# Patient Record
Sex: Female | Born: 1935 | Race: Black or African American | Hispanic: No | Marital: Married | Smoking: Former smoker
Health system: Southern US, Community
[De-identification: ages and names within clinical notes are randomized; demographics above are authoritative.]

## PROBLEM LIST (undated history)

## (undated) DIAGNOSIS — I10 Essential (primary) hypertension: Secondary | ICD-10-CM

## (undated) DIAGNOSIS — I509 Heart failure, unspecified: Secondary | ICD-10-CM

## (undated) DIAGNOSIS — E039 Hypothyroidism, unspecified: Secondary | ICD-10-CM

## (undated) DIAGNOSIS — I2510 Atherosclerotic heart disease of native coronary artery without angina pectoris: Secondary | ICD-10-CM

## (undated) HISTORY — PX: OTHER SURGICAL HISTORY: SHX169

## (undated) HISTORY — PX: CARDIAC CATHETERIZATION: SHX172

## (undated) HISTORY — PX: COLON SURGERY: SHX602

---

## 2007-11-09 HISTORY — PX: CORONARY ARTERY BYPASS GRAFT: SHX141

## 2014-01-17 DIAGNOSIS — J309 Allergic rhinitis, unspecified: Secondary | ICD-10-CM | POA: Insufficient documentation

## 2014-01-17 DIAGNOSIS — J45909 Unspecified asthma, uncomplicated: Secondary | ICD-10-CM | POA: Insufficient documentation

## 2014-02-11 DIAGNOSIS — Z951 Presence of aortocoronary bypass graft: Secondary | ICD-10-CM | POA: Insufficient documentation

## 2015-10-10 DIAGNOSIS — M545 Low back pain, unspecified: Secondary | ICD-10-CM | POA: Insufficient documentation

## 2016-03-25 ENCOUNTER — Other Ambulatory Visit: Payer: Self-pay | Admitting: Family Medicine

## 2016-03-25 DIAGNOSIS — E2839 Other primary ovarian failure: Secondary | ICD-10-CM

## 2016-04-14 ENCOUNTER — Ambulatory Visit
Admission: RE | Admit: 2016-04-14 | Discharge: 2016-04-14 | Disposition: A | Discharge status: Home / Self Care | Payer: Medicare Other | Source: Ambulatory Visit | Attending: Family Medicine | Admitting: Family Medicine

## 2016-04-14 DIAGNOSIS — M8580 Other specified disorders of bone density and structure, unspecified site: Secondary | ICD-10-CM | POA: Insufficient documentation

## 2016-04-14 DIAGNOSIS — E2839 Other primary ovarian failure: Secondary | ICD-10-CM

## 2016-05-28 ENCOUNTER — Ambulatory Visit: Payer: Medicare Other | Attending: Family Medicine | Admitting: Physical Therapy

## 2016-05-28 ENCOUNTER — Encounter: Payer: Self-pay | Admitting: Physical Therapy

## 2016-05-28 DIAGNOSIS — M5441 Lumbago with sciatica, right side: Secondary | ICD-10-CM | POA: Diagnosis not present

## 2016-05-28 DIAGNOSIS — R262 Difficulty in walking, not elsewhere classified: Secondary | ICD-10-CM

## 2016-05-28 NOTE — Therapy (Signed)
[facility] [address] [place], [place], [address] Phone: [phone]   Fax:  [phone]  Physical Therapy Evaluation  Patient Details  Name: Katie MorinMary Agnes Foot MRN: [id number] Date of Birth: [birth year] Referring Provider: Lanelle BalK. Briscoe  Encounter Date: 05/28/2016      PT End of Session - 05/28/16 0911    Visit Number 1   Date for PT Re-Evaluation 07/29/16   PT Start Time 0845   PT Stop Time 0938   PT Time Calculation (min) 53 min   Activity Tolerance Patient tolerated treatment well   Behavior During Therapy [facility] for tasks assessed/performed      History reviewed. No pertinent past medical history.  History reviewed. No pertinent past surgical history.  There were no vitals filed for this visit.       Subjective Assessment - 05/28/16 0848    Subjective Patient reports low back pain for about a year.  She reports a "slipped disc".  She reports her husband was having health issues and she was lifting him out of the car and hurt her back.     Limitations Lifting;Standing;Walking   How long can you stand comfortably? 5 minutes   How long can you walk comfortably? 1 minute, reports had to rest to walk 100 feet   Diagnostic tests x-rays   Patient Stated Goals have less pain   Currently in Pain? Yes   Pain Score 4    Pain Location Back   Pain Orientation Right;Lower   Pain Descriptors / Indicators Aching;Spasm;Sore   Pain Type Chronic pain   Pain Onset More than a month ago   Pain Frequency Constant   Aggravating Factors  standing, walking, doing ADL's pain will go to an 8/10   Pain Relieving Factors pain meds and heat, at best pain a 3/10   Effect of Pain on Daily Activities limits walking            [facility] PT Assessment - 05/28/16 0001    Assessment   Medical Diagnosis low back pain, difficulty walking   Referring Provider K. Briscoe   Onset Date/Surgical Date 04/28/16   Prior Therapy no   Precautions    Precautions None   Balance Screen   Has the patient fallen in the past 6 months No   Has the patient had a decrease in activity level because of a fear of falling?  No   Is the patient reluctant to leave their home because of a fear of falling?  No   Home Environment   Additional Comments does her own housework   Prior Function   Level of Independence Independent   Vocation Retired   Leisure no exercise   Posture/Postural Control   Posture Comments forward flexed trunk, right lateral shift which is the side of pain   ROM / Strength   AROM / PROM / Strength AROM;Strength   AROM   Overall AROM Comments lumbar ROM decreased 50% flexion and side bending, decreased 100% for extension, all motions increase pain   Strength   Overall Strength Comments 4-/5 with some pain   Flexibility   Soft Tissue Assessment /Muscle Length --  some HS and piriformis tightness   Palpation   Palpation comment she has significant spasms in the lumbar area, she is tender here and into the buttock and right SI, some tender in the right lateral thigh   Ambulation/Gait   Gait Comments walks without device, tends to use walls  to hold onto, forward flexed and right trunk lean, max walking distance is about 60 feet before she has to rest                   [facility] Adult PT Treatment/Exercise - 2016/06/02 0001    Modalities   Modalities Electrical Stimulation;Moist Heat   Moist Heat Therapy   Number Minutes Moist Heat 15 Minutes   Moist Heat Location Lumbar Spine   Electrical Stimulation   Electrical Stimulation Location lumbar spine   Electrical Stimulation Action IFC   Electrical Stimulation Parameters supine   Electrical Stimulation Goals Pain                PT Education - Jun 02, 2016 0910    Education provided Yes   Education Details Wms flexion exercises    Person(s) Educated Patient   Methods Explanation;Demonstration;Handout   Comprehension Verbalized understanding          PT  Short Term Goals - June 02, 2016 0913    PT SHORT TERM GOAL #1   Title independent with initial HEP   Time 2   Period Weeks   Status New           PT Long Term Goals - 02-Jun-2016 0913    PT LONG TERM GOAL #1   Title understand proper posture and body mechanics   Time 8   Period Weeks   Status New   PT LONG TERM GOAL #2   Title decrease pain 50%   Time 8   Period Weeks   Status New   PT LONG TERM GOAL #3   Title increase lumbar ROM 25%   Time 8   Period Weeks   Status New   PT LONG TERM GOAL #4   Title walk 200 feet without rest   Time 8   Period Weeks   Status New               Plan - 2016-06-02 0911    Clinical Impression Statement Patient has had low back pain for about a year after helping husband get out of the car.  She reports a "slipped disc".  She has significant mm spasms in the lumbar area and into the buttocks.  Reports difficulty walking > 60 feet.  Has forward flexed trunk and right shift   Rehab Potential Good   PT Frequency 2x / week   PT Duration 8 weeks   PT Treatment/Interventions ADLs/Self Care Home Management;Cryotherapy;Electrical Stimulation;Moist Heat;Therapeutic exercise;Therapeutic activities;Gait training;Ultrasound;Traction;Neuromuscular re-education;Patient/family education;Manual techniques   PT Next Visit Plan slowly add exercises and gait   Consulted and Agree with Plan of Care Patient      Patient will benefit from skilled therapeutic intervention in order to improve the following deficits and impairments:  Abnormal gait, Decreased activity tolerance, Decreased mobility, Decreased range of motion, Difficulty walking, Decreased strength, Impaired flexibility, Increased muscle spasms, Pain, Improper body mechanics, Postural dysfunction  Visit Diagnosis: Right-sided low back pain with right-sided sciatica - Plan: PT plan of care cert/re-cert  Difficulty in walking, not elsewhere classified - Plan: PT plan of care cert/re-cert       G-Codes - 06-02-16 0917    Functional Assessment Tool Used foto 75% limitation   Functional Limitation Other PT primary   Other PT Primary Current Status (Z6109) At least 60 percent but less than 80 percent impaired, limited or restricted   Other PT Primary Goal Status (U0454) At least 40 percent but less than 60 percent impaired, limited or  restricted       Problem List There are no active problems to display for this patient.   Jearld Lesch., PT 05/28/2016, 9:41 AM  [facility] [address] [place], [place], [address] Phone: [phone]   Fax:  [phone]  Name: Cristy Colmenares MRN: [id number] Date of Birth: 1933/03/16

## 2016-05-31 ENCOUNTER — Ambulatory Visit: Payer: Medicare Other | Admitting: Physical Therapy

## 2016-05-31 ENCOUNTER — Encounter: Payer: Self-pay | Admitting: Physical Therapy

## 2016-05-31 DIAGNOSIS — M5441 Lumbago with sciatica, right side: Secondary | ICD-10-CM | POA: Diagnosis not present

## 2016-05-31 DIAGNOSIS — R262 Difficulty in walking, not elsewhere classified: Secondary | ICD-10-CM

## 2016-05-31 NOTE — Therapy (Signed)
[facility] [place] [address] [place], [place], [address] Phone: [phone]   Fax:  [phone]  Physical Therapy Treatment  Patient Details  Name: Katie [name] MRN: [id number] Date of Birth: Jun 07, 1933 Referring Provider: Lanelle Bal  Encounter Date: 05/31/2016      PT End of Session - 05/31/16 0847    Visit Number 2   Date for PT Re-Evaluation 07/29/16   PT Start Time 0800   PT Stop Time 0900   PT Time Calculation (min) 60 min   Activity Tolerance Patient tolerated treatment well   Behavior During Therapy [facility] for tasks assessed/performed      History reviewed. No pertinent past medical history.  History reviewed. No pertinent surgical history.  There were no vitals filed for this visit.      Subjective Assessment - 05/31/16 0803    Subjective "Im having a little trouble with the last exercise" "Im learning how to move myself without hurting my back"   Currently in Pain? Yes   Pain Score 8    Pain Location Hip   Pain Orientation Right                         [facility] Adult PT Treatment/Exercise - 05/31/16 0001      Lumbar Exercises: Stretches   Passive Hamstring Stretch 4 reps;10 seconds   Double Knee to Chest Stretch 3 reps;10 seconds   Lower Trunk Rotation 2 reps   Piriformis Stretch 3 reps;10 seconds     Lumbar Exercises: Aerobic   Stationary Bike NuStep L2 x6 min      Lumbar Exercises: Supine   Ab Set 10 reps;2 seconds   Clam 10 reps   Clam Limitations red Tband    Bridge 5 reps;2 seconds   Straight Leg Raise 10 reps;1 second   Other Supine Lumbar Exercises Hooklying with ball squeeze x10     Modalities   Modalities Electrical Stimulation;Moist Heat     Moist Heat Therapy   Number Minutes Moist Heat 15 Minutes   Moist Heat Location Lumbar Spine     Electrical Stimulation   Electrical Stimulation Location lumbar spine   Electrical Stimulation Action IFC   Electrical  Stimulation Parameters supine   Electrical Stimulation Goals Pain                  PT Short Term Goals - 05/28/16 0913      PT SHORT TERM GOAL #1   Title independent with initial HEP   Time 2   Period Weeks   Status New           PT Long Term Goals - 05/28/16 0913      PT LONG TERM GOAL #1   Title understand proper posture and body mechanics   Time 8   Period Weeks   Status New     PT LONG TERM GOAL #2   Title decrease pain 50%   Time 8   Period Weeks   Status New     PT LONG TERM GOAL #3   Title increase lumbar ROM 25%   Time 8   Period Weeks   Status New     PT LONG TERM GOAL #4   Title walk 200 feet without rest   Time 8   Period Weeks   Status New               Plan - 05/31/16  1610    Clinical Impression Statement Pt tolerated today's interventions fair. Pt requires 4 rest breaks when on the nustep for 6 minutes. Pt with tight HS and piriformis on both sides. Very little ROM achieved with supine bridges due to weakness and pain. PT does not reports pain but facial expressions expressed otherwise during some of the supine interventions. Increase time needed to complete all of today's exercises.    Rehab Potential Good   PT Frequency 2x / week   PT Duration 8 weeks   PT Treatment/Interventions ADLs/Self Care Home Management;Cryotherapy;Electrical Stimulation;Moist Heat;Therapeutic exercise;Therapeutic activities;Gait training;Ultrasound;Traction;Neuromuscular re-education;Patient/family education;Manual techniques   PT Next Visit Plan slowly add exercises and gait      Patient will benefit from skilled therapeutic intervention in order to improve the following deficits and impairments:  Abnormal gait, Decreased activity tolerance, Decreased mobility, Decreased range of motion, Difficulty walking, Decreased strength, Impaired flexibility, Increased muscle spasms, Pain, Improper body mechanics, Postural dysfunction  Visit  Diagnosis: Right-sided low back pain with right-sided sciatica  Difficulty in walking, not elsewhere classified     Problem List There are no active problems to display for this patient.   Grayce Sessions 05/31/2016, 8:52 AM  [facility] [place] [address] [place], [place], [address] Phone: [phone]   Fax:  [phone]  Name: Katie [name] MRN: [id number] Date of Birth: 08-31-1933

## 2016-06-03 ENCOUNTER — Ambulatory Visit: Payer: Medicare Other | Admitting: Physical Therapy

## 2016-06-03 DIAGNOSIS — M5441 Lumbago with sciatica, right side: Secondary | ICD-10-CM

## 2016-06-03 DIAGNOSIS — R262 Difficulty in walking, not elsewhere classified: Secondary | ICD-10-CM

## 2016-06-03 NOTE — Therapy (Signed)
[place], [place], [address] Phone: [phone]   Fax:  [phone]  Physical Therapy Treatment  Patient Details  Name: Katie [name] MRN: [id number] Date of Birth: 21-Sep-1933 Referring Provider: Henrene Hawking  Encounter Date: 06/03/2016      PT End of Session - 06/03/16 0934    Visit Number 3   Date for PT Re-Evaluation 07/29/16   PT Start Time 0845   PT Stop Time 0947   PT Time Calculation (min) 62 min   Activity Tolerance Patient tolerated treatment well   Behavior During Therapy [facility] for tasks assessed/performed      No past medical history on file.  No past surgical history on file.  There were no vitals filed for this visit.      Subjective Assessment - 06/03/16 0845    Subjective Feeling fine but the usual pain is apparent. Reports that after last visit she felt as though it was a good work out.    Limitations Lifting;Standing;Walking   How long can you stand comfortably? 5 minutes   How long can you walk comfortably? 1 minute, reports had to rest to walk 100 feet   Diagnostic tests x-rays   Patient Stated Goals have less pain   Pain Score 7    Pain Location Back   Pain Orientation Lower                         [facility] Adult PT Treatment/Exercise - 06/03/16 0001      Lumbar Exercises: Stretches   Passive Hamstring Stretch 3 reps;10 seconds   Double Knee to Chest Stretch 3 reps;10 seconds   Lower Trunk Rotation 4 reps   Piriformis Stretch 3 reps;10 seconds     Lumbar Exercises: Aerobic   Stationary Bike NuStep L2 x6 min      Lumbar Exercises: Supine   Ab Set 10 reps;2 seconds   Bridge 5 reps;2 seconds   Straight Leg Raise 10 reps;1 second     Knee/Hip Exercises: Seated   Ball Squeeze 2x10   Abduction/Adduction  Strengthening;Both;2 sets;10 reps     Shoulder Exercises: Seated   Retraction Strengthening;Both;Theraband;10 reps   Theraband Level  (Shoulder Retraction) Level 1 (Yellow)   Horizontal ABduction Strengthening;10 reps     Modalities   Modalities Electrical Stimulation     Moist Heat Therapy   Number Minutes Moist Heat 15 Minutes   Moist Heat Location Lumbar Spine     Electrical Stimulation   Electrical Stimulation Location lumbar spine   Electrical Stimulation Action IFC   Electrical Stimulation Parameters supine   Electrical Stimulation Goals Pain                  PT Short Term Goals - 06/03/16 0942      PT SHORT TERM GOAL #1   Title independent with initial HEP   Time 2   Period Weeks   Status Partially Met           PT Long Term Goals - 05/28/16 0913      PT LONG TERM GOAL #1   Title understand proper posture and body mechanics   Time 8   Period Weeks   Status New     PT LONG TERM GOAL #2   Title decrease pain 50%   Time 8   Period Weeks   Status New     PT LONG TERM GOAL #  3   Title increase lumbar ROM 25%   Time 8   Period Weeks   Status New     PT LONG TERM GOAL #4   Title walk 200 feet without rest   Time 8   Period Weeks   Status New               Plan - 06/03/16 9969    Clinical Impression Statement Pt only required one rest break on Nustep today during 6 minute warm up. Pt HS and Piriformis very tight bilaterally. ROM during supine bridges was still very limited due to pain and weakness. Pt tolerated added tband exercises to foster improved posture.    Rehab Potential Good   PT Frequency 2x / week   PT Duration 8 weeks   PT Treatment/Interventions ADLs/Self Care Home Management;Cryotherapy;Electrical Stimulation;Moist Heat;Therapeutic exercise;Therapeutic activities;Gait training;Ultrasound;Traction;Neuromuscular re-education;Patient/family education;Manual techniques   PT Next Visit Plan Continue to work on increasing ROM for bridges in supine and increasing flexibility. Implement treadmill next visit to foster improving gait and endurance.        Patient will benefit from skilled therapeutic intervention in order to improve the following deficits and impairments:  Abnormal gait, Decreased activity tolerance, Decreased mobility, Decreased range of motion, Difficulty walking, Decreased strength, Impaired flexibility, Increased muscle spasms, Pain, Improper body mechanics, Postural dysfunction  Visit Diagnosis: Right-sided low back pain with right-sided sciatica  Difficulty in walking, not elsewhere classified     Problem List There are no active problems to display for this patient.   Dallie Piles, SPTA 06/03/2016, 9:44 AM  [place], [place], [address] Phone: [phone]   Fax:  [phone]  Name: Katie [name] MRN: [id number] Date of Birth: June 10, 1933

## 2016-06-10 ENCOUNTER — Ambulatory Visit: Payer: Medicare Other | Attending: Family Medicine | Admitting: Physical Therapy

## 2016-06-10 ENCOUNTER — Encounter: Payer: Self-pay | Admitting: Physical Therapy

## 2016-06-10 DIAGNOSIS — M5441 Lumbago with sciatica, right side: Secondary | ICD-10-CM | POA: Diagnosis present

## 2016-06-10 DIAGNOSIS — R262 Difficulty in walking, not elsewhere classified: Secondary | ICD-10-CM | POA: Insufficient documentation

## 2016-06-10 NOTE — Therapy (Signed)
[place], [place], [address] Phone: [phone]   Fax:  [phone]  Physical Therapy Treatment  Patient Details  Name: Katie [name] MRN: [id number] Date of Birth: December 31, 1932 Referring Provider: Henrene Hawking  Encounter Date: 06/10/2016      PT End of Session - 06/10/16 0955    Visit Number 4   Date for PT Re-Evaluation 07/29/16   PT Start Time 0932   PT Stop Time 1009   PT Time Calculation (min) 37 min   Activity Tolerance Patient limited by pain   Behavior During Therapy [facility] for tasks assessed/performed      History reviewed. No pertinent past medical history.  History reviewed. No pertinent surgical history.  There were no vitals filed for this visit.      Subjective Assessment - 06/10/16 0933    Subjective "I was so excited that I could do exercise" Pt reports that she washed the kitchen and both bathrooms. Pt also reported that she vacuumed the dinning room floor this weekend. Pt reports that she may have over did it because she hasn't been able to do anything for the last three days. Pt stated that she had an x-ray taken that was negative,   Pain Score 8    Pain Location Back   Pain Orientation Right                         [facility] Adult PT Treatment/Exercise - 06/10/16 0001      Modalities   Modalities Electrical Stimulation     Moist Heat Therapy   Number Minutes Moist Heat 15 Minutes   Moist Heat Location Lumbar Spine     Electrical Stimulation   Electrical Stimulation Location lumbar spine   Electrical Stimulation Action IFC   Electrical Stimulation Parameters supine   Electrical Stimulation Goals Pain                  PT Short Term Goals - 06/03/16 0942      PT SHORT TERM GOAL #1   Title independent with initial HEP   Time 2   Period Weeks   Status Partially Met           PT Long Term Goals - 05/28/16 0913      PT LONG TERM  GOAL #1   Title understand proper posture and body mechanics   Time 8   Period Weeks   Status New     PT LONG TERM GOAL #2   Title decrease pain 50%   Time 8   Period Weeks   Status New     PT LONG TERM GOAL #3   Title increase lumbar ROM 25%   Time 8   Period Weeks   Status New     PT LONG TERM GOAL #4   Title walk 200 feet without rest   Time 8   Period Weeks   Status New               Plan - 06/10/16 0941    Clinical Impression Statement Pt reports that her pain causes her BP to rise, Pre treatment BP 172/90 after waiting ~ 5 minutes BP raised to  183/96. No interventions performed this date, only pain management. Pt reports that the MD did write her a prescription for a can but she has not gotten it yet. Pt reports no balance issues but states she  is limited when she goes out. Informed pt that we could start practicing with a cane in PT.   Rehab Potential Good   PT Frequency 2x / week   PT Duration 8 weeks   PT Treatment/Interventions ADLs/Self Care Home Management;Cryotherapy;Electrical Stimulation;Moist Heat;Therapeutic exercise;Therapeutic activities;Gait training;Ultrasound;Traction;Neuromuscular re-education;Patient/family education;Manual techniques   PT Next Visit Plan assess tx, Continue to work on increasing ROM for bridges in supine and increasing flexibility. Implement treadmill next visit to foster improving gait and endurance.       Patient will benefit from skilled therapeutic intervention in order to improve the following deficits and impairments:  Abnormal gait, Decreased activity tolerance, Decreased mobility, Decreased range of motion, Difficulty walking, Decreased strength, Impaired flexibility, Increased muscle spasms, Pain, Improper body mechanics, Postural dysfunction  Visit Diagnosis: Right-sided low back pain with right-sided sciatica  Difficulty in walking, not elsewhere classified     Problem List There are no active problems to  display for this patient.   Scot Jun, PTA  06/10/2016, 9:59 AM  [place], [place], [address] Phone: [phone]   Fax:  [phone]  Name: Deitra Craine MRN: [id number] Date of Birth: 12-28-1932

## 2016-06-17 ENCOUNTER — Ambulatory Visit: Payer: Medicare Other | Admitting: Physical Therapy

## 2016-06-17 DIAGNOSIS — M5441 Lumbago with sciatica, right side: Secondary | ICD-10-CM

## 2016-06-17 DIAGNOSIS — R262 Difficulty in walking, not elsewhere classified: Secondary | ICD-10-CM

## 2016-06-17 NOTE — Therapy (Signed)
[place], [place], [address] Phone: [phone]   Fax:  [phone]  Physical Therapy Treatment  Patient Details  Name: Katie [name] MRN: [id number] Date of Birth: 06-29-1933 Referring Provider: Henrene Hawking  Encounter Date: 06/17/2016      PT End of Session - 06/17/16 1011    Visit Number 5   Date for PT Re-Evaluation 07/29/16   PT Start Time 0931   PT Stop Time 1014   PT Time Calculation (min) 43 min   Activity Tolerance Patient tolerated treatment well   Behavior During Therapy [facility] for tasks assessed/performed      No past medical history on file.  No past surgical history on file.  There were no vitals filed for this visit.      Subjective Assessment - 06/17/16 0934    Subjective "Im ok, I haven't done anything but cook. The back is ok its the thigh"   Currently in Pain? Yes   Pain Score 4    Pain Location Leg   Pain Orientation Right            [facility] PT Assessment - 06/17/16 0001      AROM   Overall AROM Comments lumbar ROM decreased 75% for extension, all other motions [facility]     Strength   Overall Strength Comments 4/5 Hips and HS, 5/5 knee extensors                     [facility] Adult PT Treatment/Exercise - 06/17/16 0001      Ambulation/Gait   Gait Comments Walked with pt outside > 200 feet without rest. Before returning pt required one seated rest break c/o fatigue and pain on lateral side of RLE     Lumbar Exercises: Aerobic   Stationary Bike NuStep L3 x6 min      Knee/Hip Exercises: Stretches   Passive Hamstring Stretch Both;3 reps;10 seconds   Piriformis Stretch Both;3 reps;10 seconds     Knee/Hip Exercises: Standing   Hip Abduction Both;1 set;5 reps   Hip Extension Left;1 set;5 reps     Knee/Hip Exercises: Seated   Ball Squeeze 2x10   Abduction/Adduction  Strengthening;Both;2 sets;10 reps   Abd/Adduction Limitations blue Tband                    PT Short Term Goals - 06/03/16 1950      PT SHORT TERM GOAL #1   Title independent with initial HEP   Time 2   Period Weeks   Status Partially Met           PT Long Term Goals - 06/17/16 1014      PT LONG TERM GOAL #1   Title understand proper posture and body mechanics   Status On-going     PT LONG TERM GOAL #2   Title decrease pain 50%   Status Achieved     PT LONG TERM GOAL #3   Title increase lumbar ROM 25%   Status Partially Met     PT LONG TERM GOAL #4   Title walk 200 feet without rest   Status Achieved               Plan - 06/17/16 1011    Clinical Impression Statement Pt has progressed and met 2 of her LTGs, Pt displayed the ability to walk greater than 200 meters without rest. Pt has also increased her lumbar  ROM. Reports some lateral R leg pain after walking that subsided after rest. Reports increase pain with standing hip abductions more so when LLE was active. Pt reports that her LBP has gone.    PT Frequency 2x / week   PT Duration 8 weeks   PT Treatment/Interventions ADLs/Self Care Home Management;Cryotherapy;Electrical Stimulation;Moist Heat;Therapeutic exercise;Therapeutic activities;Gait training;Ultrasound;Traction;Neuromuscular re-education;Patient/family education;Manual techniques   PT Next Visit Plan assess tx, Continue to work on increasing ROM for bridges in supine and increasing flexibility. Implement treadmill next visit to foster improving gait and endurance.       Patient will benefit from skilled therapeutic intervention in order to improve the following deficits and impairments:  Abnormal gait, Decreased activity tolerance, Decreased mobility, Decreased range of motion, Difficulty walking, Decreased strength, Impaired flexibility, Increased muscle spasms, Pain, Improper body mechanics, Postural dysfunction  Visit Diagnosis: Right-sided low back pain with right-sided sciatica  Difficulty in walking, not  elsewhere classified     Problem List There are no active problems to display for this patient.   Scot Jun, PTA  06/17/2016, 10:15 AM  [place], [place], [address] Phone: [phone]   Fax:  [phone]  Name: Katie [name] MRN: [id number] Date of Birth: 06-Mar-1933

## 2016-06-21 ENCOUNTER — Ambulatory Visit: Payer: Medicare Other | Admitting: Physical Therapy

## 2016-06-21 DIAGNOSIS — M5441 Lumbago with sciatica, right side: Secondary | ICD-10-CM | POA: Diagnosis not present

## 2016-06-21 DIAGNOSIS — R262 Difficulty in walking, not elsewhere classified: Secondary | ICD-10-CM

## 2016-06-21 NOTE — Therapy (Signed)
[facility] [address] [facility] [address] [address] [place], [place], [address] Phone: [phone]   Fax:  [phone]  Physical Therapy Treatment  Patient Details  Name: Katie [name] MRN: [id number] Date of Birth: 1933-01-25 Referring Provider: Lanelle Bal  Encounter Date: 06/21/2016      PT End of Session - 06/21/16 0938    Visit Number 6   Date for PT Re-Evaluation 07/29/16   PT Start Time 0845   PT Stop Time 0952   PT Time Calculation (min) 67 min   Activity Tolerance Patient tolerated treatment well   Behavior During Therapy [facility] for tasks assessed/performed      No past medical history on file.  No past surgical history on file.  There were no vitals filed for this visit.      Subjective Assessment - 06/21/16 0848    Subjective Doing okay, pain still there but tolerable in thigh.   Limitations Lifting;Standing;Walking   How long can you stand comfortably? 5 minutes   Currently in Pain? Yes   Pain Score 5    Pain Location Leg   Pain Orientation Right;Upper                         [facility] Adult PT Treatment/Exercise - 06/21/16 0001      Lumbar Exercises: Aerobic   Stationary Bike Nustep L4 x18min   Tread Mill No incline, speed 1.2 x11min, emphasis on good posture, not gripping hard to hand rails     Lumbar Exercises: Supine   Bridge 15 reps   Straight Leg Raise 10 reps   Straight Leg Raises Limitations From Green pball   Other Supine Lumbar Exercises Trunk rotations x5, HS curls with feet on green pball     Knee/Hip Exercises: Standing   Hip Flexion Stengthening;10 reps;Knee straight   Hip Flexion Limitations Red tband   Hip ADduction Both;10 reps   Hip ADduction Limitations Red tband   Hip Abduction Both;10 reps;Knee straight   Abduction Limitations Red tband   Hip Extension Both;10 reps;Knee straight   Extension Limitations Red tband   Wall Squat 10 reps   Wall Squat Limitations With red pball  behind back   Other Standing Knee Exercises OHP with sit to stand with yellow weighted ball x10     Knee/Hip Exercises: Seated   Ball Squeeze 2x15     Modalities   Modalities Electrical Stimulation     Moist Heat Therapy   Number Minutes Moist Heat 15 Minutes   Moist Heat Location Lumbar Spine     Electrical Stimulation   Electrical Stimulation Location Lumbar Spine   Electrical Stimulation Action IFC   Electrical Stimulation Parameters Supine   Electrical Stimulation Goals Pain                  PT Short Term Goals - 06/03/16 0278      PT SHORT TERM GOAL #1   Title independent with initial HEP   Time 2   Period Weeks   Status Partially Met           PT Long Term Goals - 06/17/16 1014      PT LONG TERM GOAL #1   Title understand proper posture and body mechanics   Status On-going     PT LONG TERM GOAL #2   Title decrease pain 50%   Status Achieved     PT LONG TERM GOAL #3   Title  increase lumbar ROM 25%   Status Partially Met     PT LONG TERM GOAL #4   Title walk 200 feet without rest   Status Achieved               Plan - 06/21/16 0939    Clinical Impression Statement Pt had slight increase in motion with supine bridges today but required cueing for proper form. Pt reported that R posterior thigh started having spasms towards end of reps during standing 4-way hip.    Rehab Potential Good   PT Frequency 2x / week   PT Duration 8 weeks   PT Treatment/Interventions ADLs/Self Care Home Management;Cryotherapy;Electrical Stimulation;Moist Heat;Therapeutic exercise;Therapeutic activities;Gait training;Ultrasound;Traction;Neuromuscular re-education;Patient/family education;Manual techniques   PT Next Visit Plan Continue to work on ROM for bridges in supine, strengthen glutes and work on improving endurance and walking speed.       Patient will benefit from skilled therapeutic intervention in order to improve the following deficits and  impairments:  Abnormal gait, Decreased activity tolerance, Decreased mobility, Decreased range of motion, Difficulty walking, Decreased strength, Impaired flexibility, Increased muscle spasms, Pain, Improper body mechanics, Postural dysfunction  Visit Diagnosis: Right-sided low back pain with right-sided sciatica  Difficulty in walking, not elsewhere classified     Problem List There are no active problems to display for this patient.   Dallie Piles, SPTA 06/21/2016, 9:51 AM  [place] [address] [place], [place], [address] Phone: [phone]   Fax:  [phone]  Name: Katie [name] MRN: [id number] Date of Birth: 1932/12/03

## 2016-06-28 ENCOUNTER — Encounter: Payer: Self-pay | Admitting: Physical Therapy

## 2016-06-28 ENCOUNTER — Ambulatory Visit: Payer: Medicare Other | Admitting: Physical Therapy

## 2016-06-28 DIAGNOSIS — R262 Difficulty in walking, not elsewhere classified: Secondary | ICD-10-CM

## 2016-06-28 DIAGNOSIS — M5441 Lumbago with sciatica, right side: Secondary | ICD-10-CM | POA: Diagnosis not present

## 2016-06-28 NOTE — Therapy (Signed)
[place], [place], [address] Phone: [phone]   Fax:  [phone]  Physical Therapy Treatment  Patient Details  Name: Katie [name] MRN: [id number] Date of Birth: 05-Nov-1933 Referring Provider: Henrene Hawking  Encounter Date: 06/28/2016      PT End of Session - 06/28/16 0924    Visit Number 7   Date for PT Re-Evaluation 07/29/16   PT Start Time 0845   PT Stop Time 0926   PT Time Calculation (min) 41 min   Activity Tolerance Patient tolerated treatment well   Behavior During Therapy [facility] for tasks assessed/performed      History reviewed. No pertinent past medical history.  History reviewed. No pertinent surgical history.  There were no vitals filed for this visit.      Subjective Assessment - 06/28/16 0848    Subjective "No pain just a little stiff, the only day I didn't exercise was yesterday"   Currently in Pain? No/denies  "No pain just stiff"                         [facility] Adult PT Treatment/Exercise - 06/28/16 0001      Lumbar Exercises: Aerobic   Stationary Bike Nustep L2 x 55mn     Lumbar Exercises: Seated   Long Arc Quad on Chair 2 sets;10 reps   LAQ on Chair Weights (lbs) 3   Sit to Stand 10 reps  x2, no ue support      Knee/Hip Exercises: Standing   Other Standing Knee Exercises Marching 2x10      Knee/Hip Exercises: Seated   Ball Squeeze 2x15   Hamstring Curl 2 sets;10 reps   Hamstring Limitations red Tband    Abduction/Adduction  Strengthening;Both;2 sets;10 reps   Abd/Adduction Limitations blue Tband                   PT Short Term Goals - 06/03/16 03343     PT SHORT TERM GOAL #1   Title independent with initial HEP   Time 2   Period Weeks   Status Partially Met           PT Long Term Goals - 06/28/16 0856      PT LONG TERM GOAL #1   Title understand proper posture and body mechanics   Status Achieved                Plan - 06/28/16 0925    Clinical Impression Statement Pt reports that she has been having less muscle spasms and the circulation in her feet has improved. She reports no pain only stiffness, low back stiffness more prominent with standing march. Pt with a decrease activity tolerance requiring frequent rest breaks.    Rehab Potential Good   PT Frequency 2x / week   PT Duration 8 weeks   PT Treatment/Interventions ADLs/Self Care Home Management;Cryotherapy;Electrical Stimulation;Moist Heat;Therapeutic exercise;Therapeutic activities;Gait training;Ultrasound;Traction;Neuromuscular re-education;Patient/family education;Manual techniques   PT Next Visit Plan Continue to work on ROM for bridges in supine, strengthen glutes and work on improving endurance and walking speed.       Patient will benefit from skilled therapeutic intervention in order to improve the following deficits and impairments:  Abnormal gait, Decreased activity tolerance, Decreased mobility, Decreased range of motion, Difficulty walking, Decreased strength, Impaired flexibility, Increased muscle spasms, Pain, Improper body mechanics, Postural dysfunction  Visit Diagnosis: Right-sided low back pain with right-sided sciatica  Difficulty in walking, not elsewhere classified     Problem List There are no active problems to display for this patient.   Scot Jun, PTA  06/28/2016, 9:27 AM  [place], [place], [address] Phone: [phone]   Fax:  [phone]  Name: Katie [name] MRN: [id number] Date of Birth: 24-Jan-1933

## 2016-07-05 ENCOUNTER — Encounter: Payer: Self-pay | Admitting: Physical Therapy

## 2016-07-05 ENCOUNTER — Ambulatory Visit: Payer: Medicare Other | Admitting: Physical Therapy

## 2016-07-05 DIAGNOSIS — M5441 Lumbago with sciatica, right side: Secondary | ICD-10-CM | POA: Diagnosis not present

## 2016-07-05 DIAGNOSIS — R262 Difficulty in walking, not elsewhere classified: Secondary | ICD-10-CM

## 2016-07-05 NOTE — Therapy (Signed)
[place], [place], [address] Phone: [phone]   Fax:  [phone]  Physical Therapy Treatment  Patient Details  Name: Katie [name] MRN: [id number] Date of Birth: Jul 14, 1933 Referring Provider: Henrene Hawking  Encounter Date: 07/05/2016      PT End of Session - 07/05/16 0921    Visit Number 8   Date for PT Re-Evaluation 07/29/16   PT Start Time 0841   PT Stop Time 0923   PT Time Calculation (min) 42 min   Activity Tolerance Patient tolerated treatment well   Behavior During Therapy [facility] for tasks assessed/performed      History reviewed. No pertinent past medical history.  History reviewed. No pertinent surgical history.  There were no vitals filed for this visit.      Subjective Assessment - 07/05/16 0843    Subjective "Im feeling pretty good"   Currently in Pain? No/denies   Pain Score 0-No pain            [facility] PT Assessment - 07/05/16 0001      AROM   Overall AROM Comments lumbar ROM decreased 50% for extension, all other motions [facility]                     [facility] Adult PT Treatment/Exercise - 07/05/16 0001      Lumbar Exercises: Aerobic   Stationary Bike Nustep L2 x 75mn     Lumbar Exercises: Seated   Long Arc Quad on Chair 2 sets;10 reps   LAQ on Chair Weights (lbs) 3   Sit to Stand 10 reps  x2 holding yellow ball      Knee/Hip Exercises: Standing   Other Standing Knee Exercises Marching 2x10   3lb     Knee/Hip Exercises: Seated   Ball Squeeze 2x15   Hamstring Curl 2 sets;10 reps   Hamstring Limitations red Tband    Abduction/Adduction  Strengthening;Both;2 sets;10 reps   Abd/Adduction Limitations blue Tband                   PT Short Term Goals - 06/03/16 03244     PT SHORT TERM GOAL #1   Title independent with initial HEP   Time 2   Period Weeks   Status Partially Met           PT Long Term Goals - 07/05/16 00102     PT  LONG TERM GOAL #1   Title understand proper posture and body mechanics   Status Achieved     PT LONG TERM GOAL #2   Title decrease pain 50%   Status Achieved     PT LONG TERM GOAL #3   Title increase lumbar ROM 25%   Status Partially Met     PT LONG TERM GOAL #4   Title walk 200 feet without rest   Status Achieved               Plan - 07/05/16 0922    Clinical Impression Statement Pr has progressed and met most of her LTG's, but remains limited with Lumbar extension. Tolerated all exercises well, does reports a pulling sensation with seated hip abd/add   Rehab Potential Good   PT Frequency 2x / week   PT Duration 8 weeks   PT Treatment/Interventions ADLs/Self Care Home Management;Cryotherapy;Electrical Stimulation;Moist Heat;Therapeutic exercise;Therapeutic activities;Gait training;Ultrasound;Traction;Neuromuscular re-education;Patient/family education;Manual techniques   PT Next Visit Plan spoke with lead PT. Will  place pt on 2 week hold. Pt will call if she has a decline in function to schedule more appointments. Will D/C if no call within two weeks.      Patient will benefit from skilled therapeutic intervention in order to improve the following deficits and impairments:  Abnormal gait, Decreased activity tolerance, Decreased mobility, Decreased range of motion, Difficulty walking, Decreased strength, Impaired flexibility, Increased muscle spasms, Pain, Improper body mechanics, Postural dysfunction  Visit Diagnosis: Right-sided low back pain with right-sided sciatica  Difficulty in walking, not elsewhere classified     Problem List There are no active problems to display for this patient.   Scot Jun, PTA  07/05/2016, 9:25 AM  [place], [place], [address] Phone: [phone]   Fax:  [phone]  Name: Katie [name] MRN: [id number] Date of Birth: 02/20/1933

## 2016-07-26 ENCOUNTER — Telehealth: Payer: Self-pay | Admitting: Physical Therapy

## 2017-06-06 DIAGNOSIS — R252 Cramp and spasm: Secondary | ICD-10-CM | POA: Insufficient documentation

## 2017-12-18 ENCOUNTER — Inpatient Hospital Stay (HOSPITAL_COMMUNITY)
Admission: EM | Admit: 2017-12-18 | Discharge: 2017-12-29 | DRG: 391 | Disposition: A | Discharge status: Home / Self Care | Payer: Medicare HMO | Attending: Family Medicine | Admitting: Family Medicine

## 2017-12-18 ENCOUNTER — Encounter (HOSPITAL_COMMUNITY): Payer: Self-pay

## 2017-12-18 ENCOUNTER — Emergency Department (HOSPITAL_COMMUNITY): Payer: Medicare HMO

## 2017-12-18 ENCOUNTER — Other Ambulatory Visit: Payer: Self-pay

## 2017-12-18 DIAGNOSIS — N179 Acute kidney failure, unspecified: Secondary | ICD-10-CM | POA: Diagnosis not present

## 2017-12-18 DIAGNOSIS — I21A1 Myocardial infarction type 2: Secondary | ICD-10-CM | POA: Diagnosis not present

## 2017-12-18 DIAGNOSIS — Z87891 Personal history of nicotine dependence: Secondary | ICD-10-CM

## 2017-12-18 DIAGNOSIS — Z0189 Encounter for other specified special examinations: Secondary | ICD-10-CM

## 2017-12-18 DIAGNOSIS — R1084 Generalized abdominal pain: Secondary | ICD-10-CM

## 2017-12-18 DIAGNOSIS — N390 Urinary tract infection, site not specified: Secondary | ICD-10-CM | POA: Diagnosis present

## 2017-12-18 DIAGNOSIS — A084 Viral intestinal infection, unspecified: Principal | ICD-10-CM | POA: Diagnosis present

## 2017-12-18 DIAGNOSIS — E039 Hypothyroidism, unspecified: Secondary | ICD-10-CM | POA: Diagnosis present

## 2017-12-18 DIAGNOSIS — Z9049 Acquired absence of other specified parts of digestive tract: Secondary | ICD-10-CM

## 2017-12-18 DIAGNOSIS — E785 Hyperlipidemia, unspecified: Secondary | ICD-10-CM | POA: Diagnosis present

## 2017-12-18 DIAGNOSIS — I739 Peripheral vascular disease, unspecified: Secondary | ICD-10-CM | POA: Diagnosis present

## 2017-12-18 DIAGNOSIS — Z91013 Allergy to seafood: Secondary | ICD-10-CM

## 2017-12-18 DIAGNOSIS — R10817 Generalized abdominal tenderness: Secondary | ICD-10-CM

## 2017-12-18 DIAGNOSIS — E860 Dehydration: Secondary | ICD-10-CM | POA: Diagnosis present

## 2017-12-18 DIAGNOSIS — R112 Nausea with vomiting, unspecified: Secondary | ICD-10-CM

## 2017-12-18 DIAGNOSIS — I252 Old myocardial infarction: Secondary | ICD-10-CM

## 2017-12-18 DIAGNOSIS — I10 Essential (primary) hypertension: Secondary | ICD-10-CM | POA: Diagnosis not present

## 2017-12-18 DIAGNOSIS — I130 Hypertensive heart and chronic kidney disease with heart failure and stage 1 through stage 4 chronic kidney disease, or unspecified chronic kidney disease: Secondary | ICD-10-CM | POA: Diagnosis present

## 2017-12-18 DIAGNOSIS — I2510 Atherosclerotic heart disease of native coronary artery without angina pectoris: Secondary | ICD-10-CM | POA: Diagnosis present

## 2017-12-18 DIAGNOSIS — Z86010 Personal history of colonic polyps: Secondary | ICD-10-CM

## 2017-12-18 DIAGNOSIS — Z7982 Long term (current) use of aspirin: Secondary | ICD-10-CM

## 2017-12-18 DIAGNOSIS — K56609 Unspecified intestinal obstruction, unspecified as to partial versus complete obstruction: Secondary | ICD-10-CM

## 2017-12-18 DIAGNOSIS — N182 Chronic kidney disease, stage 2 (mild): Secondary | ICD-10-CM | POA: Diagnosis present

## 2017-12-18 DIAGNOSIS — Z7689 Persons encountering health services in other specified circumstances: Secondary | ICD-10-CM

## 2017-12-18 DIAGNOSIS — R079 Chest pain, unspecified: Secondary | ICD-10-CM

## 2017-12-18 DIAGNOSIS — R824 Acetonuria: Secondary | ICD-10-CM | POA: Diagnosis present

## 2017-12-18 DIAGNOSIS — I700 Atherosclerosis of aorta: Secondary | ICD-10-CM | POA: Diagnosis not present

## 2017-12-18 DIAGNOSIS — J449 Chronic obstructive pulmonary disease, unspecified: Secondary | ICD-10-CM | POA: Diagnosis present

## 2017-12-18 DIAGNOSIS — Z951 Presence of aortocoronary bypass graft: Secondary | ICD-10-CM

## 2017-12-18 DIAGNOSIS — Z79890 Hormone replacement therapy (postmenopausal): Secondary | ICD-10-CM

## 2017-12-18 DIAGNOSIS — R634 Abnormal weight loss: Secondary | ICD-10-CM | POA: Diagnosis present

## 2017-12-18 DIAGNOSIS — I5032 Chronic diastolic (congestive) heart failure: Secondary | ICD-10-CM | POA: Diagnosis present

## 2017-12-18 DIAGNOSIS — R52 Pain, unspecified: Secondary | ICD-10-CM

## 2017-12-18 DIAGNOSIS — Z91048 Other nonmedicinal substance allergy status: Secondary | ICD-10-CM

## 2017-12-18 DIAGNOSIS — R197 Diarrhea, unspecified: Secondary | ICD-10-CM

## 2017-12-18 HISTORY — DX: Hypothyroidism, unspecified: E03.9

## 2017-12-18 HISTORY — DX: Heart failure, unspecified: I50.9

## 2017-12-18 HISTORY — DX: Essential (primary) hypertension: I10

## 2017-12-18 HISTORY — DX: Atherosclerotic heart disease of native coronary artery without angina pectoris: I25.10

## 2017-12-18 LAB — URINALYSIS, ROUTINE W REFLEX MICROSCOPIC
Bilirubin Urine: NEGATIVE
GLUCOSE, UA: NEGATIVE mg/dL
Hgb urine dipstick: NEGATIVE
Ketones, ur: 5 mg/dL — AB
NITRITE: NEGATIVE
PH: 5 (ref 5.0–8.0)
PROTEIN: NEGATIVE mg/dL
SPECIFIC GRAVITY, URINE: 1.023 (ref 1.005–1.030)

## 2017-12-18 LAB — COMPREHENSIVE METABOLIC PANEL
ALBUMIN: 4.5 g/dL (ref 3.5–5.0)
ALK PHOS: 53 U/L (ref 38–126)
ALT: 20 U/L (ref 14–54)
ANION GAP: 11 (ref 5–15)
AST: 30 U/L (ref 15–41)
BILIRUBIN TOTAL: 0.5 mg/dL (ref 0.3–1.2)
BUN: 31 mg/dL — AB (ref 6–20)
CALCIUM: 10.6 mg/dL — AB (ref 8.9–10.3)
CO2: 25 mmol/L (ref 22–32)
Chloride: 99 mmol/L — ABNORMAL LOW (ref 101–111)
Creatinine, Ser: 2.1 mg/dL — ABNORMAL HIGH (ref 0.44–1.00)
GFR calc Af Amer: 24 mL/min — ABNORMAL LOW (ref >60)
GFR, EST NON AFRICAN AMERICAN: 21 mL/min — AB (ref >60)
GLUCOSE: 144 mg/dL — AB (ref 65–99)
Potassium: 4.2 mmol/L (ref 3.5–5.1)
Sodium: 135 mmol/L (ref 135–145)
TOTAL PROTEIN: 8.6 g/dL — AB (ref 6.5–8.1)

## 2017-12-18 LAB — CBC
HCT: 43.5 % (ref 36.0–46.0)
Hemoglobin: 15.1 g/dL — ABNORMAL HIGH (ref 12.0–15.0)
MCH: 31.1 pg (ref 26.0–34.0)
MCHC: 34.7 g/dL (ref 30.0–36.0)
MCV: 89.5 fL (ref 78.0–100.0)
Platelets: 271 10*3/uL (ref 150–400)
RBC: 4.86 MIL/uL (ref 3.87–5.11)
RDW: 13.2 % (ref 11.5–15.5)
WBC: 14 10*3/uL — AB (ref 4.0–10.5)

## 2017-12-18 LAB — GLUCOSE, CAPILLARY: Glucose-Capillary: 99 mg/dL (ref 65–99)

## 2017-12-18 LAB — LIPASE, BLOOD: Lipase: 47 U/L (ref 11–51)

## 2017-12-18 MED ORDER — PRAVASTATIN SODIUM 20 MG PO TABS
40.0000 mg | ORAL_TABLET | Freq: Every day | ORAL | Status: DC
  Administered 2017-12-18 – 2017-12-19 (×2): 40 mg via ORAL
  Filled 2017-12-18 (×3): qty 2

## 2017-12-18 MED ORDER — ONDANSETRON HCL 4 MG/2ML IJ SOLN
4.0000 mg | Freq: Once | INTRAMUSCULAR | Status: AC
  Administered 2017-12-18: 4 mg via INTRAVENOUS
  Filled 2017-12-18: qty 2

## 2017-12-18 MED ORDER — LEVOTHYROXINE SODIUM 50 MCG PO TABS
50.0000 ug | ORAL_TABLET | Freq: Every day | ORAL | Status: DC
  Administered 2017-12-18 – 2017-12-22 (×5): 50 ug via ORAL
  Filled 2017-12-18 (×5): qty 1

## 2017-12-18 MED ORDER — DILTIAZEM HCL ER COATED BEADS 240 MG PO CP24
240.0000 mg | ORAL_CAPSULE | Freq: Every day | ORAL | Status: DC
  Administered 2017-12-18 – 2017-12-19 (×2): 240 mg via ORAL
  Filled 2017-12-18 (×3): qty 1

## 2017-12-18 MED ORDER — ASPIRIN 81 MG PO CHEW
81.0000 mg | CHEWABLE_TABLET | Freq: Every day | ORAL | Status: DC
  Administered 2017-12-18 – 2017-12-22 (×4): 81 mg via ORAL
  Filled 2017-12-18 (×5): qty 1

## 2017-12-18 MED ORDER — ONDANSETRON HCL 4 MG/2ML IJ SOLN
INTRAMUSCULAR | Status: AC
  Administered 2017-12-18: 08:00:00 4 mg via INTRAVENOUS
  Filled 2017-12-18: qty 2

## 2017-12-18 MED ORDER — IOPAMIDOL (ISOVUE-300) INJECTION 61%
15.0000 mL | Freq: Once | INTRAVENOUS | Status: AC | PRN
  Administered 2017-12-18: 15 mL via ORAL

## 2017-12-18 MED ORDER — SODIUM CHLORIDE 0.9 % IV BOLUS (SEPSIS)
1000.0000 mL | Freq: Once | INTRAVENOUS | Status: AC
  Administered 2017-12-18: 1000 mL via INTRAVENOUS

## 2017-12-18 MED ORDER — FENTANYL CITRATE (PF) 100 MCG/2ML IJ SOLN
50.0000 ug | Freq: Once | INTRAMUSCULAR | Status: AC
  Administered 2017-12-18: 50 ug via INTRAVENOUS
  Filled 2017-12-18: qty 2

## 2017-12-18 MED ORDER — PROMETHAZINE HCL 25 MG/ML IJ SOLN
12.5000 mg | Freq: Three times a day (TID) | INTRAMUSCULAR | Status: DC | PRN
  Administered 2017-12-18 – 2017-12-23 (×3): 12.5 mg via INTRAVENOUS
  Filled 2017-12-18 (×3): qty 1

## 2017-12-18 MED ORDER — HEPARIN SODIUM (PORCINE) 5000 UNIT/ML IJ SOLN
5000.0000 [IU] | Freq: Three times a day (TID) | INTRAMUSCULAR | Status: DC
  Administered 2017-12-18 – 2017-12-21 (×11): 5000 [IU] via SUBCUTANEOUS
  Filled 2017-12-18 (×12): qty 1

## 2017-12-18 MED ORDER — MONTELUKAST SODIUM 5 MG PO CHEW
5.0000 mg | CHEWABLE_TABLET | Freq: Every day | ORAL | Status: DC
  Administered 2017-12-18 – 2017-12-19 (×2): 5 mg via ORAL
  Filled 2017-12-18 (×2): qty 1

## 2017-12-18 MED ORDER — ONDANSETRON HCL 4 MG/2ML IJ SOLN
4.0000 mg | Freq: Four times a day (QID) | INTRAMUSCULAR | Status: DC | PRN
  Administered 2017-12-18 – 2017-12-25 (×8): 4 mg via INTRAVENOUS
  Filled 2017-12-18 (×8): qty 2

## 2017-12-18 MED ORDER — SODIUM CHLORIDE 0.9 % IV SOLN
INTRAVENOUS | Status: DC
  Administered 2017-12-18 – 2017-12-20 (×2): via INTRAVENOUS
  Administered 2017-12-20: 1000 mL via INTRAVENOUS
  Administered 2017-12-21 (×2): via INTRAVENOUS
  Administered 2017-12-21: 1000 mL via INTRAVENOUS
  Administered 2017-12-22 – 2017-12-23 (×3): via INTRAVENOUS
  Administered 2017-12-24: 1000 mL via INTRAVENOUS
  Administered 2017-12-26 – 2017-12-29 (×3): via INTRAVENOUS

## 2017-12-18 MED ORDER — FENTANYL CITRATE (PF) 100 MCG/2ML IJ SOLN
12.5000 ug | INTRAMUSCULAR | Status: DC | PRN
  Administered 2017-12-19 (×2): 12.5 ug via INTRAVENOUS
  Administered 2017-12-20: 01:00:00 25 ug via INTRAVENOUS
  Administered 2017-12-20: 11:00:00 12.5 ug via INTRAVENOUS
  Administered 2017-12-20 (×2): 25 ug via INTRAVENOUS
  Administered 2017-12-20 – 2017-12-22 (×3): 12.5 ug via INTRAVENOUS
  Administered 2017-12-22 – 2017-12-23 (×3): 25 ug via INTRAVENOUS
  Administered 2017-12-25 (×2): 12.5 ug via INTRAVENOUS
  Filled 2017-12-18 (×14): qty 2

## 2017-12-18 MED ORDER — IOPAMIDOL (ISOVUE-300) INJECTION 61%
INTRAVENOUS | Status: AC
  Filled 2017-12-18: qty 30

## 2017-12-18 NOTE — ED Provider Notes (Signed)
Medical screening examination/treatment/procedure(s) were conducted as a shared visit with non-physician practitioner(s) and myself.  I personally evaluated the patient during the encounter.   EKG Interpretation None      Patient is an 84 year old female with history of hyperlipidemia, hypothyroidism, hypertension, COPD who presents emergency department with abdominal pain, nausea, vomiting and diarrhea, leg cramps.  Diffusely tender throughout the abdomen.  Patient appears dry on exam.  Labs show AKI with creatinine of 2.  CT scan shows signs of dysmotility.  No bowel obstruction, colitis, diverticulitis.  Suspect viral gastroenteritis but will admit for IV hydration.   Ward, Layla MawKristen N, DO 12/18/17 [phone]

## 2017-12-18 NOTE — ED Triage Notes (Addendum)
Pt reports that over the last few months she has had multiple episodes where leg cramps wake her up in the middle of the night. These cramps cause her to have SOB and sometimes diarrhea and abdominal pain during the episode. She reports that she had an episode this evening. She also endorses multiple episodes of emesis and generalized belly pain tonight. A&Ox4, Ambulatory.

## 2017-12-18 NOTE — ED Notes (Signed)
ED TO INPATIENT HANDOFF REPORT  Name/Age/Gender Katie [name] 84 y.o. female  Code Status Code Status History    This patient does not have a recorded code status. Please follow your organizational policy for patients in this situation.      Home/SNF/Other Home  Chief Complaint Abdominal pain  Level of Care/Admitting Diagnosis ED Disposition    ED Disposition Condition Comment   Admit  Hospital Area: [facility] [[id number]]  Level of Care: Med-Surg [16]  Diagnosis: UTI (urinary tract infection) [390300]  Admitting Physician: Etta Quill [phone]  Attending Physician: Etta Quill [[id number]  PT Class (Do Not Modify): Observation [104]  PT Acc Code (Do Not Modify): Observation [10022]       Medical History History reviewed. No pertinent past medical history.  Allergies Allergies  Allergen Reactions  . Other Other (See Comments)    Red Dye "Bright Color Medicines" Constipation swelling    IV Location/Drains/Wounds Patient Lines/Drains/Airways Status   Active Line/Drains/Airways    Name:   Placement date:   Placement time:   Site:   Days:   Peripheral IV 12/18/17 Left Antecubital   12/18/17    0249    Antecubital   less than 1          Labs/Imaging Results for orders placed or performed during the hospital encounter of 12/18/17 (from the past 48 hour(s))  Lipase, blood     Status: None   Collection Time: 12/18/17  1:34 AM  Result Value Ref Range   Lipase 47 11 - 51 U/L    Comment: Performed at [facility], [place] [address] [place], [place] [address]  Comprehensive metabolic panel     Status: Abnormal   Collection Time: 12/18/17  1:34 AM  Result Value Ref Range   Sodium 135 135 - 145 mmol/L   Potassium 4.2 3.5 - 5.1 mmol/L   Chloride 99 (L) 101 - 111 mmol/L   CO2 25 22 - 32 mmol/L   Glucose, Bld 144 (H) 65 - 99 mg/dL   BUN 31 (H) 6 - 20 mg/dL   Creatinine, Ser 2.10 (H) 0.44 - 1.00 mg/dL   Calcium 10.6 (H) 8.9 -  10.3 mg/dL   Total Protein 8.6 (H) 6.5 - 8.1 g/dL   Albumin 4.5 3.5 - 5.0 g/dL   AST 30 15 - 41 U/L   ALT 20 14 - 54 U/L   Alkaline Phosphatase 53 38 - 126 U/L   Total Bilirubin 0.5 0.3 - 1.2 mg/dL   GFR calc non Af Amer 21 (L) >60 mL/min   GFR calc Af Amer 24 (L) >60 mL/min    Comment: (NOTE) The eGFR has been calculated using the CKD EPI equation. This calculation has not been validated in all clinical situations. eGFR's persistently <60 mL/min signify possible Chronic Kidney Disease.    Anion gap 11 5 - 15    Comment: Performed at [facility], [place] [address] [place], [place] [address]  CBC     Status: Abnormal   Collection Time: 12/18/17  1:34 AM  Result Value Ref Range   WBC 14.0 (H) 4.0 - 10.5 K/uL   RBC 4.86 3.87 - 5.11 MIL/uL   Hemoglobin 15.1 (H) 12.0 - 15.0 g/dL   HCT 43.5 36.0 - 46.0 %   MCV 89.5 78.0 - 100.0 fL   MCH 31.1 26.0 - 34.0 pg   MCHC 34.7 30.0 - 36.0 g/dL   RDW 13.2 11.5 - 15.5 %  Platelets 271 150 - 400 K/uL    Comment: Performed at [facility], [place] [address], [place], [place] [address]  Urinalysis, Routine w reflex microscopic     Status: Abnormal   Collection Time: 12/18/17  1:34 AM  Result Value Ref Range   Color, Urine YELLOW YELLOW   APPearance HAZY (A) CLEAR   Specific Gravity, Urine 1.023 1.005 - 1.030   pH 5.0 5.0 - 8.0   Glucose, UA NEGATIVE NEGATIVE mg/dL   Hgb urine dipstick NEGATIVE NEGATIVE   Bilirubin Urine NEGATIVE NEGATIVE   Ketones, ur 5 (A) NEGATIVE mg/dL   Protein, ur NEGATIVE NEGATIVE mg/dL   Nitrite NEGATIVE NEGATIVE   Leukocytes, UA SMALL (A) NEGATIVE   RBC / HPF 0-5 0 - 5 RBC/hpf   WBC, UA 6-30 0 - 5 WBC/hpf   Bacteria, UA RARE (A) NONE SEEN   Squamous Epithelial / LPF 6-30 (A) NONE SEEN   Mucus PRESENT    Hyaline Casts, UA PRESENT     Comment: Performed at [facility], [place] [address], [place], [place] [address]   Ct Abdomen Pelvis Wo Contrast  Result Date:  12/18/2017 CLINICAL DATA:  Acute onset of vomiting and generalized abdominal pain. EXAM: CT ABDOMEN AND PELVIS WITHOUT CONTRAST TECHNIQUE: Multidetector CT imaging of the abdomen and pelvis was performed following the standard protocol without IV contrast. COMPARISON:  None. FINDINGS: Lower chest: Minimal left basilar atelectasis or scarring is noted. Diffuse coronary artery calcifications are seen. The patient is status post median sternotomy. Hepatobiliary: Scattered nonspecific hypodensities within the liver measure up to 2.5 cm in size. The liver is otherwise unremarkable. The gallbladder is grossly unremarkable. The common bile duct remains normal in size. Pancreas: The pancreas is within normal limits. Spleen: Calcification at the spleen likely reflects remote traumatic injury. Adrenals/Urinary Tract: The adrenal glands are unremarkable in appearance. Vascular calcifications are seen at the renal hila bilaterally, and along both renal arteries. Nonspecific perinephric stranding is noted bilaterally. There is no evidence of hydronephrosis. A left renal cyst is noted. No obstructing ureteral stones are seen. Stomach/Bowel: There is mild distention and gradual decompression of small bowel loops, which may reflect mild dysmotility. The ileocolic anastomosis at the left mid abdomen is unremarkable in appearance. The remaining colon is largely decompressed and grossly unremarkable. The stomach is partially filled with fluid and is unremarkable in appearance. Vascular/Lymphatic: Diffuse calcification is seen along the abdominal aorta and its branches, with severe luminal narrowing noted at the infrarenal abdominal aorta. No retroperitoneal or pelvic sidewall lymphadenopathy is seen. Reproductive: The bladder is mildly distended and grossly unremarkable. The uterus is unremarkable. The ovaries are relatively symmetric. No suspicious adnexal masses are seen. Other: No additional soft tissue abnormalities are seen.  Musculoskeletal: No acute osseous abnormalities are identified. Multilevel vacuum phenomenon is noted along the lumbar spine, with endplate sclerotic change and facet disease. The visualized musculature is unremarkable in appearance. IMPRESSION: 1. Mild distention and gradual decompression of small bowel loops, which may reflect mild small bowel dysmotility. No evidence of bowel obstruction. Ileocolic anastomosis is unremarkable in appearance 2. Diffuse aortic atherosclerosis, with severe luminal narrowing at the infrarenal abdominal aorta. 3. Diffuse coronary artery calcifications. 4. Scattered nonspecific hypodensities within the liver measure up to 2.5 cm in size. 5. Small left renal cyst. 6. Degenerative change along the lumbar spine. Aortic Atherosclerosis (ICD10-I70.0). Electronically Signed   By: Garald Balding M.D.   On: 12/18/2017 04:13    Pending Labs [employer] (  From admission, onward)   None      Vitals/Pain Today's Vitals   12/18/17 0404 12/18/17 0405 12/18/17 0545 12/18/17 0550  BP:  (!) 190/70 (!) 189/65   Pulse:  82 90   Resp:  18 20   Temp:      TempSrc:      SpO2:  99% 93%   PainSc: 2    7     Isolation Precautions No active isolations  Medications Medications  iopamidol (ISOVUE-300) 61 % injection (not administered)  0.9 %  sodium chloride infusion (not administered)  fentaNYL (SUBLIMAZE) injection 50 mcg (50 mcg Intravenous Given 12/18/17 0249)  ondansetron (ZOFRAN) injection 4 mg (4 mg Intravenous Given 12/18/17 0250)  sodium chloride 0.9 % bolus 1,000 mL (0 mLs Intravenous Stopped 12/18/17 0548)  iopamidol (ISOVUE-300) 61 % injection 15 mL (15 mLs Oral Contrast Given 12/18/17 0304)  fentaNYL (SUBLIMAZE) injection 50 mcg (50 mcg Intravenous Given 12/18/17 0416)    Mobility walks with person assist

## 2017-12-18 NOTE — ED Notes (Signed)
Attempted lab draw x 2 but unsuccessful. 

## 2017-12-18 NOTE — H&P (Signed)
History and Physical   Katie MorinMary Agnes [name] ZOX:[id number] DOB: 04-11-1933 DOA: 12/18/2017  Referring MD/NP/PA: Sharilyn SitesLisa Sanders, PA, EDP PCP: Macy MisBriscoe, Kim K, MD Outpatient Specialists: Cardiology, Renaldo. GI, Jue.  Patient coming from: Home  Chief Complaint: Leg cramps and nausea/vomiting  HPI: Katie MorinMary Agnes [name] is an 84 y.o. female with a history of CAD s/p CABG x3 2008, partial colectomy for invasive polyp and take back for postop infection remotely, HTN, hypothyroidism, HLD, and COPD who presented to the ED after she awoke with leg cramping and noted significant nausea, vomiting and generalized abdominal pain since last evening. This has happened sporadically for a few weeks, but was more severe than previously and associated with loose stools and abdominal bloating. She was hypertensive and afebrile in the ED with continued vomiting, AKI, leukocytosis and ketonuria. CT showed evidence of small bowel dysmotility, but no obstruction. It also noted diffuse aortic atherosclerosis and severe infrarenal luminal narrowing. Admission was requested for dehydration, AKI due to intractable nausea and vomiting due to presumed viral gastroenteritis.  Review of Systems: She denies any suspicious ingestion, fever, sick contacts, hematemesis, hematochezia, melena. She had been eating less that day as well. She has had the leg cramping that awakens her at night for months without any directed treatment, denying claudication, and per HPI. All others reviewed and are negative.   PMH: CAD s/p CABG x3 2008, partial colectomy for invasive polyp and take back for postop infection remotely, HTN, hypothyroidism, HLD, and COPD PSH: [place]lect[place] per [place] in [place]. SH: Lives with husband, no EtOH or smoking.   Allergies  Allergen Reactions  . Other Other (See Comments)    Red Dye "Bright Color Medicines" Constipation swelling   - Family history reviewed and not pertinent. Medication Sig  aspirin 81 MG  chewable tablet Chew 81 mg by mouth daily.  Cholecalciferol (VITAMIN D-3) 1000 units CAPS Take 1,000 Units by mouth daily.  diltiazem (CARDIZEM CD) 240 MG 24 hr capsule Take 240 mg by mouth daily.  irbesartan-hydrochlorothiazide (AVALIDE) 150-12.5 MG tablet Take 2 tablets by mouth daily.  levothyroxine (SYNTHROID, LEVOTHROID) 50 MCG tablet Take 50 mcg by mouth daily.  montelukast (SINGULAIR) 5 MG chewable tablet Chew 5 mg by mouth at bedtime.  Multiple Vitamins-Minerals (ALIVE WOMENS 50+) TABS Take 1 tablet by mouth daily.  pravastatin (PRAVACHOL) 40 MG tablet Take 40 mg by mouth daily.  spironolactone (ALDACTONE) 25 MG tablet Take 25 mg by mouth daily.  vitamin B-12 (CYANOCOBALAMIN) 100 MCG tablet Take 1,000 mcg by mouth daily.    Physical Exam: Vitals:   12/18/17 0300 12/18/17 0405 12/18/17 0545 12/18/17 0651  BP: (!) 177/62 (!) 190/70 (!) 189/65 (!) 177/62  Pulse: 75 82 90 98  Resp: 18 18 20 18   Temp:    98.2 F (36.8 C)  TempSrc:    Oral  SpO2: 95% 99% 93% 98%  Weight:    63.1 kg (139 lb 1.8 oz)  Height:    4\' 9"  (1.448 m)   Constitutional: Edlerly female in no distress, calm demeanor Eyes: Lids and conjunctivae normal, PERRL ENMT: Mucous membranes are tacky. Posterior pharynx clear of any exudate or lesions. Poor dentition.  Neck: normal, supple, no masses, no thyromegaly Respiratory: Non-labored breathing room air without accessory muscle use. Clear breath sounds to auscultation bilaterally Cardiovascular: Regular rate and rhythm, no murmurs, rubs, or gallops. No carotid bruits. No JVD. No LE edema. Not able to palpate pedal pulses in cold feet without ulceration. Abdomen: Normoactive bowel sounds. Diffuse tenderness  without rebound. +guarding, distended but not tympanitic. No hepatosplenomegaly. GU: No indwelling catheter Musculoskeletal: No clubbing / cyanosis. No joint deformity upper and lower extremities. Good ROM, no contractures. Normal muscle tone.  Skin: Warm, dry. No  rashes, wounds, no ulcers. No significant lesions noted.  Neurologic: CN II-XII grossly intact. Gait not assessed. Speech normal. No focal deficits in motor strength or sensation in all extremities.  Psychiatric: Alert and oriented x3. Normal judgment and insight. Mood euthymic with depressed affect.   Labs on Admission: I have personally reviewed following labs and imaging studies  CBC: Recent Labs  Lab 12/18/17 0134  WBC 14.0*  HGB 15.1*  HCT 43.5  MCV 89.5  PLT 271   Basic Metabolic Panel: Recent Labs  Lab 12/18/17 0134  NA 135  K 4.2  CL 99*  CO2 25  GLUCOSE 144*  BUN 31*  CREATININE 2.10*  CALCIUM 10.6*   GFR: Estimated Creatinine Clearance: 15.8 mL/min (A) (by C-G formula based on SCr of 2.1 mg/dL (H)). Liver Function Tests: Recent Labs  Lab 12/18/17 0134  AST 30  ALT 20  ALKPHOS 53  BILITOT 0.5  PROT 8.6*  ALBUMIN 4.5   Recent Labs  Lab 12/18/17 0134  LIPASE 47   Urine analysis:    Component Value Date/Time   COLORURINE YELLOW 12/18/2017 0134   APPEARANCEUR HAZY (A) 12/18/2017 0134   LABSPEC 1.023 12/18/2017 0134   PHURINE 5.0 12/18/2017 0134   GLUCOSEU NEGATIVE 12/18/2017 0134   HGBUR NEGATIVE 12/18/2017 0134   BILIRUBINUR NEGATIVE 12/18/2017 0134   KETONESUR 5 (A) 12/18/2017 0134   PROTEINUR NEGATIVE 12/18/2017 0134   NITRITE NEGATIVE 12/18/2017 0134   LEUKOCYTESUR SMALL (A) 12/18/2017 0134   Radiological Exams on Admission: Ct Abdomen Pelvis Wo Contrast  Result Date: 12/18/2017 CLINICAL DATA:  Acute onset of vomiting and generalized abdominal pain. EXAM: CT ABDOMEN AND PELVIS WITHOUT CONTRAST TECHNIQUE: Multidetector CT imaging of the abdomen and pelvis was performed following the standard protocol without IV contrast. COMPARISON:  None. FINDINGS: Lower chest: Minimal left basilar atelectasis or scarring is noted. Diffuse coronary artery calcifications are seen. The patient is status post median sternotomy. Hepatobiliary: Scattered  nonspecific hypodensities within the liver measure up to 2.5 cm in size. The liver is otherwise unremarkable. The gallbladder is grossly unremarkable. The common bile duct remains normal in size. Pancreas: The pancreas is within normal limits. Spleen: Calcification at the spleen likely reflects remote traumatic injury. Adrenals/Urinary Tract: The adrenal glands are unremarkable in appearance. Vascular calcifications are seen at the renal hila bilaterally, and along both renal arteries. Nonspecific perinephric stranding is noted bilaterally. There is no evidence of hydronephrosis. A left renal cyst is noted. No obstructing ureteral stones are seen. Stomach/Bowel: There is mild distention and gradual decompression of small bowel loops, which may reflect mild dysmotility. The ileocolic anastomosis at the left mid abdomen is unremarkable in appearance. The remaining colon is largely decompressed and grossly unremarkable. The stomach is partially filled with fluid and is unremarkable in appearance. Vascular/Lymphatic: Diffuse calcification is seen along the abdominal aorta and its branches, with severe luminal narrowing noted at the infrarenal abdominal aorta. No retroperitoneal or pelvic sidewall lymphadenopathy is seen. Reproductive: The bladder is mildly distended and grossly unremarkable. The uterus is unremarkable. The ovaries are relatively symmetric. No suspicious adnexal masses are seen. Other: No additional soft tissue abnormalities are seen. Musculoskeletal: No acute osseous abnormalities are identified. Multilevel vacuum phenomenon is noted along the lumbar spine, with endplate sclerotic change and facet  disease. The visualized musculature is unremarkable in appearance. IMPRESSION: 1. Mild distention and gradual decompression of small bowel loops, which may reflect mild small bowel dysmotility. No evidence of bowel obstruction. Ileocolic anastomosis is unremarkable in appearance 2. Diffuse aortic  atherosclerosis, with severe luminal narrowing at the infrarenal abdominal aorta. 3. Diffuse coronary artery calcifications. 4. Scattered nonspecific hypodensities within the liver measure up to 2.5 cm in size. 5. Small left renal cyst. 6. Degenerative change along the lumbar spine. Aortic Atherosclerosis (ICD10-I70.0). Electronically Signed   By: Roanna Raider M.D.   On: 12/18/2017 04:13    Assessment/Plan Principal Problem:   Viral gastroenteritis Active Problems:   AKI (acute kidney injury) (HCC)   Aortic atherosclerosis (HCC)   CAD (coronary artery disease)   HTN (hypertension)   Hypothyroidism  Intractable nausea, vomiting, diarrhea, abdominal pain: CT with suggestion of dysmotility, no obstruction. Will treat empirically for gastroenteritis. Note pt was seen by GI for postprandial giarrhea in Jan, started on fiber supplement, no further work up planned. - IVF's, NPO, will attempt meds by mouth this morning. Can advance diet to clears when patient is ready/symptoms better controlled. - IV antiemetics, analgesics - Exam is stable from report, no indication for further work up at this time.    AKI: SCr 2.10 due to dehydration.  - IVF and recheck BMP - Monitor UOP.   Aortic atherosclerosis with infrarenal luminal narrowing: With h/o CAD, suspect PAD with poor LE perfusion, though denies claudication symptoms. - ABI/TBI  CAD: s/p CABG x3 in 2008, followed by cardiology at [facility]. No chest pain, dyspnea.  - Continue home medications ASA, statin  HTN:  - Hold ARB, thiazide, spironolactone with AKI and dehydration.  - Continue cardizem  Asthma:  - Continue singulair, no inhalers at home, no wheezing currently.  Hypothyroidism: Chronic, stable - Continue synthroid  Pyuria: On dirty-catch specimen without suprapubic tenderness, dysuria or other urinary complaints. No intervention planned.  DVT prophylaxis: Heparin  Code Status: Full  Family Communication: Husband at  bedside Disposition Plan: Uncertain Consults called: None  Admission status: Observation    Hazeline Junker, MD [facility] [email or web address] Password [email or web address] 12/18/2017, 10:05 AM

## 2017-12-18 NOTE — ED Notes (Signed)
Pt. Drank half bottle of water now vomiting.

## 2017-12-18 NOTE — ED Provider Notes (Signed)
[facility]-EMERGENCY DEPT Provider Note   CSN: [id number] Arrival date & time: 12/18/17  0002     History   Chief Complaint Chief Complaint  Patient presents with  . Abdominal Pain    HPI Katie [name] is a 84 y.o. female.  The history is provided by the patient and medical records.  Abdominal Pain   Associated symptoms include diarrhea, nausea and vomiting.     84 y.o. F with PMH of HTN, HLP, hypothyroidism, COPD, presenting to the ED for abdominal pain.  States over the past few weeks she has been having a lot of "stomach trouble".  States she wakes up a lot in the middle of the night with "charlie horses" in her legs.  States these are intense and seem to "take her breath away" because of pain.  States often times the next day she will have nausea and diarrhea.  States this has been the case over the past week but last night pain got worse in her stomach and she began vomiting.  She continues to have loose, watery diarrhea.  States currently she feels a little bloated.  She has been able to pass some gas.  She has had prior abdominal surgeries but cannot recall exactly what it was.  States they were done in [place] several years ago.  She had both of them within the same week due to infection.  History reviewed. No pertinent past medical history.  There are no active problems to display for this patient.   History reviewed. No pertinent surgical history.  OB History    No data available       Home Medications    Prior to Admission medications   Not on File    Family History History reviewed. No pertinent family history.  Social History Social History   Tobacco Use  . Smoking status: Not on file  Substance Use Topics  . Alcohol use: Not on file  . Drug use: Not on file     Allergies   Patient has no known allergies.   Review of Systems Review of Systems  Gastrointestinal: Positive for abdominal pain, diarrhea, nausea and vomiting.    All other systems reviewed and are negative.    Physical Exam Updated Vital Signs BP (!) 185/70   Pulse 81   Temp 97.7 F (36.5 C) (Oral)   Resp 18   SpO2 99%   Physical Exam  Constitutional: She is oriented to person, place, and time. She appears well-developed and well-nourished.  HENT:  Head: Normocephalic and atraumatic.  Mouth/Throat: Oropharynx is clear and moist.  Eyes: Conjunctivae and EOM are normal. Pupils are equal, round, and reactive to light.  Neck: Normal range of motion.  Cardiovascular: Normal rate, regular rhythm and normal heart sounds.  Pulmonary/Chest: Effort normal and breath sounds normal. She has no wheezes. She has no rhonchi. She has no rales.  Abdominal: Soft. Bowel sounds are normal. She exhibits distension. There is generalized tenderness.  Musculoskeletal: Normal range of motion.  Legs do not appears swollen, no palpable cords; no overlying erythema or other skin changes  Neurological: She is alert and oriented to person, place, and time.  Skin: Skin is warm and dry.  Psychiatric: She has a normal mood and affect.  Nursing note and vitals reviewed.    ED Treatments / Results  Labs (all labs ordered are listed, but only abnormal results are displayed) Labs Reviewed  COMPREHENSIVE METABOLIC PANEL - Abnormal; Notable for the following  components:      Result Value   Chloride 99 (*)    Glucose, Bld 144 (*)    BUN 31 (*)    Creatinine, Ser 2.10 (*)    Calcium 10.6 (*)    Total Protein 8.6 (*)    GFR calc non Af Amer 21 (*)    GFR calc Af Amer 24 (*)    All other components within normal limits  CBC - Abnormal; Notable for the following components:   WBC 14.0 (*)    Hemoglobin 15.1 (*)    All other components within normal limits  URINALYSIS, ROUTINE W REFLEX MICROSCOPIC - Abnormal; Notable for the following components:   APPearance HAZY (*)    Ketones, ur 5 (*)    Leukocytes, UA SMALL (*)    Bacteria, UA RARE (*)    Squamous  Epithelial / LPF 6-30 (*)    All other components within normal limits  LIPASE, BLOOD    EKG  EKG Interpretation None       Radiology Ct Abdomen Pelvis Wo Contrast  Result Date: 12/18/2017 CLINICAL DATA:  Acute onset of vomiting and generalized abdominal pain. EXAM: CT ABDOMEN AND PELVIS WITHOUT CONTRAST TECHNIQUE: Multidetector CT imaging of the abdomen and pelvis was performed following the standard protocol without IV contrast. COMPARISON:  None. FINDINGS: Lower chest: Minimal left basilar atelectasis or scarring is noted. Diffuse coronary artery calcifications are seen. The patient is status post median sternotomy. Hepatobiliary: Scattered nonspecific hypodensities within the liver measure up to 2.5 cm in size. The liver is otherwise unremarkable. The gallbladder is grossly unremarkable. The common bile duct remains normal in size. Pancreas: The pancreas is within normal limits. Spleen: Calcification at the spleen likely reflects remote traumatic injury. Adrenals/Urinary Tract: The adrenal glands are unremarkable in appearance. Vascular calcifications are seen at the renal hila bilaterally, and along both renal arteries. Nonspecific perinephric stranding is noted bilaterally. There is no evidence of hydronephrosis. A left renal cyst is noted. No obstructing ureteral stones are seen. Stomach/Bowel: There is mild distention and gradual decompression of small bowel loops, which may reflect mild dysmotility. The ileocolic anastomosis at the left mid abdomen is unremarkable in appearance. The remaining colon is largely decompressed and grossly unremarkable. The stomach is partially filled with fluid and is unremarkable in appearance. Vascular/Lymphatic: Diffuse calcification is seen along the abdominal aorta and its branches, with severe luminal narrowing noted at the infrarenal abdominal aorta. No retroperitoneal or pelvic sidewall lymphadenopathy is seen. Reproductive: The bladder is mildly distended  and grossly unremarkable. The uterus is unremarkable. The ovaries are relatively symmetric. No suspicious adnexal masses are seen. Other: No additional soft tissue abnormalities are seen. Musculoskeletal: No acute osseous abnormalities are identified. Multilevel vacuum phenomenon is noted along the lumbar spine, with endplate sclerotic change and facet disease. The visualized musculature is unremarkable in appearance. IMPRESSION: 1. Mild distention and gradual decompression of small bowel loops, which may reflect mild small bowel dysmotility. No evidence of bowel obstruction. Ileocolic anastomosis is unremarkable in appearance 2. Diffuse aortic atherosclerosis, with severe luminal narrowing at the infrarenal abdominal aorta. 3. Diffuse coronary artery calcifications. 4. Scattered nonspecific hypodensities within the liver measure up to 2.5 cm in size. 5. Small left renal cyst. 6. Degenerative change along the lumbar spine. Aortic Atherosclerosis (ICD10-I70.0). Electronically Signed   By: Roanna RaiderJeffery  Chang M.D.   On: 12/18/2017 04:13    Procedures Procedures (including critical care time)  Medications Ordered in ED Medications  iopamidol (ISOVUE-300) 61 %  injection (not administered)  fentaNYL (SUBLIMAZE) injection 50 mcg (50 mcg Intravenous Given 12/18/17 0249)  ondansetron (ZOFRAN) injection 4 mg (4 mg Intravenous Given 12/18/17 0250)  sodium chloride 0.9 % bolus 1,000 mL (1,000 mLs Intravenous New Bag/Given 12/18/17 0401)  iopamidol (ISOVUE-300) 61 % injection 15 mL (15 mLs Oral Contrast Given 12/18/17 0304)  fentaNYL (SUBLIMAZE) injection 50 mcg (50 mcg Intravenous Given 12/18/17 0416)     Initial Impression / Assessment and Plan / ED Course  I have reviewed the triage vital signs and the nursing notes.  Pertinent labs & imaging results that were available during my care of the patient were reviewed by me and considered in my medical decision making (see chart for details).  84 year old female here  with generalized abdominal pain, nausea, vomiting, diarrhea.  She is afebrile and nontoxic.  Abdomen with generalized tenderness and some mild distention.  She has been able to pass gas.  Screening labs obtained, evidence of AKI and mild leukocytosis.  She was given IV fluids.  CT without contrast was obtained given her renal function, mild distention and gradual decompression of bowel loops, question of small bowel dysmotility.  Likely viral gastroenteritis.  No obstructive process identified.  Patient will be admitted for continued IV hydration.  Discussed with hospitalist team, they will admit for ongoing care.  Final Clinical Impressions(s) / ED Diagnoses   Final diagnoses:  Generalized abdominal pain  AKI (acute kidney injury) [facility])    ED Discharge Orders    None       Garlon Hatchet, PA-C 12/18/17 0510

## 2017-12-19 ENCOUNTER — Observation Stay (HOSPITAL_COMMUNITY): Payer: Medicare HMO

## 2017-12-19 DIAGNOSIS — I2510 Atherosclerotic heart disease of native coronary artery without angina pectoris: Secondary | ICD-10-CM | POA: Diagnosis present

## 2017-12-19 DIAGNOSIS — E785 Hyperlipidemia, unspecified: Secondary | ICD-10-CM | POA: Diagnosis present

## 2017-12-19 DIAGNOSIS — R112 Nausea with vomiting, unspecified: Secondary | ICD-10-CM

## 2017-12-19 DIAGNOSIS — R634 Abnormal weight loss: Secondary | ICD-10-CM | POA: Diagnosis present

## 2017-12-19 DIAGNOSIS — R1084 Generalized abdominal pain: Secondary | ICD-10-CM | POA: Diagnosis not present

## 2017-12-19 DIAGNOSIS — E039 Hypothyroidism, unspecified: Secondary | ICD-10-CM | POA: Diagnosis not present

## 2017-12-19 DIAGNOSIS — I5032 Chronic diastolic (congestive) heart failure: Secondary | ICD-10-CM | POA: Diagnosis present

## 2017-12-19 DIAGNOSIS — Z79890 Hormone replacement therapy (postmenopausal): Secondary | ICD-10-CM | POA: Diagnosis not present

## 2017-12-19 DIAGNOSIS — I739 Peripheral vascular disease, unspecified: Secondary | ICD-10-CM | POA: Diagnosis present

## 2017-12-19 DIAGNOSIS — I361 Nonrheumatic tricuspid (valve) insufficiency: Secondary | ICD-10-CM | POA: Diagnosis not present

## 2017-12-19 DIAGNOSIS — Z9049 Acquired absence of other specified parts of digestive tract: Secondary | ICD-10-CM | POA: Diagnosis not present

## 2017-12-19 DIAGNOSIS — I21A1 Myocardial infarction type 2: Secondary | ICD-10-CM | POA: Diagnosis not present

## 2017-12-19 DIAGNOSIS — E860 Dehydration: Secondary | ICD-10-CM | POA: Diagnosis present

## 2017-12-19 DIAGNOSIS — Z91013 Allergy to seafood: Secondary | ICD-10-CM | POA: Diagnosis not present

## 2017-12-19 DIAGNOSIS — N182 Chronic kidney disease, stage 2 (mild): Secondary | ICD-10-CM | POA: Diagnosis present

## 2017-12-19 DIAGNOSIS — N390 Urinary tract infection, site not specified: Secondary | ICD-10-CM | POA: Diagnosis present

## 2017-12-19 DIAGNOSIS — Z87891 Personal history of nicotine dependence: Secondary | ICD-10-CM | POA: Diagnosis not present

## 2017-12-19 DIAGNOSIS — R10817 Generalized abdominal tenderness: Secondary | ICD-10-CM | POA: Diagnosis not present

## 2017-12-19 DIAGNOSIS — M79609 Pain in unspecified limb: Secondary | ICD-10-CM | POA: Diagnosis not present

## 2017-12-19 DIAGNOSIS — I10 Essential (primary) hypertension: Secondary | ICD-10-CM | POA: Diagnosis not present

## 2017-12-19 DIAGNOSIS — R1013 Epigastric pain: Secondary | ICD-10-CM | POA: Diagnosis not present

## 2017-12-19 DIAGNOSIS — I130 Hypertensive heart and chronic kidney disease with heart failure and stage 1 through stage 4 chronic kidney disease, or unspecified chronic kidney disease: Secondary | ICD-10-CM | POA: Diagnosis present

## 2017-12-19 DIAGNOSIS — I700 Atherosclerosis of aorta: Secondary | ICD-10-CM | POA: Diagnosis present

## 2017-12-19 DIAGNOSIS — I252 Old myocardial infarction: Secondary | ICD-10-CM | POA: Diagnosis not present

## 2017-12-19 DIAGNOSIS — N179 Acute kidney failure, unspecified: Secondary | ICD-10-CM | POA: Diagnosis present

## 2017-12-19 DIAGNOSIS — R824 Acetonuria: Secondary | ICD-10-CM | POA: Diagnosis present

## 2017-12-19 DIAGNOSIS — Z91048 Other nonmedicinal substance allergy status: Secondary | ICD-10-CM | POA: Diagnosis not present

## 2017-12-19 DIAGNOSIS — R079 Chest pain, unspecified: Secondary | ICD-10-CM | POA: Diagnosis not present

## 2017-12-19 DIAGNOSIS — Z7982 Long term (current) use of aspirin: Secondary | ICD-10-CM | POA: Diagnosis not present

## 2017-12-19 DIAGNOSIS — Z86010 Personal history of colonic polyps: Secondary | ICD-10-CM | POA: Diagnosis not present

## 2017-12-19 DIAGNOSIS — J449 Chronic obstructive pulmonary disease, unspecified: Secondary | ICD-10-CM | POA: Diagnosis present

## 2017-12-19 DIAGNOSIS — A084 Viral intestinal infection, unspecified: Secondary | ICD-10-CM | POA: Diagnosis present

## 2017-12-19 DIAGNOSIS — Z951 Presence of aortocoronary bypass graft: Secondary | ICD-10-CM | POA: Diagnosis not present

## 2017-12-19 DIAGNOSIS — I340 Nonrheumatic mitral (valve) insufficiency: Secondary | ICD-10-CM | POA: Diagnosis not present

## 2017-12-19 LAB — BASIC METABOLIC PANEL
ANION GAP: 10 (ref 5–15)
BUN: 18 mg/dL (ref 6–20)
CALCIUM: 9.3 mg/dL (ref 8.9–10.3)
CO2: 21 mmol/L — ABNORMAL LOW (ref 22–32)
CREATININE: 1.45 mg/dL — AB (ref 0.44–1.00)
Chloride: 102 mmol/L (ref 101–111)
GFR, EST AFRICAN AMERICAN: 38 mL/min — AB (ref >60)
GFR, EST NON AFRICAN AMERICAN: 33 mL/min — AB (ref >60)
Glucose, Bld: 97 mg/dL (ref 65–99)
Potassium: 4.2 mmol/L (ref 3.5–5.1)
SODIUM: 133 mmol/L — AB (ref 135–145)

## 2017-12-19 LAB — CBC
HCT: 39.9 % (ref 36.0–46.0)
HEMOGLOBIN: 13.5 g/dL (ref 12.0–15.0)
MCH: 30.7 pg (ref 26.0–34.0)
MCHC: 33.8 g/dL (ref 30.0–36.0)
MCV: 90.7 fL (ref 78.0–100.0)
PLATELETS: 267 10*3/uL (ref 150–400)
RBC: 4.4 MIL/uL (ref 3.87–5.11)
RDW: 13.5 % (ref 11.5–15.5)
WBC: 11.4 10*3/uL — ABNORMAL HIGH (ref 4.0–10.5)

## 2017-12-19 NOTE — Progress Notes (Signed)
ABIs and TBIs bilateral completed. Rt ABI indicate a moderate reduction in arterial flow 0.56. Lt Vanice Sarahbi indicates a severe reduction in arterial flow 0.39. Bilateral great toe pressures are abnormal. Virgiinia Ernestina Joe, RVS 12/19/2017 4:35 PM

## 2017-12-19 NOTE — Evaluation (Signed)
Physical Therapy Evaluation Patient Details Name: Katie [name] MRN: [id number] DOB: 03/06/1933 Today's Date: 12/19/2017   History of Present Illness  Katie [name] is an 84 y.o. female with a history of CAD s/p CABG x3 2008, partial colectomy for invasive polyp and take back for postop infection remotely, HTN, hypothyroidism, HLD, and COPD who presented to the ED on 12/18/17 after she awoke with leg cramping and noted significant nausea, vomiting and generalized abdominal pain, treating for viral gastroenteritis  Clinical Impression  Patient evaluated by Physical Therapy with no further acute PT needs identified. All education has been completed and the patient has no further questions. See below for any follow-up Physical Therapy or equipment needs. PT is signing off. Thank you for this referral. Pt doing well but with issues related to bowel urgency; pt without LOB, able to maneuver in room and bathroom, perform pericare in standing without LOB; encourage pt to amb with nursing staff as tol    Follow Up Recommendations No PT follow up    Equipment Recommendations  None recommended by PT    Recommendations for Other Services       Precautions / Restrictions Precautions Precautions: None      Mobility  Bed Mobility Overal bed mobility: Needs Assistance Bed Mobility: Supine to Sit     Supine to sit: Independent        Transfers Overall transfer level: Modified independent               General transfer comment: slightlly incr time, assist withIV management  Ambulation/Gait Ambulation/Gait assistance: Supervision;Modified independent (Device/Increase time) Ambulation Distance (Feet): 15 Feet Assistive device: None Gait Pattern/deviations: Step-through pattern     General Gait Details: pt amb in room and to bathroom; did not go into hallway d/t bathroom needs  Stairs            Wheelchair Mobility    Modified Rankin (Stroke Patients Only)        Balance Overall balance assessment: Independent(see below)                                           Pertinent Vitals/Pain Pain Assessment: No/denies pain    Home Living Family/patient expects to be discharged to:: Private residence Living Arrangements: Spouse/significant other   Type of Home: Apartment Home Access: Level entry     Home Layout: One level Home Equipment: None      Prior Function Level of Independence: Independent               Hand Dominance        Extremity/Trunk Assessment   Upper Extremity Assessment Upper Extremity Assessment: Overall WFL for tasks assessed    Lower Extremity Assessment Lower Extremity Assessment: Overall WFL for tasks assessed       Communication   Communication: No difficulties  Cognition Arousal/Alertness: Awake/alert Behavior During Therapy: WFL for tasks assessed/performed Overall Cognitive Status: Within Functional Limits for tasks assessed                                        General Comments General comments (skin integrity, edema, etc.): pt able to bend to mid lower leg, reach ~10' inches outside BOS, perform LB bathring and peri-care in standing position; left pt with NT in bathroom  to complete hygiene    Exercises     Assessment/Plan    PT Assessment Patent does not need any further PT services  PT Problem List         PT Treatment Interventions      PT Goals (Current goals can be found in the Care Plan section)  Acute Rehab PT Goals Patient Stated Goal: home and feel better PT Goal Formulation: All assessment and education complete, DC therapy    Frequency     Barriers to discharge        Co-evaluation               AM-PAC PT "6 Clicks" Daily Activity  Outcome Measure Difficulty turning over in bed (including adjusting bedclothes, sheets and blankets)?: None Difficulty moving from lying on back to sitting on the side of the bed? :  None Difficulty sitting down on and standing up from a chair with arms (e.g., wheelchair, bedside commode, etc,.)?: None Help needed moving to and from a bed to chair (including a wheelchair)?: None Help needed walking in hospital room?: None Help needed climbing 3-5 steps with a railing? : None 6 Click Score: 24    End of Session   Activity Tolerance: Patient tolerated treatment well Patient left: Other (comment)(in bathroom with NT)   PT Visit Diagnosis: Difficulty in walking, not elsewhere classified (R26.2)    Time: 1211-1225 PT Time Calculation (min) (ACUTE ONLY): 14 min   Charges:   PT Evaluation $PT Eval Low Complexity: 1 Low     PT G CodesDrucilla Chalet:        Sahvanna Mcmanigal, PT Pager: [phone] 12/19/2017   [facility] IncWILLIAMS,Toby Ayad 12/19/2017, 1:20 PM

## 2017-12-19 NOTE — Progress Notes (Signed)
[facility] Progress Note  Subjective: says has been sick for 3-6 months w/ GI symptoms and also severe leg cramping , unpredictable.    Vitals:   12/18/17 1259 12/18/17 2100 12/19/17 0611 12/19/17 1328  BP: (!) 171/67 (!) 182/82 (!) 156/52 (!) 133/56  Pulse: (!) 101 85 71 68  Resp: 16 15 16 15   Temp: 98.9 F (37.2 C) 98.3 F (36.8 C) 98.8 F (37.1 C) 97.7 F (36.5 C)  TempSrc: Oral Oral Oral Oral  SpO2: 98% 97% 100% 100%  Weight:      Height:        Inpatient medications: . aspirin  81 mg Oral Daily  . diltiazem  240 mg Oral Daily  . heparin injection (subcutaneous)  5,000 Units Subcutaneous Q8H  . levothyroxine  50 mcg Oral QAC breakfast  . montelukast  5 mg Oral QHS  . pravastatin  40 mg Oral Daily   . sodium chloride 75 mL/hr at 12/18/17 0700   fentaNYL (SUBLIMAZE) injection, ondansetron (ZOFRAN) IV, promethazine  Exam: Alert, no distress No  jvd Chext cta bilat RRR Abd distended, firm, diffuse mild tenderness, no rebound, +BS Ext dec'd pulses in feet, no gangrene  NF Ox 3   Brief Summary: Katie [name] is an 84 y.o. female with a history of CAD s/p CABG x3 2008, partial colectomy for invasive polyp and take back for postop infection remotely, HTN, hypothyroidism, HLD, and COPD who presented to the ED after she awoke with leg cramping and noted significant nausea, vomiting and generalized abdominal pain since last evening. This has happened sporadically for a few weeks or months, but was more severe than previously and associated with loose stools and abdominal bloating. She was hypertensive and afebrile in the ED with continued vomiting, AKI, leukocytosis and ketonuria. CT showed evidence of small bowel dysmotility, but no obstruction. It also noted diffuse aortic atherosclerosis and severe infrarenal luminal narrowing. Admission was requested for dehydration, AKI due to intractable nausea and vomiting due to presumed viral gastroenteritis.   PMH: CAD s/p  CABG x3 2008, partial colectomy for invasive polyp and take back for postop infection remotely, HTN, hypothyroidism, HLD, and COPD PSH: Colectomy per patient in [place] remotely. SH: Lives with husband, no EtOH or smoking.       Impression/Plan:  Principal Problem:   Viral gastroenteritis Active Problems:   AKI (acute kidney injury) (HCC)   Aortic atherosclerosis (HCC)   CAD (coronary artery disease)   HTN (hypertension)   Hypothyroidism  Intractable nausea, vomiting, diarrhea, abdominal pain: CT with suggestion of dysmotility, no obstruction. Will treat empirically for gastroenteritis. Note pt was seen by GI for postprandial diarrhea in Jan, started on fiber supplement, no further work up planned. - IVF's, NPO, advance diet as tol - IV antiemetics, analgesics -abd tender, consider ischemic bowel issues with infrarenal severe aortic disease.    AKI: SCr down w IVF"s - IVF and recheck BMP - Monitor UOP.   Aortic atherosclerosis with infrarenal luminal narrowing: With h/o CAD, suspect PAD with poor LE perfusion. Has 3 month hx of intermittient severe "cramping" of the LE's.  May be this is due to poor perfusion.   - ABI/TBI pending  CAD: s/p CABG x3 in 2008, followed by cardiology at [facility]. No chest pain, dyspnea.  - Continue home medications ASA, statin  HTN:  - Hold ARB, thiazide, spironolactone with AKI and dehydration.  - Continue cardizem  Asthma:  - Continue singulair, no inhalers at home, no wheezing currently.  Hypothyroidism: Chronic, stable - Continue synthroid  Pyuria: On dirty-catch specimen without suprapubic tenderness, dysuria or other urinary complaints. No intervention planned.     Vinson Moselleob Beautiful Pensyl MD [facility] pgr [phone] 12/19/2017, 2:29 PM    DVT prophylaxis: Heparin  Code Status: Full  Family Communication: none Disposition Plan: Uncertain Consults called: None  Admission status: Observation      Procedures: -none  Consults: -none    Recent Labs  Lab 12/18/17 0134 12/19/17 0541  NA 135 133*  K 4.2 4.2  CL 99* 102  CO2 25 21*  GLUCOSE 144* 97  BUN 31* 18  CREATININE 2.10* 1.45*  CALCIUM 10.6* 9.3   Recent Labs  Lab 12/18/17 0134  AST 30  ALT 20  ALKPHOS 53  BILITOT 0.5  PROT 8.6*  ALBUMIN 4.5   Recent Labs  Lab 12/18/17 0134 12/19/17 0541  WBC 14.0* 11.4*  HGB 15.1* 13.5  HCT 43.5 39.9  MCV 89.5 90.7  PLT 271 267   Iron/TIBC/Ferritin/ %Sat No results found for: IRON, TIBC, FERRITIN, IRONPCTSAT

## 2017-12-19 NOTE — Plan of Care (Signed)
Still complaining of discomfort, but not pain, in abdomen.  No vomiting or nausea so far this shift.

## 2017-12-20 ENCOUNTER — Inpatient Hospital Stay (HOSPITAL_COMMUNITY): Payer: Medicare HMO

## 2017-12-20 ENCOUNTER — Encounter (HOSPITAL_COMMUNITY): Payer: Self-pay | Admitting: Radiology

## 2017-12-20 DIAGNOSIS — R112 Nausea with vomiting, unspecified: Secondary | ICD-10-CM

## 2017-12-20 DIAGNOSIS — R1084 Generalized abdominal pain: Secondary | ICD-10-CM

## 2017-12-20 DIAGNOSIS — R10817 Generalized abdominal tenderness: Secondary | ICD-10-CM

## 2017-12-20 LAB — CBC
HCT: 38 % (ref 36.0–46.0)
HEMOGLOBIN: 12.7 g/dL (ref 12.0–15.0)
MCH: 30.2 pg (ref 26.0–34.0)
MCHC: 33.4 g/dL (ref 30.0–36.0)
MCV: 90.5 fL (ref 78.0–100.0)
Platelets: 252 10*3/uL (ref 150–400)
RBC: 4.2 MIL/uL (ref 3.87–5.11)
RDW: 13.3 % (ref 11.5–15.5)
WBC: 7.2 10*3/uL (ref 4.0–10.5)

## 2017-12-20 LAB — COMPREHENSIVE METABOLIC PANEL
ALBUMIN: 3.6 g/dL (ref 3.5–5.0)
ALK PHOS: 43 U/L (ref 38–126)
ALT: 16 U/L (ref 14–54)
ANION GAP: 11 (ref 5–15)
AST: 26 U/L (ref 15–41)
BUN: 15 mg/dL (ref 6–20)
CALCIUM: 9.3 mg/dL (ref 8.9–10.3)
CO2: 21 mmol/L — AB (ref 22–32)
Chloride: 105 mmol/L (ref 101–111)
Creatinine, Ser: 1.37 mg/dL — ABNORMAL HIGH (ref 0.44–1.00)
GFR calc Af Amer: 40 mL/min — ABNORMAL LOW (ref >60)
GFR calc non Af Amer: 35 mL/min — ABNORMAL LOW (ref >60)
GLUCOSE: 82 mg/dL (ref 65–99)
POTASSIUM: 4.5 mmol/L (ref 3.5–5.1)
SODIUM: 137 mmol/L (ref 135–145)
Total Bilirubin: 0.9 mg/dL (ref 0.3–1.2)
Total Protein: 7.3 g/dL (ref 6.5–8.1)

## 2017-12-20 LAB — LACTIC ACID, PLASMA: Lactic Acid, Venous: 1 mmol/L (ref 0.5–1.9)

## 2017-12-20 MED ORDER — HYDRALAZINE HCL 20 MG/ML IJ SOLN
10.0000 mg | Freq: Four times a day (QID) | INTRAMUSCULAR | Status: DC | PRN
  Administered 2017-12-20 – 2017-12-21 (×2): 20 mg via INTRAVENOUS
  Filled 2017-12-20 (×2): qty 1

## 2017-12-20 MED ORDER — IOPAMIDOL (ISOVUE-370) INJECTION 76%
75.0000 mL | Freq: Once | INTRAVENOUS | Status: AC | PRN
  Administered 2017-12-20: 13:00:00 80 mL via INTRAVENOUS

## 2017-12-20 MED ORDER — IOPAMIDOL (ISOVUE-370) INJECTION 76%
INTRAVENOUS | Status: AC
  Filled 2017-12-20: qty 100

## 2017-12-20 MED ORDER — CLONIDINE HCL 0.2 MG/24HR TD PTWK
0.2000 mg | MEDICATED_PATCH | TRANSDERMAL | Status: DC
  Administered 2017-12-20 – 2017-12-27 (×2): 0.2 mg via TRANSDERMAL
  Filled 2017-12-20 (×2): qty 1

## 2017-12-20 MED ORDER — SODIUM CHLORIDE 0.9 % IV BOLUS (SEPSIS)
500.0000 mL | Freq: Once | INTRAVENOUS | Status: AC
  Administered 2017-12-20: 500 mL via INTRAVENOUS

## 2017-12-20 MED ORDER — SODIUM CHLORIDE 0.9 % IJ SOLN
INTRAMUSCULAR | Status: AC
  Filled 2017-12-20: qty 100

## 2017-12-20 NOTE — Progress Notes (Signed)
Discussed results of CT angio today with radiology.  They feel the study is low risk for mesenteric ischemia.  However, since prior CT, small bowel distension has worsened some and they are concerned about possible small bowel obstruction.    Will move pt to tele bed.  Have consulted gen surgery.  Keep NPO for now.    Vinson Moselleob Eustolia Drennen MD [facility] [phone] 12/20/2017, 3:30 PM

## 2017-12-20 NOTE — Progress Notes (Signed)
[facility] Progress Note  Subjective: still sig diffuse abd pain, unable to eat, vomited with trying to eat  Vitals:   12/19/17 0611 12/19/17 1328 12/19/17 2129 12/20/17 0559  BP: (!) 156/52 (!) 133/56 (!) 198/56 (!) 178/59  Pulse: 71 68 67 78  Resp: 16 15 16 17   Temp: 98.8 F (37.1 C) 97.7 F (36.5 C) 98.2 F (36.8 C) 98.7 F (37.1 C)  TempSrc: Oral Oral Oral Oral  SpO2: 100% 100% 97% 97%  Weight:      Height:        Inpatient medications: . aspirin  81 mg Oral Daily  . diltiazem  240 mg Oral Daily  . heparin injection (subcutaneous)  5,000 Units Subcutaneous Q8H  . levothyroxine  50 mcg Oral QAC breakfast  . montelukast  5 mg Oral QHS  . pravastatin  40 mg Oral Daily   . sodium chloride 75 mL/hr at 12/20/17 0104   fentaNYL (SUBLIMAZE) injection, ondansetron (ZOFRAN) IV, promethazine  Exam: Alert, no distress No  jvd Chext cta bilat RRR Abd distended, firm, diffuse mild tenderness, no rebound, +BS Ext dec'd pulses in feet, no gangrene  NF Ox 3   Brief Summary: Katie [name] is an 84 y.o. female with a history of CAD s/p CABG x3 2008, partial colectomy for invasive polyp and take back for postop infection remotely, HTN, hypothyroidism, HLD, and COPD who presented to the ED after she awoke with leg cramping and noted significant nausea, vomiting and generalized abdominal pain since last evening. This has happened sporadically for a few weeks or months, but was more severe than previously and associated with loose stools and abdominal bloating. She was hypertensive and afebrile in the ED with continued vomiting, AKI, leukocytosis and ketonuria. CT showed evidence of small bowel dysmotility, but no obstruction. It also noted diffuse aortic atherosclerosis and severe infrarenal luminal narrowing. Admission was requested for dehydration, AKI due to intractable nausea and vomiting due to presumed viral gastroenteritis. PMH: CAD s/p CABG x3 2008, partial colectomy for  invasive polyp and take back for postop infection remotely, HTN, hypothyroidism, HLD, and COPD       Impression/Plan:  Principal Problem:   Abdominal pain, acute   Intractable nausea and vomiting Active Problems:   AKI (acute kidney injury) (HCC)   Aortic atherosclerosis (HCC)   CAD (coronary artery disease)   HTN (hypertension)   Hypothyroidism  Acute on chronic abdominal pain/ intract nausea + vomiting: remains in pain, quite tender abdomen, distended, concern for something potentially more serious. Has had abd pain for 3 months, vomiting is new and diarrhea more chronic.  Concerned about possible ischemia, will get CT angio of abd/ pelvis given aortic disease noted on noncon abd CT  (have d/w vasc surg).  - IVF's, keep her NPO for now - IV antiemetics, analgesics    AKI: SCr down w IVF"s - not eating yet, cont IVF and recheck BMP - Monitor UOP.   Aortic atherosclerosis with infrarenal luminal narrowing: With h/o CAD, suspect PAD with poor LE perfusion. Has 3 month hx of intermittient severe "cramping" of the LE's - ABI/TBI done , interp pending  CAD: s/p CABG x3 in 2008, followed by cardiology at [facility]. No chest pain, dyspnea.  - Continue home medications ASA, statin  HTN:  - Hold ARB, thiazide, spironolactone with AKI and dehydration.  - hold po meds - IV hydralazine prn - clonidine patch for now  Asthma:  - Continue singulair, no inhalers at home, no wheezing  currently.  Hypothyroidism: Chronic, stable - Continue synthroid  Pyuria: On dirty-catch specimen without suprapubic tenderness, dysuria or other urinary complaints. No intervention planned.     Vinson Moselleob Sherine Cortese MD [facility] pgr [phone] 12/19/2017, 2:29 PM    DVT prophylaxis: Heparin  Code Status: Full  Family Communication: none Disposition Plan: Uncertain Consults called: None  Admission status: Observation     Procedures: -none  Consults: -none    Recent Labs   Lab 12/18/17 0134 12/19/17 0541  NA 135 133*  K 4.2 4.2  CL 99* 102  CO2 25 21*  GLUCOSE 144* 97  BUN 31* 18  CREATININE 2.10* 1.45*  CALCIUM 10.6* 9.3   Recent Labs  Lab 12/18/17 0134  AST 30  ALT 20  ALKPHOS 53  BILITOT 0.5  PROT 8.6*  ALBUMIN 4.5   Recent Labs  Lab 12/18/17 0134 12/19/17 0541  WBC 14.0* 11.4*  HGB 15.1* 13.5  HCT 43.5 39.9  MCV 89.5 90.7  PLT 271 267   Iron/TIBC/Ferritin/ %Sat No results found for: IRON, TIBC, FERRITIN, IRONPCTSAT
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
## 2017-12-20 NOTE — Progress Notes (Signed)
Report called to [employer] RN 5 east

## 2017-12-20 NOTE — Plan of Care (Signed)
Plan of care reviewed and discussed with patient and husband. 

## 2017-12-21 ENCOUNTER — Inpatient Hospital Stay (HOSPITAL_COMMUNITY): Payer: Medicare HMO

## 2017-12-21 LAB — GASTROINTESTINAL PANEL BY PCR, STOOL (REPLACES STOOL CULTURE)
Adenovirus F40/41: NOT DETECTED
Astrovirus: NOT DETECTED
CYCLOSPORA CAYETANENSIS: NOT DETECTED
Campylobacter species: NOT DETECTED
Cryptosporidium: NOT DETECTED
ENTAMOEBA HISTOLYTICA: NOT DETECTED
Enteroaggregative E coli (EAEC): NOT DETECTED
Enteropathogenic E coli (EPEC): NOT DETECTED
Enterotoxigenic E coli (ETEC): NOT DETECTED
Giardia lamblia: NOT DETECTED
NOROVIRUS GI/GII: NOT DETECTED
Plesimonas shigelloides: NOT DETECTED
Rotavirus A: NOT DETECTED
SALMONELLA SPECIES: NOT DETECTED
SHIGELLA/ENTEROINVASIVE E COLI (EIEC): NOT DETECTED
Sapovirus (I, II, IV, and V): NOT DETECTED
Shiga like toxin producing E coli (STEC): NOT DETECTED
VIBRIO CHOLERAE: NOT DETECTED
VIBRIO SPECIES: NOT DETECTED
Yersinia enterocolitica: NOT DETECTED

## 2017-12-21 LAB — CBC
HEMATOCRIT: 33.8 % — AB (ref 36.0–46.0)
HEMOGLOBIN: 11.6 g/dL — AB (ref 12.0–15.0)
MCH: 31 pg (ref 26.0–34.0)
MCHC: 34.3 g/dL (ref 30.0–36.0)
MCV: 90.4 fL (ref 78.0–100.0)
Platelets: 228 10*3/uL (ref 150–400)
RBC: 3.74 MIL/uL — ABNORMAL LOW (ref 3.87–5.11)
RDW: 13.3 % (ref 11.5–15.5)
WBC: 6 10*3/uL (ref 4.0–10.5)

## 2017-12-21 LAB — BASIC METABOLIC PANEL
ANION GAP: 15 (ref 5–15)
BUN: 15 mg/dL (ref 6–20)
CALCIUM: 9 mg/dL (ref 8.9–10.3)
CO2: 16 mmol/L — AB (ref 22–32)
CREATININE: 1.38 mg/dL — AB (ref 0.44–1.00)
Chloride: 108 mmol/L (ref 101–111)
GFR calc Af Amer: 40 mL/min — ABNORMAL LOW (ref >60)
GFR, EST NON AFRICAN AMERICAN: 35 mL/min — AB (ref >60)
GLUCOSE: 74 mg/dL (ref 65–99)
Potassium: 3.9 mmol/L (ref 3.5–5.1)
Sodium: 139 mmol/L (ref 135–145)

## 2017-12-21 LAB — TROPONIN I: Troponin I: 0.06 ng/mL (ref <0.03)

## 2017-12-21 MED ORDER — LABETALOL HCL 5 MG/ML IV SOLN
5.0000 mg | INTRAVENOUS | Status: AC | PRN
  Administered 2017-12-21 (×2): 5 mg via INTRAVENOUS
  Filled 2017-12-21: qty 4

## 2017-12-21 MED ORDER — FENTANYL CITRATE (PF) 100 MCG/2ML IJ SOLN
12.5000 ug | Freq: Once | INTRAMUSCULAR | Status: AC
  Administered 2017-12-21: 12.5 ug via INTRAVENOUS

## 2017-12-21 MED ORDER — LABETALOL HCL 5 MG/ML IV SOLN
10.0000 mg | Freq: Once | INTRAVENOUS | Status: AC
  Administered 2017-12-21: 10 mg via INTRAVENOUS
  Filled 2017-12-21: qty 4

## 2017-12-21 MED ORDER — FENTANYL CITRATE (PF) 100 MCG/2ML IJ SOLN
12.5000 ug | Freq: Once | INTRAMUSCULAR | Status: AC
  Administered 2017-12-21: 12.5 ug via INTRAVENOUS
  Filled 2017-12-21: qty 2

## 2017-12-21 NOTE — Progress Notes (Signed)
Pt's BP this PM is 210/58 (manual). 20mg  hydralazine given. Will reassess and continue to monitor.
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
## 2017-12-21 NOTE — Progress Notes (Signed)
CRITICAL VALUE ALERT  Critical Value: Troponin- 0.06  Date & Time Notied: 12/21/2017 @ 2310  Provider Notified: Kirtland BouchardK. Schorr @2312   Orders Received/Actions taken: MD notified

## 2017-12-21 NOTE — Progress Notes (Signed)
Patients daughter Luster LandsbergRenee updated via telephone after permission and request by patient was made to update daughter. Contact info added to emergency contacts.

## 2017-12-21 NOTE — Care Management Note (Signed)
Case Management Note  Patient Details  Name: Katie [name] MRN: [id number] Date of Birth: 06/14/1933  Subjective/Objective:                  sbo  Action/Plan: Date: December 21, 2017 [name], BSN, [place]3, [place] [phone] Chart and notes review for patient progress and needs. Will follow for case management and discharge needs. No cm or discharge needs present at time of this review. Next review date: [id number]  Expected Discharge Date:                  Expected Discharge Plan:  Home/Self Care  In-House Referral:     Discharge planning Services  CM Consult  Post Acute Care Choice:    Choice offered to:     DME Arranged:    DME Agency:     HH Arranged:    HH Agency:     Status of Service:  In process, will continue to follow  If discussed at Long Length of Stay Meetings, dates discussed:    Additional Comments:  [name], [name], RN 12/21/2017, 9:03 AM

## 2017-12-21 NOTE — Progress Notes (Signed)
PROGRESS NOTE    Katie MorinMary Agnes [name]  ION:[id number] DOB: 07-14-1933 DOA: 12/18/2017 PCP: Macy MisBriscoe, Kim K, MD  Brief Narrative 84 y.o. female with a history of CAD s/p CABG x3 2008, partial colectomy for invasive polyp and take back for postop infection remotely, HTN, hypothyroidism, HLD, and COPD who presented to the ED after she awoke with leg cramping and noted significant nausea, vomiting and generalized abdominal pain since last evening. This has happened sporadically for a few weeks, but was more severe than previously and associated with loose stools and abdominal bloating. She was hypertensive and afebrile in the ED with continued vomiting, AKI, leukocytosis and ketonuria. CT showed evidence of small bowel dysmotility, but no obstruction. It also noted diffuse aortic atherosclerosis and severe infrarenal luminal narrowing. Admission was requested for dehydration, AKI due to intractable nausea and vomiting due to presumed viral gastroenteritis.  Review of Systems: She denies any suspicious ingestion, fever, sick contacts, hematemesis, hematochezia, melena. She had been eating less that day as well. She has had the leg cramping that awakens her at night for months without any directed treatment, denying claudication, and per HPI. All others reviewed and are negative.     Assessment & Plan:   Principal Problem:   Viral gastroenteritis Active Problems:   AKI (acute kidney injury) (HCC)   Aortic atherosclerosis (HCC)   CAD (coronary artery disease)   HTN (hypertension)   Hypothyroidism   UTI (urinary tract infection)   Generalized abdominal pain   Nausea and vomiting   Generalized abdominal tenderness  Intractable nausea, vomiting, diarrhea, abdominal pain: CT with suggestion of dysmotility, no obstruction. Will treat empirically for gastroenteritis. Note pt was seen by GI for postprandial giarrhea in Jan, started on fiber supplement, no further work up planned. - IVF's, NPO, will attempt  meds by mouth this morning. Can advance diet to clears when patient is ready/symptoms better controlled. - IV antiemetics, analgesics -kub shows improving partial sbo. -ambulate/oob   AKI: SCr 1.38due to dehydration.  - IVF and recheck BMP - Monitor UOP.   Aortic atherosclerosis with infrarenal luminal narrowing: With h/o CAD, suspect PAD with poor LE perfusion, though denies claudication symptoms. - ABI/TBI  CAD: s/p CABG x3 in 2008, [facility]. No chest pain, dyspnea.  - Continue home medications ASA, statin  HTN:  - Hold ARB, thiazide, spironolactone with AKI and dehydration.  - Continue cardizem  Asthma:  - Continue singulair, no inhalers at home, no wheezing currently  Hypothyroidism: Chronic, stable - Continue synthroid  Pyuria: On dirty-catch specimen without suprapubic tenderness, dysuria or other urinary complaints. No intervention planned.      DVT prophylaxis: Heparin Code Status: Full code Family Communication: Husband was in the room when I was talking to the patient. Disposition Plan: TBD Consultants:  Surgery Procedures: None Antimicrobials none  Subjective: Still has abdominal pain and diarrhea.   Objective: Vitals:   12/20/17 1447 12/20/17 2303 12/20/17 2341 12/21/17 0506  BP: (!) 181/58 (!) 202/66 (!) 163/44 (!) 162/49  Pulse: 76 88 (!) 101 97  Resp: 15 20 20 18   Temp: 97.8 F (36.6 C) 98.2 F (36.8 C)  98.5 F (36.9 C)  TempSrc: Oral Oral  Oral  SpO2: 100% 100% 100% 99%  Weight:      Height:        Intake/Output Summary (Last 24 hours) at 12/21/2017 1021 Last data filed at 12/21/2017 0600 Gross per 24 hour  Intake 2980 ml  Output 550 ml  Net  2430 ml   Filed Weights   12/18/17 0651  Weight: 63.1 kg (139 lb 1.8 oz)    Examination:  General exam: Appears calm and comfortable  Respiratory system: Clear to auscultation. Respiratory effort normal. Cardiovascular system: S1 & S2 heard, RRR. No JVD,  murmurs, rubs, gallops or clicks. No pedal edema. Gastrointestinal system: Abdomen is distended, soft and tender. No organomegaly or masses felt. Normal bowel sounds heard. Central nervous system: Alert and oriented. No focal neurological deficits. Extremities: Symmetric 5 x 5 power. Skin: No rashes, lesions or ulcers Psychiatry: Judgement and insight appear normal. Mood & affect appropriate.     Data Reviewed: I have personally reviewed following labs and imaging studies  CBC: Recent Labs  Lab 12/18/17 0134 12/19/17 0541 12/20/17 1134 12/21/17 0600  WBC 14.0* 11.4* 7.2 6.0  HGB 15.1* 13.5 12.7 11.6*  HCT 43.5 39.9 38.0 33.8*  MCV 89.5 90.7 90.5 90.4  PLT 271 267 252 228   Basic Metabolic Panel: Recent Labs  Lab 12/18/17 0134 12/19/17 0541 12/20/17 1134 12/21/17 0600  NA 135 133* 137 139  K 4.2 4.2 4.5 3.9  CL 99* 102 105 108  CO2 25 21* 21* 16*  GLUCOSE 144* 97 82 74  BUN 31* 18 15 15   CREATININE 2.10* 1.45* 1.37* 1.38*  CALCIUM 10.6* 9.3 9.3 9.0   GFR: Estimated Creatinine Clearance: 24 mL/min (A) (by C-G formula based on SCr of 1.38 mg/dL (H)). Liver Function Tests: Recent Labs  Lab 12/18/17 0134 12/20/17 1134  AST 30 26  ALT 20 16  ALKPHOS 53 43  BILITOT 0.5 0.9  PROT 8.6* 7.3  ALBUMIN 4.5 3.6   Recent Labs  Lab 12/18/17 0134  LIPASE 47   No results for input(s): AMMONIA in the last 168 hours. Coagulation Profile: No results for input(s): INR, PROTIME in the last 168 hours. Cardiac Enzymes: No results for input(s): CKTOTAL, CKMB, CKMBINDEX, TROPONINI in the last 168 hours. BNP (last 3 results) No results for input(s): PROBNP in the last 8760 hours. HbA1C: No results for input(s): HGBA1C in the last 72 hours. CBG: Recent Labs  Lab 12/18/17 2210  GLUCAP 99   Lipid Profile: No results for input(s): CHOL, HDL, LDLCALC, TRIG, CHOLHDL, LDLDIRECT in the last 72 hours. Thyroid Function Tests: No results for input(s): TSH, T4TOTAL, FREET4,  T3FREE, THYROIDAB in the last 72 hours. Anemia Panel: No results for input(s): VITAMINB12, FOLATE, FERRITIN, TIBC, IRON, RETICCTPCT in the last 72 hours. Sepsis Labs: Recent Labs  Lab 12/20/17 1134  LATICACIDVEN 1.0    No results found for this or any previous visit (from the past 240 hour(s)).       Radiology Studies:       Scheduled Meds: . aspirin  81 mg Oral Daily  . cloNIDine  0.2 mg Transdermal Weekly  . heparin injection (subcutaneous)  5,000 Units Subcutaneous Q8H  . levothyroxine  50 mcg Oral QAC breakfast   Continuous Infusions: . sodium chloride 1,000 mL (12/21/17 0553)     LOS: 2 days      Alwyn Ren, MD [facility]  If 7PM-7AM, please contact night-coverage [email or web address] Password [facility] 12/21/2017, 10:21 AM

## 2017-12-21 NOTE — Progress Notes (Signed)
CC: Abdominal pain, cramping, postprandial diarrhea, nausea and vomiting  Subjective: Patient says she slept well last evening she says her stomach feels a little bit better.  She still having multiple stools.  Not eating.  Exam is unchanged from yesterday.  Objective: Vital signs in last 24 hours: Temp:  [97.8 F (36.6 C)-98.5 F (36.9 C)] 98.5 F (36.9 C) (02/13 0506) Pulse Rate:  [76-101] 97 (02/13 0506) Resp:  [15-20] 18 (02/13 0506) BP: (162-202)/(44-66) 162/49 (02/13 0506) SpO2:  [99 %-100 %] 99 % (02/13 0506) Last BM Date: 12/20/17 NPO 3130 IV 550 urine second shift recorded BM x 6 recorded for yesterday/5 stools listed for 12/19/17 Afebrile  VSS   Creatinine 1.38 - improving WBC is normal  H/H down to: 11/33.8 - on admit 15/43.5 on 12/17/17 GI panel/plain films pending  CT 2/12/129:  There may be significant narrowing at the origin of the celiac axis and IMA, but there is no significant narrowing involving the SMA. There is plaque involving the SMA which is scattered. There is no evidence of acute thrombosis or vessel occlusion in the mesenteric Vasculature.  The stomach is decompressed. There are several small bowel loops which are moderately dilated with air-fluid levels.  Distal small bowel loops in the mid abdomen adjacent to the anastomosis to transverse colon are decompressed. The colon is decompressed. The patient is status post right hemicolectomy. No obvious mass in the colon.   Intake/Output from previous day: 02/12 0701 - 02/13 0700 In: 3130 [I.V.:3130] Out: 550 [Urine:550] Intake/Output this shift: No intake/output data recorded.  General appearance: alert, cooperative and no distress Resp: clear to auscultation bilaterally GI: Still somewhat distended.  He is tender to palpation all over.  No focal tenderness.  Positive bowel sounds.  She is having loose stools multiple times a day.  Lab Results:  Recent Labs    12/20/17 1134 12/21/17 0600  WBC  7.2 6.0  HGB 12.7 11.6*  HCT 38.0 33.8*  PLT 252 228    BMET Recent Labs    12/20/17 1134 12/21/17 0600  NA 137 139  K 4.5 3.9  CL 105 108  CO2 21* 16*  GLUCOSE 82 74  BUN 15 15  CREATININE 1.37* 1.38*  CALCIUM 9.3 9.0   PT/INR No results for input(s): LABPROT, INR in the last 72 hours.  Recent Labs  Lab 12/18/17 0134 12/20/17 1134  AST 30 26  ALT 20 16  ALKPHOS 53 43  BILITOT 0.5 0.9  PROT 8.6* 7.3  ALBUMIN 4.5 3.6     Lipase     Component Value Date/Time   LIPASE 47 12/18/2017 0134     Medications: . aspirin  81 mg Oral Daily  . cloNIDine  0.2 mg Transdermal Weekly  . heparin injection (subcutaneous)  5,000 Units Subcutaneous Q8H  . levothyroxine  50 mcg Oral QAC breakfast   . sodium chloride 1,000 mL (12/21/17 0553)   Assessment/Plan Possible SBO/possible mesenteric ischemia Abdominal pain, cramps and diarrhea times 3 months. New-onset nausea and vomiting  History of polyps/polypectomy with colon perforation/partial colectomy CAD with history of MI/[place] in [place]  Peripheral vascular occlusive disease with ABIs 0.55 on the right, 0.26 on the left History of tobacco use/asthma Hypertension Hypothyroid  FEN: IV fluids/NPO ID:  none DVT: Heparin Foley: None Follow up: TBD  Plan: Her abdominal discomfort was better last evening.  She remains somewhat distended and tender to palpation.  Pain is not related  to oral intake.  She is having multiple loose stools.  GI panel and repeat films are pending this a.m.  We have also recommended GI consult, currently I do not see any surgical issues.  From our standpoint nothing surgical advance diet as tolerated.  GI panel is negative, Film shows some distended small bowel loops smaller than CT on 12/19/17.    GI panel is negative  LOS: 2 days    Laryn Venning 12/21/2017 [phone]

## 2017-12-21 NOTE — Progress Notes (Addendum)
Rapid Response Event Note  Overview:  RR-RN called to room 1508 for patient complaining of chest pain.   Initial Focused Assessment: Pt lying in bed. Initial VS 195/53, HR 105, RR 24, 98% on room air. Pt states she is having 9/10 pain in her chest, neck, right arm, and right wrist. Pt states "it feels like bumps popping up on chest." Pt states "I think I feel this way because of the hydralazine, the same thing happened last night." Following RR protocol, 2LNC applied although patient oxygen saturation was 98% on room air. Pt stated "the oxygen is making my nose burn." Oxygen was removed. Skin assessed and no bumps were noted or felt on patients neck, right arm, or chest. Lungs are clear in the upper right and left lobes. No adventitious heart sounds. SL Nitro not given due to patient HR being 108 bpm. NP notified in regards to patient symptoms and made aware that Hydralazine had been given.  2240: Pt states pain is 6/10. BP remains elevated, see flow sheet. NP notified.  2330: Pt states pain is a 4/10. BP remains elevated, see flow sheet. NP aware.  Interventions: EKG completed Hydralazine given at 2114 Fentanyl given at 2213, per NP Order received for Troponin blood draw > Trop 0.06, NP made aware by primary RN Orders received for 5mg  IV Labetolol x2 doses, additional 1x dose of 10 mg IV Labetolol ordered to be given with 12.5 mcg IV Fentanyl  Plan of Care (if not transferred): RN to notify NP if SBP remains >190 following 3rd dose of Labetolol when given with Fentanyl. RN to call RR-RN for further needs.   Event Summary:  Jennye MoccasinShelby L Zayla Agar

## 2017-12-22 ENCOUNTER — Inpatient Hospital Stay (HOSPITAL_COMMUNITY): Payer: Medicare HMO

## 2017-12-22 DIAGNOSIS — R079 Chest pain, unspecified: Secondary | ICD-10-CM

## 2017-12-22 DIAGNOSIS — I361 Nonrheumatic tricuspid (valve) insufficiency: Secondary | ICD-10-CM

## 2017-12-22 DIAGNOSIS — I340 Nonrheumatic mitral (valve) insufficiency: Secondary | ICD-10-CM

## 2017-12-22 DIAGNOSIS — I739 Peripheral vascular disease, unspecified: Secondary | ICD-10-CM

## 2017-12-22 LAB — ECHOCARDIOGRAM COMPLETE
Height: 57 in
Weight: 2225.76 oz

## 2017-12-22 LAB — CBC
HEMATOCRIT: 31.7 % — AB (ref 36.0–46.0)
HEMOGLOBIN: 10.8 g/dL — AB (ref 12.0–15.0)
MCH: 30.7 pg (ref 26.0–34.0)
MCHC: 34.1 g/dL (ref 30.0–36.0)
MCV: 90.1 fL (ref 78.0–100.0)
Platelets: 207 10*3/uL (ref 150–400)
RBC: 3.52 MIL/uL — AB (ref 3.87–5.11)
RDW: 13.5 % (ref 11.5–15.5)
WBC: 6.4 10*3/uL (ref 4.0–10.5)

## 2017-12-22 LAB — TROPONIN I
TROPONIN I: 0.08 ng/mL — AB (ref <0.03)
Troponin I: 0.14 ng/mL (ref <0.03)
Troponin I: 0.11 ng/mL (ref <0.03)
Troponin I: 0.07 ng/mL (ref <0.03)

## 2017-12-22 LAB — COMPREHENSIVE METABOLIC PANEL
ALBUMIN: 2.8 g/dL — AB (ref 3.5–5.0)
ALK PHOS: 32 U/L — AB (ref 38–126)
ALT: 17 U/L (ref 14–54)
AST: 29 U/L (ref 15–41)
Anion gap: 9 (ref 5–15)
BILIRUBIN TOTAL: 0.6 mg/dL (ref 0.3–1.2)
BUN: 15 mg/dL (ref 6–20)
CO2: 16 mmol/L — ABNORMAL LOW (ref 22–32)
Calcium: 8.4 mg/dL — ABNORMAL LOW (ref 8.9–10.3)
Chloride: 110 mmol/L (ref 101–111)
Creatinine, Ser: 1.32 mg/dL — ABNORMAL HIGH (ref 0.44–1.00)
GFR calc Af Amer: 42 mL/min — ABNORMAL LOW (ref >60)
GFR calc non Af Amer: 36 mL/min — ABNORMAL LOW (ref >60)
GLUCOSE: 71 mg/dL (ref 65–99)
POTASSIUM: 3.6 mmol/L (ref 3.5–5.1)
Sodium: 135 mmol/L (ref 135–145)
TOTAL PROTEIN: 5.9 g/dL — AB (ref 6.5–8.1)

## 2017-12-22 LAB — C DIFFICILE QUICK SCREEN W PCR REFLEX
C DIFFICILE (CDIFF) INTERP: NOT DETECTED
C Diff antigen: NEGATIVE
C Diff toxin: NEGATIVE

## 2017-12-22 LAB — HEPARIN LEVEL (UNFRACTIONATED): Heparin Unfractionated: 1.04 IU/mL — ABNORMAL HIGH (ref 0.30–0.70)

## 2017-12-22 MED ORDER — IRBESARTAN-HYDROCHLOROTHIAZIDE 150-12.5 MG PO TABS: 2.0000 | ORAL_TABLET | Freq: Every day | ORAL | Status: DC

## 2017-12-22 MED ORDER — ISOSORB DINITRATE-HYDRALAZINE 20-37.5 MG PO TABS
1.0000 | ORAL_TABLET | Freq: Three times a day (TID) | ORAL | Status: DC
  Administered 2017-12-22 (×2): 1 via ORAL
  Filled 2017-12-22 (×4): qty 1

## 2017-12-22 MED ORDER — SPIRONOLACTONE 25 MG PO TABS
25.0000 mg | ORAL_TABLET | Freq: Every day | ORAL | Status: DC
  Administered 2017-12-22: 25 mg via ORAL
  Filled 2017-12-22: qty 1

## 2017-12-22 MED ORDER — LORAZEPAM 1 MG PO TABS
1.0000 mg | ORAL_TABLET | Freq: Once | ORAL | Status: AC
  Administered 2017-12-22: 1 mg via ORAL
  Filled 2017-12-22: qty 1

## 2017-12-22 MED ORDER — DILTIAZEM HCL ER COATED BEADS 240 MG PO CP24
240.0000 mg | ORAL_CAPSULE | Freq: Every day | ORAL | Status: DC
  Administered 2017-12-22: 240 mg via ORAL
  Filled 2017-12-22: qty 1

## 2017-12-22 MED ORDER — HYDROCHLOROTHIAZIDE 25 MG PO TABS
25.0000 mg | ORAL_TABLET | Freq: Every day | ORAL | Status: DC
  Administered 2017-12-22: 25 mg via ORAL
  Filled 2017-12-22: qty 1

## 2017-12-22 MED ORDER — IRBESARTAN 150 MG PO TABS
300.0000 mg | ORAL_TABLET | Freq: Every day | ORAL | Status: DC
  Administered 2017-12-22: 300 mg via ORAL
  Filled 2017-12-22: qty 2

## 2017-12-22 MED ORDER — CLONIDINE HCL 0.1 MG PO TABS
0.2000 mg | ORAL_TABLET | Freq: Once | ORAL | Status: AC
  Administered 2017-12-22: 0.2 mg via ORAL
  Filled 2017-12-22: qty 2

## 2017-12-22 MED ORDER — HEPARIN (PORCINE) IN NACL 100-0.45 UNIT/ML-% IJ SOLN
600.0000 [IU]/h | INTRAMUSCULAR | Status: DC
  Administered 2017-12-22: 600 [IU]/h via INTRAVENOUS
  Filled 2017-12-22: qty 250

## 2017-12-22 MED ORDER — ATORVASTATIN CALCIUM 40 MG PO TABS
80.0000 mg | ORAL_TABLET | Freq: Every day | ORAL | Status: DC
  Administered 2017-12-22: 80 mg via ORAL
  Filled 2017-12-22: qty 2

## 2017-12-22 MED ORDER — HEPARIN (PORCINE) IN NACL 100-0.45 UNIT/ML-% IJ SOLN
750.0000 [IU]/h | INTRAMUSCULAR | Status: DC
  Administered 2017-12-22: 750 [IU]/h via INTRAVENOUS
  Filled 2017-12-22: qty 250

## 2017-12-22 NOTE — Progress Notes (Addendum)
    CC: possible SBO  Subjective:  She is being evaluated for cardiac issues now in addition to her other issues.  She is having multiple BM's per day.  Films show some small bowel loops mildly dilated and there is still calling it mild to distal small bowel obstruction without significant change.    Objective: Vital signs in last 24 hours: Temp:  [97.3 F (36.3 C)-98.6 F (37 C)] 97.3 F (36.3 C) (02/14 0429) Pulse Rate:  [78-105] 78 (02/14 0429) Resp:  [16-24] 18 (02/14 0429) BP: (133-222)/(48-67) 153/51 (02/14 0429) SpO2:  [94 %-100 %] 94 % (02/14 0429) Last BM Date: 12/22/17 Nothing PO 3200 IV Voided x 3  Bm X 7 Afebrile, VSS K+ 3.6, creatinine is stable Mild troponin elevations  0.06/0.14/0.11 - cardiology on board GI panel negative I don't see the C diff yet - it is in active orders   Intake/Output from previous day: 02/13 0701 - 02/14 0700 In: 3146.9 [I.V.:3146.9] Out: -  Intake/Output this shift: No intake/output data recorded.  General appearance: confused and quiet this AM, no complaints on exam GI: soft, some on going distension, she says it feels better than yesterday. few BS, multiple stools.  Lab Results:  Recent Labs    12/21/17 0600 12/22/17 0408  WBC 6.0 6.4  HGB 11.6* 10.8*  HCT 33.8* 31.7*  PLT 228 207    BMET Recent Labs    12/21/17 0600 12/22/17 0408  NA 139 135  K 3.9 3.6  CL 108 110  CO2 16* 16*  GLUCOSE 74 71  BUN 15 15  CREATININE 1.38* 1.32*  CALCIUM 9.0 8.4*   PT/INR No results for input(s): LABPROT, INR in the last 72 hours.  Recent Labs  Lab 12/18/17 0134 12/20/17 1134 12/22/17 0408  AST 30 26 29   ALT 20 16 17   ALKPHOS 53 43 32*  BILITOT 0.5 0.9 0.6  PROT 8.6* 7.3 5.9*  ALBUMIN 4.5 3.6 2.8*     Lipase     Component Value Date/Time   LIPASE 47 12/18/2017 0134     Medications: . aspirin  81 mg Oral Daily  . atorvastatin  80 mg Oral q1800  . cloNIDine  0.2 mg Transdermal Weekly  . diltiazem  240 mg  Oral Daily  . irbesartan  300 mg Oral Daily   And  . hydrochlorothiazide  25 mg Oral Daily  . levothyroxine  50 mcg Oral QAC breakfast  . spironolactone  25 mg Oral Daily   . sodium chloride 75 mL/hr at 12/21/17 2325  . heparin 750 Units/hr (12/22/17 [id number])    Assessment/Plan Possible SBO/possible mesenteric ischemia Abdominal pain, cramps and diarrhea times 3 months. New-onset nausea and vomiting  New chest Pain with mild troponin elevation - cardiology following - no heparin History of polyps/polypectomy with colon perforation/partial colectomy CAD with history of MI/prior stents/[place]AB[place] x3 in [place] -[place] cardiologist [place] in [place]  Peripheral vascular occlusive disease with ABIs 0.55 on the right, 0.26 on the left History of tobacco use/asthma Hypertension Hypothyroid  FEN: IV fluids/NPO ID:  none DVT: Heparin drip Foley: None Follow up: TBD  Plan:  Will consider SB protocol after she is cleared by cardiology.  [facility] our team will see her there.           LOS: 3 days    Katie [name] 12/22/2017 [phone]

## 2017-12-22 NOTE — Progress Notes (Signed)
This PM pt's BP was elevated, checked it manually and it was 210/58. Verified with on-call MD to give 20mg  hydralazine. Pt started to complain of chest tightness and right arm tightness/pain ~7330min after administration. Assessed BP again and it was still elevated, did an EKG and called RRT. Pt stated her pain in her arm and chest was a 9/10. Gave PRN fentanyl for pain and contacted the K. Schorr. MD put in 2x dose of 5mg  labetalol and 1x dose of 10mg  labetalol. Last BP at 2354 was 193/59. Updated Schorr about last BP. Continuing to monitor pt.

## 2017-12-22 NOTE — Progress Notes (Signed)
ANTICOAGULATION CONSULT NOTE -   Pharmacy Consult for Heparin Indication: chest pain/ACS  Allergies  Allergen Reactions  . Clams [Shellfish Allergy] Anaphylaxis  . Other Other (See Comments)    Red Dye "Bright Color Medicines" Constipation swelling  . Red Dye Other (See Comments)    Constipation     Patient Measurements: Height: 4\' 9"  (144.8 cm) Weight: 139 lb 1.8 oz (63.1 kg) IBW/kg (Calculated) : 38.6 Heparin Dosing Weight:   Vital Signs: Temp: 97.7 F (36.5 C) (02/14 1334) Temp Source: Oral (02/14 1334) BP: 162/42 (02/14 1334) Pulse Rate: 73 (02/14 1334)  Labs: Recent Labs    12/20/17 1134 12/21/17 0600  12/22/17 0408 12/22/17 0955 12/22/17 1512  HGB 12.7 11.6*  --  10.8*  --   --   HCT 38.0 33.8*  --  31.7*  --   --   PLT 252 228  --  207  --   --   HEPARINUNFRC  --   --   --   --   --  1.04*  CREATININE 1.37* 1.38*  --  1.32*  --   --   TROPONINI  --   --    < > 0.14* 0.11* 0.08*   < > = values in this interval not displayed.    Estimated Creatinine Clearance: 25.1 mL/min (A) (by C-G formula based on SCr of 1.32 mg/dL (H)).   Medical History: Past Medical History:  Diagnosis Date  . CHF (congestive heart failure) (HCC)   . Coronary artery disease   . Hypertension   . Hypothyroidism     Medications:  Infusions:  . sodium chloride 75 mL/hr at 12/21/17 2325  . heparin      Assessment: 84 year old female with coronary artery disease status post CABG,  Troponin elevation. Heparin per pharmacy.  12/22/2017 heparin level 1.04, supratherapeutic H/H slightly low plts WNL No bleeding or other issues per RN   Goal of Therapy:  Heparin level 0.3-0.7 units/ml Monitor platelets by anticoagulation protocol: Yes   Plan:  Hold heparin drip for 1 hour then resume heparin drip at 600 units/hr Heparin level in 8 hours Daily CBC     Arley PhenixEllen Kaiana Marion RPh 12/22/2017, 4:30 PM Pager [phone]

## 2017-12-22 NOTE — Progress Notes (Signed)
  Echocardiogram 2D Echocardiogram has been performed.  Izzy Doubek T Valerya Maxton 12/22/2017, 2:33 PM

## 2017-12-22 NOTE — Progress Notes (Signed)
ANTICOAGULATION CONSULT NOTE - Initial Consult  Pharmacy Consult for Heparin Indication: chest pain/ACS  Allergies  Allergen Reactions  . Clams [Shellfish Allergy] Anaphylaxis  . Other Other (See Comments)    Red Dye "Bright Color Medicines" Constipation swelling  . Red Dye Other (See Comments)    Constipation     Patient Measurements: Height: 4\' 9"  (144.8 cm) Weight: 139 lb 1.8 oz (63.1 kg) IBW/kg (Calculated) : 38.6 Heparin Dosing Weight:   Vital Signs: Temp: 97.3 F (36.3 C) (02/14 0429) Temp Source: Oral (02/14 0429) BP: 153/51 (02/14 0429) Pulse Rate: 78 (02/14 0429)  Labs: Recent Labs    12/20/17 1134 12/21/17 0600 12/21/17 2224 12/22/17 0408  HGB 12.7 11.6*  --  10.8*  HCT 38.0 33.8*  --  31.7*  PLT 252 228  --  207  CREATININE 1.37* 1.38*  --  1.32*  TROPONINI  --   --  0.06* 0.14*    Estimated Creatinine Clearance: 25.1 mL/min (A) (by C-G formula based on SCr of 1.32 mg/dL (H)).   Medical History: Past Medical History:  Diagnosis Date  . CHF (congestive heart failure) (HCC)   . Coronary artery disease   . Hypertension   . Hypothyroidism     Medications:  Infusions:  . sodium chloride 75 mL/hr at 12/21/17 2325  . heparin      Assessment: Patient with ACS.  Prior SQ heparin charted, therefore will not bolus heparin.  Heparin per pharmacy ordered.  Goal of Therapy:  Heparin level 0.3-0.7 units/ml Monitor platelets by anticoagulation protocol: Yes   Plan:  Heparin drip at 750 units/hr Daily CBC Next heparin level at [address]    Tremar Wickens Jr, TroyJulian Crowford 12/22/2017,6:15 AM

## 2017-12-22 NOTE — Progress Notes (Addendum)
PROGRESS NOTE    Katie MorinMary Agnes Logiudice  WUJ:[id number] DOB: [birth year] DOA: 12/18/2017 PCP: Macy MisBriscoe, Kim K, MD  Brief Narrative:84 y.o.femalewitha history of CAD s/p CABG x3 2008, partial colectomy for invasive polyp and take back for postop infection remotely, HTN, hypothyroidism, HLD, and COPD who presented to the ED after she awoke with leg cramping and noted significant nausea, vomiting and generalized abdominal pain since last evening. This has happened sporadically for a few weeks, but was more severe than previously and associated with loose stools and abdominal bloating. She was hypertensive and afebrile in the ED with continued vomiting, AKI, leukocytosis and ketonuria. CT showed evidence of small bowel dysmotility, but no obstruction. It also noted diffuse aortic atherosclerosis and severe infrarenal luminal narrowing. Admission was requested for dehydration, AKI due to intractable nausea and vomiting due to presumed viral gastroenteritis.  Review of Systems:She denies any suspicious ingestion, fever, sick contacts, hematemesis, hematochezia, melena. She had been eating less that day as well. She has had the leg cramping that awakens her at night for months without any directed treatment, denying claudication,and per HPI. All others reviewed and are negative.  12/22/2017 overnight patient had episodes of chest pain which was relieved after giving medications to decrease her blood pressure.  Her blood pressure was elevated in spite of multiple dose of hydralazine.  She also reports that after the hydralazine last 2 nights she had chest pain.  The first night she had chest pain she did not tell anyone.  Notes from overnight events noted.  She has an elevated troponin. Assessment & Plan:   Principal Problem:   Viral gastroenteritis Active Problems:   AKI (acute kidney injury) (HCC)   Aortic atherosclerosis (HCC)   CAD (coronary artery disease)   HTN (hypertension)   Hypothyroidism   UTI  (urinary tract infection)   Generalized abdominal pain   Nausea and vomiting   Generalized abdominal tenderness   Chest pain  1] CAD history of CABG with chest pain overnight with elevated troponin secondary to uncontrolled blood pressure. Intractable nausea, vomiting, diarrhea, abdominal pain: CT with suggestion of dysmotility, no obstruction. Will treat empirically for gastroenteritis. Note pt was seen by GI for postprandial giarrhea in Jan, started on fiber supplement, no further work up planned. - IVF's, start clear liquids  Can advance diet to clears when patient is ready/symptoms better controlled. - IV antiemetics, analgesics -kub shows improving partial sbo. -ambulate/oob  AKI: SCr 1.38due to dehydration.  - IVF and recheck BMP - Monitor UOP.   Aortic atherosclerosis with infrarenal luminal narrowing: With h/o CAD, suspect PAD with poor LE perfusion, though denies claudication symptoms. - ABI/TBI shows moderate bilateral lower extremity arterial disease.  Will consult vascular surgery.DW DR Randie HeinzAIN who will follow her as an outpatient.  CAD: s/p CABG x3 in 2008, [facility] at [facility]. No chest pain, dyspnea.  - Continue home medications ASA, statin  HTN: Currently on multiple medications which was started today and overnight because of high blood pressure.  Continue Avapro, Bidil, Cardizem and Catapres patch.  -Asthma:  - Continue singulair, no inhalers at home, no wheezing currently  Hypothyroidism: Chronic, stable - Continue synthroid     DVT prophylaxis: Heparin Code Status: Full code Family Communication: Husband was in the room Disposition Plan:  TBD Consultants: Cardiology, GI, surgery  Procedures: None Antimicrobials: None Subjective: Reports chest pain has resolved.  Objective: Reproducible left-sided chest pain Vitals:   12/21/17 2354 12/22/17 0316 12/22/17 0429 12/22/17 1334  BP: (!) 193/59 Marland Kitchen(!)  133/48 (!) 153/51 (!) 162/42  Pulse:   78  73  Resp:   18 20  Temp:   (!) 97.3 F (36.3 C) 97.7 F (36.5 C)  TempSrc:   Oral Oral  SpO2:   94% 96%  Weight:      Height:        Intake/Output Summary (Last 24 hours) at 12/22/2017 1442 Last data filed at 12/22/2017 4098 Gross per 24 hour  Intake 3146.88 ml  Output -  Net 3146.88 ml   Filed Weights   12/18/17 0651  Weight: 63.1 kg (139 lb 1.8 oz)    Examination:  General exam: Appears calm and comfortable  Respiratory system: Clear to auscultation. Respiratory effort normal. Cardiovascular system: S1 & S2 heard, RRR. No JVD, murmurs, rubs, gallops or clicks. No pedal edema. Gastrointestinal system: Abdomen is nondistended, soft and nontender. No organomegaly or masses felt. Normal bowel sounds heard. Central nervous system: Alert and oriented. No focal neurological deficits. Extremities: Symmetric 5 x 5 power. Skin: No rashes, lesions or ulcers Psychiatry: Judgement and insight appear normal. Mood & affect appropriate.     Data Reviewed: I have personally reviewed following labs and imaging studies  CBC: Recent Labs  Lab 12/18/17 0134 12/19/17 0541 12/20/17 1134 12/21/17 0600 12/22/17 0408  WBC 14.0* 11.4* 7.2 6.0 6.4  HGB 15.1* 13.5 12.7 11.6* 10.8*  HCT 43.5 39.9 38.0 33.8* 31.7*  MCV 89.5 90.7 90.5 90.4 90.1  PLT 271 267 252 228 207   Basic Metabolic Panel: Recent Labs  Lab 12/18/17 0134 12/19/17 0541 12/20/17 1134 12/21/17 0600 12/22/17 0408  NA 135 133* 137 139 135  K 4.2 4.2 4.5 3.9 3.6  CL 99* 102 105 108 110  CO2 25 21* 21* 16* 16*  GLUCOSE 144* 97 82 74 71  BUN 31* 18 15 15 15   CREATININE 2.10* 1.45* 1.37* 1.38* 1.32*  CALCIUM 10.6* 9.3 9.3 9.0 8.4*   GFR: Estimated Creatinine Clearance: 25.1 mL/min (A) (by C-G formula based on SCr of 1.32 mg/dL (H)). Liver Function Tests: Recent Labs  Lab 12/18/17 0134 12/20/17 1134 12/22/17 0408  AST 30 26 29   ALT 20 16 17   ALKPHOS 53 43 32*  BILITOT 0.5 0.9 0.6  PROT 8.6* 7.3 5.9*    ALBUMIN 4.5 3.6 2.8*   Recent Labs  Lab 12/18/17 0134  LIPASE 47   No results for input(s): AMMONIA in the last 168 hours. Coagulation Profile: No results for input(s): INR, PROTIME in the last 168 hours. Cardiac Enzymes: Recent Labs  Lab 12/21/17 2224 12/22/17 0408 12/22/17 0955  TROPONINI 0.06* 0.14* 0.11*   BNP (last 3 results) No results for input(s): PROBNP in the last 8760 hours. HbA1C: No results for input(s): HGBA1C in the last 72 hours. CBG: Recent Labs  Lab 12/18/17 2210  GLUCAP 99   Lipid Profile: No results for input(s): CHOL, HDL, LDLCALC, TRIG, CHOLHDL, LDLDIRECT in the last 72 hours. Thyroid Function Tests: No results for input(s): TSH, T4TOTAL, FREET4, T3FREE, THYROIDAB in the last 72 hours. Anemia Panel: No results for input(s): VITAMINB12, FOLATE, FERRITIN, TIBC, IRON, RETICCTPCT in the last 72 hours. Sepsis Labs: Recent Labs  Lab 12/20/17 1134  LATICACIDVEN 1.0    Recent Results (from the past 240 hour(s))  Gastrointestinal Panel by PCR , Stool     Status: None   Collection Time: 12/20/17  4:35 PM  Result Value Ref Range Status   Campylobacter species NOT DETECTED NOT DETECTED Final   Plesimonas shigelloides NOT DETECTED  NOT DETECTED Final   Salmonella species NOT DETECTED NOT DETECTED Final   Yersinia enterocolitica NOT DETECTED NOT DETECTED Final   Vibrio species NOT DETECTED NOT DETECTED Final   Vibrio cholerae NOT DETECTED NOT DETECTED Final   Enteroaggregative E coli (EAEC) NOT DETECTED NOT DETECTED Final   Enteropathogenic E coli (EPEC) NOT DETECTED NOT DETECTED Final   Enterotoxigenic E coli (ETEC) NOT DETECTED NOT DETECTED Final   Shiga like toxin producing E coli (STEC) NOT DETECTED NOT DETECTED Final   Shigella/Enteroinvasive E coli (EIEC) NOT DETECTED NOT DETECTED Final   Cryptosporidium NOT DETECTED NOT DETECTED Final   Cyclospora cayetanensis NOT DETECTED NOT DETECTED Final   Entamoeba histolytica NOT DETECTED NOT DETECTED  Final   Giardia lamblia NOT DETECTED NOT DETECTED Final   Adenovirus F40/41 NOT DETECTED NOT DETECTED Final   Astrovirus NOT DETECTED NOT DETECTED Final   Norovirus GI/GII NOT DETECTED NOT DETECTED Final   Rotavirus A NOT DETECTED NOT DETECTED Final   Sapovirus (I, II, IV, and V) NOT DETECTED NOT DETECTED Final    Comment: Performed at [facility], [address] [place], [place] [address]         Radiology Studies: Dg Abd 1 View  Result Date: 12/22/2017 CLINICAL DATA:  Follow-up small bowel obstruction.  Abdominal pain. EXAM: ABDOMEN - 1 VIEW COMPARISON:  12/21/2017 FINDINGS: Multiple mildly dilated small bowel loops show no significant change. There is a relative paucity of colonic gas. These findings are consistent with mid to distal small bowel obstruction, and without significant change. IMPRESSION: Mid to distal small bowel obstruction, without significant change. Electronically Signed   By: Myles Rosenthal M.D.   On: 12/22/2017 07:21   Dg Abd 2 Views  Result Date: 12/21/2017 CLINICAL DATA:  Abdominal pain with nausea, vomiting, and diarrhea. Previous right hemicolectomy. EXAM: ABDOMEN - 2 VIEW COMPARISON:  CT scans dated 12/18/2017 and 12/20/2017 FINDINGS: There are several distended small bowel loops with air-fluid levels on the upright radiograph including within the nondistended left side of the colon. No free air. Extensive vascular calcifications.  No acute bone abnormality. IMPRESSION: The number of distended small bowel loops has diminished since the prior CT scan. Finding is consistent with improving partial small bowel obstruction. Electronically Signed   By: Francene Boyers M.D.   On: 12/21/2017 10:15        Scheduled Meds: . aspirin  81 mg Oral Daily  . atorvastatin  80 mg Oral q1800  . cloNIDine  0.2 mg Transdermal Weekly  . diltiazem  240 mg Oral Daily  . irbesartan  300 mg Oral Daily   And  . hydrochlorothiazide  25 mg Oral Daily  .  isosorbide-hydrALAZINE  1 tablet Oral TID  . levothyroxine  50 mcg Oral QAC breakfast   Continuous Infusions: . sodium chloride 75 mL/hr at 12/21/17 2325  . heparin 750 Units/hr (12/22/17 0628)     LOS: 3 days     Alwyn Ren, MD [facility]  If 7PM-7AM, please contact night-coverage [email or web address] Password [facility] 12/22/2017, 2:42 PM

## 2017-12-22 NOTE — Progress Notes (Deleted)
Mild troponin elevation likely due to uncontrolled hypertension. Recommend heparin, aspirin, lipitor, and resuming home BP meds. Her Cr has improved and there is no contraindication for ARB,aldosterone antagonist. Full note to follow.   Elder NegusManish J Cailee Blanke, MD [facility] Pager: [phone] Office: [phone] If no answer Cell [phone]

## 2017-12-22 NOTE — Progress Notes (Signed)
    [facility] paged for new consult overnight for chest pain with elevated troponin and hx of CABG. Upon arrival to pt room, Dr. Rosemary HolmsPatwardhan with [facility] was completing his examine. After discussion, it was determined that both [facility] and [facility] were paged for the consult. The pt is followed with Dr. Leeann Mustenaldo with [facility] [place]. Dr. Rosemary HolmsPatwardhan will be following for her hospital stay.  Thank You  Georgie ChardJill McDaniel NP-C [facility] Pager: [phone]

## 2017-12-22 NOTE — Progress Notes (Addendum)
Shift event note:  Notified by RN at approx 2220 regarding pt c/o CP 9/10. There does not appear to be any associated symptoms. 12-lead EKG reveals ST w/ 1st degree AVB w/ rate of 107 and non-specific ST and T-wave abnormality but w/o ischemic changes. Record reviewed. There is no previous EKG on record. Unable to get to bedside immediately so RR RN paged and responded to bedside. Pt's pain improving at that time w/ IV Fentanyl and a stat troponin was added. At the time of  bedside assessment by this NP pt noted resting quietly w/ eyes closed in NAD. When awakened pt was ask about her pain and she states it is better but still a 6/10. BP has remained elevated all day but SBP > 200 for several hours despite multiple doses of PRN Hydralazine. Remaining VSS. Pt relates her pain is better but she is still "breathing heavy" though no objective s/s SOB noted. RR-20 w/ 02 sats of 95% on r/a. BBS CTA. Heart sounds w/ RRR and w/o M/G/R. Pt relates her CP started like a pressure that went to her (R) arm and then traveled to her (L) arm. She admits to some SOB and nausea. When pt ask if she had ever had this pain before she stated that sometimes her leg cramps travel up into her chest and back. Pt reported to RN earlier that she had a similar episode last night but was not this bad, however pt tells me the episode last night was just as bad. This NP ask if she reported this to her nurse and she stated yes (though there was no report to this NP or documented in the EMR about  any c/o CP the night of 12/20/2017. (?).  Assessment/Plan:  1. Chest pain: Concerning in setting of pt w/ significant h/o CAD (s/p CABg x 3 in 2008) and persistent HTN since admission. Pt is not a reliable historian given different answers to RN and this NP. Follow troponin. Will give Labetalol IV q 10 min x 3 in attempt to lower BP. Will consider extra dose of clonidine. If troponin + will continue to cycle. Discussed pt w/ Dr Onalee Huaavid who is in agreement  w/ plan and has also reviewed EKG. Will continue to monitor closely.   Leanne ChangKatherine P. Malayah Demuro, NP-C [facility] Pager [phone]  CRITICAL CARE Performed by: Leanne ChangSCHORR, Day Greb P   Total critical care time: 90 minutes  Critical care time was exclusive of separately billable procedures and treating other patients.  Critical care was necessary to treat or prevent imminent or life-threatening deterioration.  Critical care was time spent personally by me on the following activities: development of treatment plan with patient and/or surrogate as well as nursing, discussions with consultants, evaluation of patient's response to treatment, examination of patient, obtaining history from patient or surrogate, ordering and performing treatments and interventions, ordering and review of laboratory studies, ordering and review of radiographic studies, pulse oximetry and re-evaluation of patient's condition.  0030: Addendum: Notified troponin 0.06. Will continue to cycle.  0530: Second trop 0.14. Cards consulted. To see this am. Hep drip per Rx.

## 2017-12-22 NOTE — Consult Note (Signed)
Hospital Consult    Reason for Consult:  Leg pain Referring Physician:  Jerolyn Center MRN #:  [id number]  History of Present Illness: This is a 84 y.o. female with history or cabg admitted with abdominal pain and complained of leg pain for which ABI's were obtained.  She is actually now not complaining of lower extremity pain continues to have abdominal discomfort.  She did have a CT angiogram of her abdomen and pelvis to evaluate for mesenteric ischemia in her intestines.  She does not have rest pain or tissue loss of her legs.  She states that sometimes her left leg particularly hurts with walking.  She is a former smoker and has hypertension as risk factors.  She diabetes.  She takes aspirin no blood thinners.  Past Medical History:  Diagnosis Date  . CHF (congestive heart failure) (HCC)   . Coronary artery disease   . Hypertension   . Hypothyroidism     Past Surgical History:  Procedure Laterality Date  . cadiac stents    . CARDIAC CATHETERIZATION    . COLON SURGERY    . CORONARY ARTERY BYPASS GRAFT  2009    Allergies  Allergen Reactions  . Clams [Shellfish Allergy] Anaphylaxis  . Other Other (See Comments)    Red Dye "Bright Color Medicines" Constipation swelling  . Red Dye Other (See Comments)    Constipation     Prior to Admission medications   Medication Sig Start Date End Date Taking? Authorizing Provider  aspirin 81 MG chewable tablet Chew 81 mg by mouth daily.   Yes [provider]  Cholecalciferol (VITAMIN D-3) 1000 units CAPS Take 1,000 Units by mouth daily.   Yes [provider]  diltiazem (CARDIZEM CD) 240 MG 24 hr capsule Take 240 mg by mouth daily. 09/25/17  Yes [provider]  irbesartan-hydrochlorothiazide (AVALIDE) 150-12.5 MG tablet Take 2 tablets by mouth daily. 11/22/17  Yes [provider]  levothyroxine (SYNTHROID, LEVOTHROID) 50 MCG tablet Take 50 mcg by mouth daily. 12/03/17  Yes [provider]    montelukast (SINGULAIR) 5 MG chewable tablet Chew 5 mg by mouth at bedtime. 10/21/17  Yes [provider]  Multiple Vitamins-Minerals (ALIVE WOMENS 50+) TABS Take 1 tablet by mouth daily.   Yes [provider]  pravastatin (PRAVACHOL) 40 MG tablet Take 40 mg by mouth daily. 11/18/17  Yes [provider]  spironolactone (ALDACTONE) 25 MG tablet Take 25 mg by mouth daily. 11/18/17  Yes [provider]  vitamin B-12 (CYANOCOBALAMIN) 100 MCG tablet Take 1,000 mcg by mouth daily.   Yes [provider]    Social History   Socioeconomic History  . Marital status: Married    Spouse name: Not on file  . Number of children: Not on file  . Years of education: Not on file  . Highest education level: Not on file  Social Needs  . Financial resource strain: Not on file  . Food insecurity - worry: Not on file  . Food insecurity - inability: Not on file  . Transportation needs - medical: Not on file  . Transportation needs - non-medical: Not on file  Occupational History  . Not on file  Tobacco Use  . Smoking status: Former Games developer  . Smokeless tobacco: Never Used  . Tobacco comment: quit smoking over 30 yrs ago   Substance and Sexual Activity  . Alcohol use: No    Frequency: Never  . Drug use: No  . Sexual activity: No  Other  Topics Concern  . Not on file  Social History Narrative  . Not on file     Family History  Problem Relation Age of Onset  . Hypertension Brother     REVIEW OF SYSTEMS (negative unless checked):   Cardiac:  []  Chest pain or chest pressure? [x]  Shortness of breath upon activity? []  Shortness of breath when lying flat? []  Irregular heart rhythm?  Vascular:  [x]  Pain in calf, thigh, or hip brought on by walking? []  Pain in feet at night that wakes you up from your sleep? []  Blood clot in your veins? []  Leg swelling?  Pulmonary:  []  Oxygen at home? []  Productive cough? []  Wheezing?  Neurologic:  []  Sudden  weakness in arms or legs? []  Sudden numbness in arms or legs? []  Sudden onset of difficult speaking or slurred speech? []  Temporary loss of vision in one eye? []  Problems with dizziness?  Gastrointestinal:  [x]  Abdominal pain []  Vomited blood?  Genitourinary:  []  Burning when urinating? []  Blood in urine?  Psychiatric:  []  Major depression  Hematologic:  []  Bleeding problems? []  Problems with blood clotting?  Dermatologic:  []  Rashes or ulcers?  Constitutional:  []  Fever or chills?  Ear/Nose/Throat:  []  Change in hearing? []  Nose bleeds? []  Sore throat?  Musculoskeletal:  []  Back pain? []  Joint pain? []  Muscle pain?   Physical Examination  Vitals:   12/22/17 0429 12/22/17 1334  BP: (!) 153/51 (!) 162/42  Pulse: 78 73  Resp: 18 20  Temp: (!) 97.3 F (36.3 C) 97.7 F (36.5 C)  SpO2: 94% 96%   Body mass index is 30.1 kg/m.  General:  WDWN in NAD HENT: WNL, normocephalic Pulmonary: normal non-labored breathing Cardiac: bilateral femoral pulses, no palpable popliteal or pedal pulses Abdomen: distended, soft, tympanic Extremities: no tissue loss or ulceration  Neurologic: A&O X 3;  SENSATION: normal; MOTOR FUNCTION:  moving all extremities equally. Speech is fluent/normal Psychiatric:  Appropriate mood and affect.    CBC    Component Value Date/Time   WBC 6.4 12/22/2017 0408   RBC 3.52 (L) 12/22/2017 0408   HGB 10.8 (L) 12/22/2017 0408   HCT 31.7 (L) 12/22/2017 0408   PLT 207 12/22/2017 0408   MCV 90.1 12/22/2017 0408   MCH 30.7 12/22/2017 0408   MCHC 34.1 12/22/2017 0408   RDW 13.5 12/22/2017 0408    BMET    Component Value Date/Time   NA 135 12/22/2017 0408   K 3.6 12/22/2017 0408   CL 110 12/22/2017 0408   CO2 16 (L) 12/22/2017 0408   GLUCOSE 71 12/22/2017 0408   BUN 15 12/22/2017 0408   CREATININE 1.32 (H) 12/22/2017 0408   CALCIUM 8.4 (L) 12/22/2017 0408   GFRNONAA 36 (L) 12/22/2017 0408   GFRAA 42 (L) 12/22/2017 0408     COAGS: No results found for: INR, PROTIME   Non-Invasive Vascular Imaging:      ASSESSMENT/PLAN: This is a 84 y.o. female with abdominal pain and distention being evaluated by general surgery as well as chest pain that has been evaluated by cardiology.  During her initial evaluation she also complained of left leg pain although that has subsided.  She does have severely depressed ABI on the left relative to the right and the previous ABI study appears to be misread.  From the standpoint she would probably benefit from angiogram given that it appears that the proximal aspect at least of her SFA on the left is occluded from her recent CTA.  Given her other issues at this time I think able the idea of angiogram particularly since she is not complaining of leg pain.  Most likely will have her follow-up as an outpatient unless she has further complaints during this hospitalization.  I will touch base with primary team tomorrow.   Coralynn Gaona C. Randie Heinzain, MD [facility] Office: [phone] Pager: [phone]

## 2017-12-22 NOTE — Progress Notes (Signed)
PROGRESS NOTE    Katie [name]  ZOX:[id number] DOB: 03/05/1933 DOA: 12/18/2017 PCP: Macy Mis, MD  Brief Narrative:84 y.o.femalewitha history of CAD s/p CABG x3 2008, partial colectomy for invasive polyp and take back for postop infection remotely, HTN, hypothyroidism, HLD, and COPD who presented to the ED after she awoke with leg cramping and noted significant nausea, vomiting and generalized abdominal pain since last evening. This has happened sporadically for a few weeks, but was more severe than previously and associated with loose stools and abdominal bloating. She was hypertensive and afebrile in the ED with continued vomiting, AKI, leukocytosis and ketonuria. CT showed evidence of small bowel dysmotility, but no obstruction. It also noted diffuse aortic atherosclerosis and severe infrarenal luminal narrowing. Admission was requested for dehydration, AKI due to intractable nausea and vomiting due to presumed viral gastroenteritis.  Review of Systems:She denies any suspicious ingestion, fever, sick contacts, hematemesis, hematochezia, melena. She had been eating less that day as well. She has had the leg cramping that awakens her at night for months without any directed treatment, denying claudication,and per HPI. All others reviewed and are negative.  12/22/2017 overnight patient had episodes of chest pain which was relieved after giving medications to decrease her blood pressure.  Her blood pressure was elevated in spite of multiple dose of hydralazine.  She also reports that after the hydralazine last 2 nights she had chest pain.  The first night she had chest pain she did not tell anyone.  Notes from overnight events noted.  She has an elevated troponin. Assessment & Plan:   Principal Problem:   Viral gastroenteritis Active Problems:   AKI (acute kidney injury) (HCC)   Aortic atherosclerosis (HCC)   CAD (coronary artery disease)   HTN (hypertension)   Hypothyroidism   UTI  (urinary tract infection)   Generalized abdominal pain   Nausea and vomiting   Generalized abdominal tenderness   Chest pain  1] CAD history of CABG with chest pain overnight with elevated troponin secondary to uncontrolled blood pressure. Intractable nausea, vomiting, diarrhea, abdominal pain: CT with suggestion of dysmotility, no obstruction. Will treat empirically for gastroenteritis. Note pt was seen by GI for postprandial giarrhea in Jan, started on fiber supplement, no further work up planned. - IVF's, start clear liquids  Can advance diet to clears when patient is ready/symptoms better controlled. - IV antiemetics, analgesics -kub shows improving partial sbo. -ambulate/oob  AKI: SCr 1.38due to dehydration.  - IVF and recheck BMP - Monitor UOP.   Aortic atherosclerosis with infrarenal luminal narrowing: With h/o CAD, suspect PAD with poor LE perfusion, though denies claudication symptoms. - ABI/TBI shows moderate bilateral lower extremity arterial disease.  Will consult vascular surgery.  CAD: s/p CABG x3 in 2008, followed by cardiology at [facility]. No chest pain, dyspnea.  - Continue home medications ASA, statin  HTN: Currently on multiple medications which was started today and overnight because of high blood pressure.  Continue Avapro, Bidil, Cardizem and Catapres patch.  -Asthma:  - Continue singulair, no inhalers at home, no wheezing currently  Hypothyroidism: Chronic, stable - Continue synthroid     DVT prophylaxis: Heparin Code Status: Full code Family Communication: Husband was in the room Disposition Plan:  TBD Consultants: Cardiology, GI, surgery  Procedures: None Antimicrobials: None Subjective: Reports chest pain has resolved.  Objective: Reproducible left-sided chest pain Vitals:   12/21/17 2354 12/22/17 0316 12/22/17 0429 12/22/17 1334  BP: (!) 193/59 (!) 133/48 (!) 153/51 (!) 162/42  Pulse:  78 73  Resp:   18 20  Temp:   (!) 97.3 F (36.3  C) 97.7 F (36.5 C)  TempSrc:   Oral Oral  SpO2:   94% 96%  Weight:      Height:        Intake/Output Summary (Last 24 hours) at 12/22/2017 1442 Last data filed at 12/22/2017 [id number] Gross per 24 hour  Intake 3146.88 ml  Output -  Net 3146.88 ml   Filed Weights   12/18/17 0651  Weight: 63.1 kg (139 lb 1.8 oz)    Examination:  General exam: Appears calm and comfortable  Respiratory system: Clear to auscultation. Respiratory effort normal. Cardiovascular system: S1 & S2 heard, RRR. No JVD, murmurs, rubs, gallops or clicks. No pedal edema. Gastrointestinal system: Abdomen is nondistended, soft and nontender. No organomegaly or masses felt. Normal bowel sounds heard. Central nervous system: Alert and oriented. No focal neurological deficits. Extremities: Symmetric 5 x 5 power. Skin: No rashes, lesions or ulcers Psychiatry: Judgement and insight appear normal. Mood & affect appropriate.     Data Reviewed: I have personally reviewed following labs and imaging studies  CBC: Recent Labs  Lab 12/18/17 0134 12/19/17 0541 12/20/17 1134 12/21/17 0600 12/22/17 0408  WBC 14.0* 11.4* 7.2 6.0 6.4  HGB 15.1* 13.5 12.7 11.6* 10.8*  HCT 43.5 39.9 38.0 33.8* 31.7*  MCV 89.5 90.7 90.5 90.4 90.1  PLT 271 267 252 228 207   Basic Metabolic Panel: Recent Labs  Lab 12/18/17 0134 12/19/17 0541 12/20/17 1134 12/21/17 0600 12/22/17 0408  NA 135 133* 137 139 135  K 4.2 4.2 4.5 3.9 3.6  CL 99* 102 105 108 110  CO2 25 21* 21* 16* 16*  GLUCOSE 144* 97 82 74 71  BUN 31* 18 15 15 15   CREATININE 2.10* 1.45* 1.37* 1.38* 1.32*  CALCIUM 10.6* 9.3 9.3 9.0 8.4*   GFR: Estimated Creatinine Clearance: 25.1 mL/min (A) (by C-G formula based on SCr of 1.32 mg/dL (H)). Liver Function Tests: Recent Labs  Lab 12/18/17 0134 12/20/17 1134 12/22/17 0408  AST 30 26 29   ALT 20 16 17   ALKPHOS 53 43 32*  BILITOT 0.5 0.9 0.6  PROT 8.6* 7.3 5.9*  ALBUMIN 4.5 3.6 2.8*   Recent Labs  Lab  12/18/17 0134  LIPASE 47   No results for input(s): AMMONIA in the last 168 hours. Coagulation Profile: No results for input(s): INR, PROTIME in the last 168 hours. Cardiac Enzymes: Recent Labs  Lab 12/21/17 2224 12/22/17 0408 12/22/17 0955  TROPONINI 0.06* 0.14* 0.11*   BNP (last 3 results) No results for input(s): PROBNP in the last 8760 hours. HbA1C: No results for input(s): HGBA1C in the last 72 hours. CBG: Recent Labs  Lab 12/18/17 2210  GLUCAP 99   Lipid Profile: No results for input(s): CHOL, HDL, LDLCALC, TRIG, CHOLHDL, LDLDIRECT in the last 72 hours. Thyroid Function Tests: No results for input(s): TSH, T4TOTAL, FREET4, T3FREE, THYROIDAB in the last 72 hours. Anemia Panel: No results for input(s): VITAMINB12, FOLATE, FERRITIN, TIBC, IRON, RETICCTPCT in the last 72 hours. Sepsis Labs: Recent Labs  Lab 12/20/17 1134  LATICACIDVEN 1.0    Recent Results (from the past 240 hour(s))  Gastrointestinal Panel by PCR , Stool     Status: None   Collection Time: 12/20/17  4:35 PM  Result Value Ref Range Status   Campylobacter species NOT DETECTED NOT DETECTED Final   Plesimonas shigelloides NOT DETECTED NOT DETECTED Final   Salmonella species NOT DETECTED NOT  DETECTED Final   Yersinia enterocolitica NOT DETECTED NOT DETECTED Final   Vibrio species NOT DETECTED NOT DETECTED Final   Vibrio cholerae NOT DETECTED NOT DETECTED Final   Enteroaggregative E coli (EAEC) NOT DETECTED NOT DETECTED Final   Enteropathogenic E coli (EPEC) NOT DETECTED NOT DETECTED Final   Enterotoxigenic E coli (ETEC) NOT DETECTED NOT DETECTED Final   Shiga like toxin producing E coli (STEC) NOT DETECTED NOT DETECTED Final   Shigella/Enteroinvasive E coli (EIEC) NOT DETECTED NOT DETECTED Final   Cryptosporidium NOT DETECTED NOT DETECTED Final   Cyclospora cayetanensis NOT DETECTED NOT DETECTED Final   Entamoeba histolytica NOT DETECTED NOT DETECTED Final   Giardia lamblia NOT DETECTED NOT  DETECTED Final   Adenovirus F40/41 NOT DETECTED NOT DETECTED Final   Astrovirus NOT DETECTED NOT DETECTED Final   Norovirus GI/GII NOT DETECTED NOT DETECTED Final   Rotavirus A NOT DETECTED NOT DETECTED Final   Sapovirus (I, II, IV, and V) NOT DETECTED NOT DETECTED Final    Comment: Performed at [facility], [address] [place], [place] [address]         Radiology Studies: Dg Abd 1 View  Result Date: 12/22/2017 CLINICAL DATA:  Follow-up small bowel obstruction.  Abdominal pain. EXAM: ABDOMEN - 1 VIEW COMPARISON:  12/21/2017 FINDINGS: Multiple mildly dilated small bowel loops show no significant change. There is a relative paucity of colonic gas. These findings are consistent with mid to distal small bowel obstruction, and without significant change. IMPRESSION: Mid to distal small bowel obstruction, without significant change. Electronically Signed   By: Myles Rosenthal M.D.   On: 12/22/2017 07:21   Dg Abd 2 Views  Result Date: 12/21/2017 CLINICAL DATA:  Abdominal pain with nausea, vomiting, and diarrhea. Previous right hemicolectomy. EXAM: ABDOMEN - 2 VIEW COMPARISON:  CT scans dated 12/18/2017 and 12/20/2017 FINDINGS: There are several distended small bowel loops with air-fluid levels on the upright radiograph including within the nondistended left side of the colon. No free air. Extensive vascular calcifications.  No acute bone abnormality. IMPRESSION: The number of distended small bowel loops has diminished since the prior CT scan. Finding is consistent with improving partial small bowel obstruction. Electronically Signed   By: Francene Boyers M.D.   On: 12/21/2017 10:15        Scheduled Meds: . aspirin  81 mg Oral Daily  . atorvastatin  80 mg Oral q1800  . cloNIDine  0.2 mg Transdermal Weekly  . diltiazem  240 mg Oral Daily  . irbesartan  300 mg Oral Daily   And  . hydrochlorothiazide  25 mg Oral Daily  . isosorbide-hydrALAZINE  1 tablet Oral TID  . levothyroxine   50 mcg Oral QAC breakfast   Continuous Infusions: . sodium chloride 75 mL/hr at 12/21/17 2325  . heparin 750 Units/hr (12/22/17 0628)     LOS: 3 days     Alwyn Ren, MD [facility]  If 7PM-7AM, please contact night-coverage [email or web address] Password [facility] 12/22/2017, 2:42 PM

## 2017-12-22 NOTE — Progress Notes (Signed)
Patient c/o sudden chest pain after going to the bathroom with this RN. Pt rates pain 6/10. Patient also says she was about to tell me that her left arm continues to be sore, and points to her interior upper arm. Vitals WNL. MD notified. Patient states that chest pain has eased off. Will continue to monitor.

## 2017-12-22 NOTE — Consult Note (Addendum)
Reason for Consult: Troponin elevation Referring Physician: [facility] hospitalist  Katie [name] is an 84 y.o. female.  HPI:   84 year old female with coronary artery disease status post CABG 3 in 2000, partial colectomy for invasive polyp around 2010, hypertension, hypothyroidism, admitted to the hospital on 12/18/2017 with nausea, vomiting, and abdominal pain. She is currently being worked up for possible chronic mesenteric ischemia. Cardiology was consulted regarding troponin elevation.  Patient has had uncontrolled blood pressure since her hospital admission.  It appears that her home blood pressure medications were held due to acute kidney injury.  Creatinine has improved.  She had an episode of retrosternal chest pressure last night radiating to her arm.  She did not have any acute ischemic changes on EKG.  Troponin is mildly elevated at 0.14 and now falling.  At baseline, patient lives fairly sedentary lifestyle.  With her limited functional capacity, she does not have any significant chest pain or shortness of breath. She does endorse bilateral claudication. She does have early satiety and postprandial abdominal tightness sensation. While in hospital, she has not had any melena. Workup so far including CT angiogram of abdomen, in addition to showing partial SBO, did show significant narrowing at the origin of celiac access and IMA, but no significant narrowing involving the SMA. There is plaque involving the SMA, which is scattered. Single left renal artery is patent with extensive atherosclerotic calcified and soft plaque in both renal arteries. There is extensive atherosclerotic calcification at the origin of the right renal artery. Significant narrowing cannot be excluded.  She underwent CABG X 3 in 2008 for left main disease with a LIMA graft to the LAD and sequential vein graft to the ramus and circumflex. She had normal LV function at that time. Her last stress echocardiogram on 11/15/2014  revealed poor exercise tolerance due to deconditioning and target heart rate achieved with no evidence of ischemia. She has an established cardiologist in [place], Dr. Lamar Blinks.     Past Medical History:  Diagnosis Date  . CHF (congestive heart failure) ([place])   . Coronary artery disease   . Hypertension   . Hypothyroidism     Past Surgical History:  Procedure Laterality Date  . cadiac stents    . CARDIAC CATHETERIZATION    . COLON SURGERY    . CORONARY ARTERY BYPASS GRAFT  2009    Family History  Problem Relation Age of Onset  . Hypertension Brother     Social History:  reports that she has quit smoking. she has never used smokeless tobacco. She reports that she does not drink alcohol or use drugs.  Allergies:  Allergies  Allergen Reactions  . Clams [Shellfish Allergy] Anaphylaxis  . Other Other (See Comments)    Red Dye "Bright Color Medicines" Constipation swelling  . Red Dye Other (See Comments)    Constipation     Medications: I have reviewed the patient's current medications.  Results for orders placed or performed during the hospital encounter of 12/18/17 (from the past 48 hour(s))  Comprehensive metabolic panel     Status: Abnormal   Collection Time: 12/20/17 11:34 AM  Result Value Ref Range   Sodium 137 135 - 145 mmol/L   Potassium 4.5 3.5 - 5.1 mmol/L   Chloride 105 101 - 111 mmol/L   CO2 21 (L) 22 - 32 mmol/L   Glucose, Bld 82 65 - 99 mg/dL   BUN 15 6 - 20 mg/dL   Creatinine, Ser 1.37 (H) 0.44 - 1.00  mg/dL   Calcium 9.3 8.9 - 10.3 mg/dL   Total Protein 7.3 6.5 - 8.1 g/dL   Albumin 3.6 3.5 - 5.0 g/dL   AST 26 15 - 41 U/L   ALT 16 14 - 54 U/L   Alkaline Phosphatase 43 38 - 126 U/L   Total Bilirubin 0.9 0.3 - 1.2 mg/dL   GFR calc non Af Amer 35 (L) >60 mL/min   GFR calc Af Amer 40 (L) >60 mL/min    Comment: (NOTE) The eGFR has been calculated using the CKD EPI equation. This calculation has not been validated in all clinical  situations. eGFR's persistently <60 mL/min signify possible Chronic Kidney Disease.    Anion gap 11 5 - 15    Comment: Performed at [facility], [place] [address] [place], [place] [address]  Lactic acid, plasma     Status: None   Collection Time: 12/20/17 11:34 AM  Result Value Ref Range   Lactic Acid, Venous 1.0 0.5 - 1.9 mmol/L    Comment: Performed at [facility], [place] [address] [place], [place] [address]  CBC     Status: None   Collection Time: 12/20/17 11:34 AM  Result Value Ref Range   WBC 7.2 4.0 - 10.5 K/uL   RBC 4.20 3.87 - 5.11 MIL/uL   Hemoglobin 12.7 12.0 - 15.0 g/dL   HCT 38.0 36.0 - 46.0 %   MCV 90.5 78.0 - 100.0 fL   MCH 30.2 26.0 - 34.0 pg   MCHC 33.4 30.0 - 36.0 g/dL   RDW 13.3 11.5 - 15.5 %   Platelets 252 150 - 400 K/uL    Comment: Performed at [facility], [place] [address] [place], [place] [address]  Gastrointestinal Panel by PCR , Stool     Status: None   Collection Time: 12/20/17  4:35 PM  Result Value Ref Range   Campylobacter species NOT DETECTED NOT DETECTED   Plesimonas shigelloides NOT DETECTED NOT DETECTED   Salmonella species NOT DETECTED NOT DETECTED   Yersinia enterocolitica NOT DETECTED NOT DETECTED   Vibrio species NOT DETECTED NOT DETECTED   Vibrio cholerae NOT DETECTED NOT DETECTED   Enteroaggregative E coli (EAEC) NOT DETECTED NOT DETECTED   Enteropathogenic E coli (EPEC) NOT DETECTED NOT DETECTED   Enterotoxigenic E coli (ETEC) NOT DETECTED NOT DETECTED   Shiga like toxin producing E coli (STEC) NOT DETECTED NOT DETECTED   Shigella/Enteroinvasive E coli (EIEC) NOT DETECTED NOT DETECTED   Cryptosporidium NOT DETECTED NOT DETECTED   Cyclospora cayetanensis NOT DETECTED NOT DETECTED   Entamoeba histolytica NOT DETECTED NOT DETECTED   Giardia lamblia NOT DETECTED NOT DETECTED   Adenovirus F40/41 NOT DETECTED NOT DETECTED   Astrovirus NOT DETECTED NOT DETECTED   Norovirus GI/GII NOT  DETECTED NOT DETECTED   Rotavirus A NOT DETECTED NOT DETECTED   Sapovirus (I, II, IV, and V) NOT DETECTED NOT DETECTED    Comment: Performed at [facility], [place] [place], [place] [address]  CBC     Status: Abnormal   Collection Time: 12/21/17  6:00 AM  Result Value Ref Range   WBC 6.0 4.0 - 10.5 K/uL   RBC 3.74 (L) 3.87 - 5.11 MIL/uL   Hemoglobin 11.6 (L) 12.0 - 15.0 g/dL   HCT 33.8 (L) 36.0 - 46.0 %   MCV 90.4 78.0 - 100.0 fL   MCH 31.0 26.0 - 34.0 pg   MCHC 34.3 30.0 - 36.0 g/dL  RDW 13.3 11.5 - 15.5 %   Platelets 228 150 - 400 K/uL    Comment: Performed at [facility], [place] [address] [place], [place] [address]  Basic metabolic panel     Status: Abnormal   Collection Time: 12/21/17  6:00 AM  Result Value Ref Range   Sodium 139 135 - 145 mmol/L   Potassium 3.9 3.5 - 5.1 mmol/L   Chloride 108 101 - 111 mmol/L   CO2 16 (L) 22 - 32 mmol/L   Glucose, Bld 74 65 - 99 mg/dL   BUN 15 6 - 20 mg/dL   Creatinine, Ser 1.38 (H) 0.44 - 1.00 mg/dL   Calcium 9.0 8.9 - 10.3 mg/dL   GFR calc non Af Amer 35 (L) >60 mL/min   GFR calc Af Amer 40 (L) >60 mL/min    Comment: (NOTE) The eGFR has been calculated using the CKD EPI equation. This calculation has not been validated in all clinical situations. eGFR's persistently <60 mL/min signify possible Chronic Kidney Disease.    Anion gap 15 5 - 15    Comment: Performed at [facility], [place] [address], [place], [place] [address]  Troponin I     Status: Abnormal   Collection Time: 12/21/17 10:24 PM  Result Value Ref Range   Troponin I 0.06 (HH) <0.03 ng/mL    Comment: CRITICAL RESULT CALLED TO, READ BACK BY AND VERIFIED WITH: Ronnell Freshwater [id number] @ [id number] BY J SCOTTON Performed at [place] [address] [place], [place] [address]   CBC     Status: Abnormal   Collection Time: 12/22/17  4:08 AM  Result Value Ref Range   WBC 6.4 4.0 - 10.5 K/uL   RBC  3.52 (L) 3.87 - 5.11 MIL/uL   Hemoglobin 10.8 (L) 12.0 - 15.0 g/dL   HCT 31.7 (L) 36.0 - 46.0 %   MCV 90.1 78.0 - 100.0 fL   MCH 30.7 26.0 - 34.0 pg   MCHC 34.1 30.0 - 36.0 g/dL   RDW 13.5 11.5 - 15.5 %   Platelets 207 150 - 400 K/uL    Comment: Performed at [facility], [place] [address], [place], [place] [address]  Comprehensive metabolic panel     Status: Abnormal   Collection Time: 12/22/17  4:08 AM  Result Value Ref Range   Sodium 135 135 - 145 mmol/L   Potassium 3.6 3.5 - 5.1 mmol/L   Chloride 110 101 - 111 mmol/L   CO2 16 (L) 22 - 32 mmol/L   Glucose, Bld 71 65 - 99 mg/dL   BUN 15 6 - 20 mg/dL   Creatinine, Ser 1.32 (H) 0.44 - 1.00 mg/dL   Calcium 8.4 (L) 8.9 - 10.3 mg/dL   Total Protein 5.9 (L) 6.5 - 8.1 g/dL   Albumin 2.8 (L) 3.5 - 5.0 g/dL   AST 29 15 - 41 U/L   ALT 17 14 - 54 U/L   Alkaline Phosphatase 32 (L) 38 - 126 U/L   Total Bilirubin 0.6 0.3 - 1.2 mg/dL   GFR calc non Af Amer 36 (L) >60 mL/min   GFR calc Af Amer 42 (L) >60 mL/min    Comment: (NOTE) The eGFR has been calculated using the CKD EPI equation. This calculation has not been validated in all clinical situations. eGFR's persistently <60 mL/min signify possible Chronic Kidney Disease.    Anion gap 9 5 - 15    Comment: Performed at [facility], [place] [address] [place], [place] [address]  Troponin I     Status: Abnormal   Collection Time: 12/22/17  4:08 AM  Result Value Ref Range   Troponin I 0.14 (HH) <0.03 ng/mL    Comment: CRITICAL VALUE NOTED.  VALUE IS CONSISTENT WITH PREVIOUSLY REPORTED AND CALLED VALUE. Performed at [facility], [place] [address] [place], [place] [address]     Dg Abd 1 View  Result Date: 12/22/2017 CLINICAL DATA:  Follow-up small bowel obstruction.  Abdominal pain. EXAM: ABDOMEN - 1 VIEW COMPARISON:  12/21/2017 FINDINGS: Multiple mildly dilated small bowel loops show no significant change. There is a relative paucity of  colonic gas. These findings are consistent with mid to distal small bowel obstruction, and without significant change. IMPRESSION: Mid to distal small bowel obstruction, without significant change. Electronically Signed   By: Earle Gell M.D.   On: 12/22/2017 07:21   Dg Abd 2 Views  Result Date: 12/21/2017 CLINICAL DATA:  Abdominal pain with nausea, vomiting, and diarrhea. Previous right hemicolectomy. EXAM: ABDOMEN - 2 VIEW COMPARISON:  CT scans dated 12/18/2017 and 12/20/2017 FINDINGS: There are several distended small bowel loops with air-fluid levels on the upright radiograph including within the nondistended left side of the colon. No free air. Extensive vascular calcifications.  No acute bone abnormality. IMPRESSION: The number of distended small bowel loops has diminished since the prior CT scan. Finding is consistent with improving partial small bowel obstruction. Electronically Signed   By: Lorriane Shire M.D.   On: 12/21/2017 10:15   Ct Angio Abd/pel W/ And/or W/o  Result Date: 12/20/2017 CLINICAL DATA:  Generalized abdominal pain for 3 weeks. Evaluate for ischemic bowel. EXAM: CTA ABDOMEN AND PELVIS wITHOUT AND WITH CONTRAST TECHNIQUE: Multidetector CT imaging of the abdomen and pelvis was performed using the standard protocol during bolus administration of intravenous contrast. Multiplanar reconstructed images and MIPs were obtained and reviewed to evaluate the vascular anatomy. CONTRAST:  17m ISOVUE-370 IOPAMIDOL (ISOVUE-370) INJECTION 76% COMPARISON:  12/18/2017 FINDINGS: VASCULAR Aorta: Diffuse atherosclerotic calcifications and plaque in the abdominal aorta without aneurysmal dilatation. Celiac: There is significant narrowing at the origin with atherosclerotic calcification. Accessory left hepatic artery anatomy. SMA: Diffuse atherosclerotic soft and calcified plaque. No definite significant narrowing. Renals: Single left renal artery is patent with extensive atherosclerotic calcified and  soft plaque in both renal arteries. There is extensive atherosclerotic calcification at the origin of the right renal artery. Significant narrowing cannot be excluded. IMA: The vessel is severely diminutive. There is significant disease at the origin. Inflow: There are diffuse atherosclerotic calcifications throughout the bilateral common and external iliac arteries.There is severe disease involving the proximal internal iliac arteries bilaterally. Proximal Outflow: Right common femoral artery is patent. The proximal superficial femoral artery on the right is severely diseased. The left common femoral artery is patent. The left superficial femoral artery is occluded. Veins: No obvious DVT. Review of the MIP images confirms the above findings. NON-VASCULAR Lower chest: There is dependent atelectasis at the right lung base. There is atelectasis versus airspace disease at the left lung base which has increased since the prior study. Hepatobiliary: Several liver cysts are stable. The left lobe of the liver is somewhat lobulated of unknown significance. Gallbladder is within normal limits. Pancreas: Unremarkable Spleen: There is a small calcified lesion at the upper anterior spleen of unknown significance. Adrenals/Urinary Tract: No evidence of renal mass or hydronephrosis. There is a cyst in the mid left kidney. Adrenal glands are  unremarkable. There is a noticeable diminished opacification of the right renal parenchyma on arterial phase suggesting arterial insufficiency and significant narrowing in the right renal artery. Bladder is decompressed. Stomach/Bowel: The stomach is decompressed. There are several small bowel loops which are moderately dilated with air-fluid levels. Distal small bowel loops in the mid abdomen adjacent to the anastomosis to transverse colon are decompressed. The colon is decompressed. The patient is status post right hemicolectomy. No obvious mass in the colon. Lymphatic: No abnormal  retroperitoneal adenopathy. Reproductive: Uterus and adnexa are within normal limits. Other: There is a small amount of free fluid layering in the pelvis, in the left paracolic gutter, and about the liver. Musculoskeletal: No vertebral compression deformity. Advanced degenerative disc disease in the lumbar spine. IMPRESSION: VASCULAR There is extensive atherosclerotic calcification and plaque in the aorta without aneurysmal dilatation. There may be significant narrowing at the origin of the celiac axis and IMA, but there is no significant narrowing involving the SMA. There is plaque involving the SMA which is scattered. There is no evidence of acute thrombosis or vessel occlusion in the mesenteric vasculature. Chronic mesenteric ischemia is a concern and is associated with weight loss and chronic postprandial pain. Significant narrowing at the origin of the right renal artery. NON-VASCULAR There are moderately dilated small bowel loops as well as decompressed distal small bowel loops suggesting an early or partial small bowel obstruction pattern. The patient is status post right hemicolectomy. Colon is decompressed. There is free fluid in the abdomen suggesting an inflammatory process. Increasing bibasilar atelectasis. More confluent pulmonary density at the left base may represent developing consolidation. Electronically Signed   By: Marybelle Killings M.D.   On: 12/20/2017 15:35   EKG 12/21/2017: Sinus rhythm. Normal axis. First degree AV block. Left ventricular hypertrophy. Left atrial enlargement. Inferolateral ST-T changes, likely due to LVH. Cannot exclude ischemia.  Bilateral lower extremity ABI 12/19/2017:  Right: Resting right ankle-brachial index indicates moderate right lower extremity arterial disease. The right toe-brachial index is abnormal. Left: Resting left ankle-brachial index indicates moderate left lower extremity arterial disease. The left toe-brachial index is abnormal.  Review of Systems   Constitutional: Negative.  Negative for weight loss.  HENT: Negative.   Respiratory: Negative for cough and shortness of breath.   Cardiovascular: Positive for chest pain and claudication. Negative for palpitations, orthopnea, leg swelling and PND.  Gastrointestinal: Positive for abdominal pain, nausea and vomiting. Negative for blood in stool.  Genitourinary: Negative.   Musculoskeletal:       Bilateral leg cramps at rest  Skin: Negative.   Neurological: Negative for dizziness and loss of consciousness.  Endo/Heme/Allergies: Does not bruise/bleed easily.  Psychiatric/Behavioral: The patient is not nervous/anxious.   All other systems reviewed and are negative.  Blood pressure (!) 153/51, pulse 78, temperature (!) 97.3 F (36.3 C), temperature source Oral, resp. rate 18, height _0  (1.448 m), weight 63.1 kg (139 lb 1.8 oz), SpO2 94 %. Physical Exam  Nursing note and vitals reviewed. Constitutional: She appears well-developed and well-nourished.  HENT:  Head: Normocephalic and atraumatic.  Eyes: Conjunctivae are normal. Pupils are equal, round, and reactive to light.  Neck: Normal range of motion. Neck supple. No JVD present.  Cardiovascular: Normal rate, regular rhythm and normal heart sounds.  No murmur heard. Pulses:      Carotid pulses are on the right side with bruit.      Femoral pulses are 2+ on the right side, and 1+ on the left side.  Dorsalis pedis pulses are 0 on the right side, and 0 on the left side.       Posterior tibial pulses are 0 on the right side, and 0 on the left side.  Respiratory: Effort normal and breath sounds normal. She has no wheezes. She has no rales.  Musculoskeletal: She exhibits no edema.    Assessment: 84 year old female with coronary artery disease status post CABG Troponin elevation: While the patient has underlying carotid artery disease, overnight event of chest pain and troponin elevation likely a type II MI event in the setting of  uncontrolled blood pressure. CAD s/p CABG 2008 Abdominal pain: Partial small bowel obstruction Possible chronic mesenteric ischemia H/o hemicolectomy for invasive polyp 2010 Peripheral artery disease involving bilateral lower extremities Possible renal artery stenosis Nausea, vomiting, abdominal pain: Improved Hypertension: Uncontrolled. Currently on clonidine 0.2 mg patch, on iv hydralazine prn. No other scheduled medications. Hypothyroidism  Recommendations: Recommend aspirin 81 mg.  Switch pravastatin 20 mg to atorvastatin 80 mg daily.  Agree with IV heparin.  Recommend echocardiogram.  If no recurrent chest pain, or ischemic EKG changes, I do not think she needs invasive management of her coronary artery disease at this time.   She does need aggressive management for her hypertension. Given concern for renal artery stenosis, we may need to hold either one of ACEi or ARB. Recommend Bidil instead of spironolactone. Will need to watch closely for acute kidney injury in light of possible renal artery stenosis.  She will likely need outpatient noninvasive or invasive workup for her coronary artery disease.  Also she may benefit from invasive workup for peripheral artery disease, most notably for chronic mesenteric ischemia as well as renal artery stenosis.  She needs aggressive medical management for lower extremity peripheral artery disease at this time, and absence of chronic limb ischemia. If no clinical worsening while inpatient, I recommend close follow up with her established cardiologist Dr Lamar Blinks in [place], [place] Management of partial SBO as you are doing.  Finally, given her ongoing cardiovascular abnormalities, consider transfer to [facility], should she need invasive workup and management.  Nehal Witting [facility] 12/22/2017, 8:43 AM   [place], MD [facility] Pager: [phone] Office: [phone] If no answer Cell [phone]

## 2017-12-22 NOTE — Progress Notes (Signed)
[facility] Progress Note  Lance MorinMary Agnes Fetterolf 84 y.o. June 29, 1933  CC:  Chronic diarrhea   Subjective: Patient only had 2 bowel movements today. No blood in stool. Abdominal pain improving but continues to mild abdominal discomfort and distention. Yesterday's cardiac events noted.   Objective: Vital signs in last 24 hours: Vitals:   12/22/17 0429 12/22/17 1334  BP: (!) 153/51 (!) 162/42  Pulse: 78 73  Resp: 18 20  Temp: (!) 97.3 F (36.3 C) 97.7 F (36.5 C)  SpO2: 94% 96%    Physical Exam:  Gender, alert and oriented 3. Not in acute distress Heart - rate rhythm regular. No murmur Abdomen: Mild distended. Hyperactive bowel sounds. Generalized discomfort but no significant tenderness. No peritoneal signs   Lab Results: Recent Labs    12/21/17 0600 12/22/17 0408  NA 139 135  K 3.9 3.6  CL 108 110  CO2 16* 16*  GLUCOSE 74 71  BUN 15 15  CREATININE 1.38* 1.32*  CALCIUM 9.0 8.4*   Recent Labs    12/20/17 1134 12/22/17 0408  AST 26 29  ALT 16 17  ALKPHOS 43 32*  BILITOT 0.9 0.6  PROT 7.3 5.9*  ALBUMIN 3.6 2.8*   Recent Labs    12/21/17 0600 12/22/17 0408  WBC 6.0 6.4  HGB 11.6* 10.8*  HCT 33.8* 31.7*  MCV 90.4 90.1  PLT 228 207   No results for input(s): LABPROT, INR in the last 72 hours.    Assessment/Plan: - Chronic diarrhea. Intermittent since October 2018. Previous stool studies negative. History of loose stool mostly after meals. Postprandial diarrhea could be from bile salt given her partial colectomy with possible resection of terminal ileum - Possible partial small bowel obstruction - ? Chronic mesenteric ischemia based on CT angio finding. Patient does not have significant weight loss. She does not have postprandial abdominal pain. - History of right hemicolectomy for cecal polyp. Report not available to review  Recommendations ----------------------- -  GI pathogen panel negative. C. difficile pending. - Consider trial of  Colestid 2 g at night for possible bile salt diarrhea once acute issues are resolved. - No further inpatient workup planned from GI standpoint. - [facility]. - Diet management per surgery, but okay to advance diet from GI standpoint - GI will sign off. Call us back if needed     Kathi DerParag Tyquan Carmickle MD, FACP 12/22/2017, 1:59 PM  Contact #  [phone]

## 2017-12-22 NOTE — Care Management Important Message (Signed)
Important Message  Patient Details IM Letter given to Rhonda/Case Manager to present to the Patient Name: Katie MorinMary Agnes [name] MRN: [id number] Date of Birth: [birth year]   Medicare Important Message Given:  Yes    Caren MacadamFuller, Teruko Joswick 12/22/2017, 10:23 AMImportant Message  Patient Details  Name: Katie MorinMary Agnes [name] MRN: [id number] Date of Birth: [birth year]   Medicare Important Message Given:  Yes    Caren MacadamFuller, Maizee Reinhold 12/22/2017, 10:23 AM

## 2017-12-23 ENCOUNTER — Inpatient Hospital Stay (HOSPITAL_COMMUNITY): Payer: Medicare HMO

## 2017-12-23 LAB — BASIC METABOLIC PANEL
Anion gap: 11 (ref 5–15)
BUN: 13 mg/dL (ref 6–20)
CALCIUM: 9.3 mg/dL (ref 8.9–10.3)
CO2: 17 mmol/L — ABNORMAL LOW (ref 22–32)
CREATININE: 1.36 mg/dL — AB (ref 0.44–1.00)
Chloride: 109 mmol/L (ref 101–111)
GFR calc Af Amer: 41 mL/min — ABNORMAL LOW (ref >60)
GFR, EST NON AFRICAN AMERICAN: 35 mL/min — AB (ref >60)
GLUCOSE: 89 mg/dL (ref 65–99)
POTASSIUM: 3.7 mmol/L (ref 3.5–5.1)
SODIUM: 137 mmol/L (ref 135–145)

## 2017-12-23 LAB — HEPARIN LEVEL (UNFRACTIONATED)
HEPARIN UNFRACTIONATED: 0.47 [IU]/mL (ref 0.30–0.70)
Heparin Unfractionated: 0.84 IU/mL — ABNORMAL HIGH (ref 0.30–0.70)
Heparin Unfractionated: 0.39 IU/mL (ref 0.30–0.70)

## 2017-12-23 LAB — CBC
HCT: 33 % — ABNORMAL LOW (ref 36.0–46.0)
Hemoglobin: 11.5 g/dL — ABNORMAL LOW (ref 12.0–15.0)
MCH: 31.2 pg (ref 26.0–34.0)
MCHC: 34.8 g/dL (ref 30.0–36.0)
MCV: 89.4 fL (ref 78.0–100.0)
PLATELETS: 241 10*3/uL (ref 150–400)
RBC: 3.69 MIL/uL — AB (ref 3.87–5.11)
RDW: 13.5 % (ref 11.5–15.5)
WBC: 7.7 10*3/uL (ref 4.0–10.5)

## 2017-12-23 LAB — OCCULT BLOOD GASTRIC / DUODENUM (SPECIMEN CUP)
Occult Blood, Gastric: POSITIVE — AB
pH, Gastric: 2

## 2017-12-23 LAB — MRSA PCR SCREENING: MRSA by PCR: NEGATIVE

## 2017-12-23 MED ORDER — DIATRIZOATE MEGLUMINE & SODIUM 66-10 % PO SOLN
90.0000 mL | Freq: Once | ORAL | Status: AC
  Administered 2017-12-23: 90 mL via NASOGASTRIC
  Filled 2017-12-23: qty 90

## 2017-12-23 MED ORDER — LABETALOL HCL 5 MG/ML IV SOLN
0.5000 mg/min | INTRAVENOUS | Status: DC
  Administered 2017-12-23: 3 mg/min via INTRAVENOUS
  Administered 2017-12-23: 0.5 mg/min via INTRAVENOUS
  Administered 2017-12-24 (×4): 2 mg/min via INTRAVENOUS
  Administered 2017-12-24: 1.5 mg/min via INTRAVENOUS
  Administered 2017-12-25: 2 mg/min via INTRAVENOUS
  Administered 2017-12-25 (×2): 3 mg/min via INTRAVENOUS
  Administered 2017-12-25: 1 mg/min via INTRAVENOUS
  Administered 2017-12-26: 2 mg/min via INTRAVENOUS
  Filled 2017-12-23 (×2): qty 80
  Filled 2017-12-23: qty 100
  Filled 2017-12-23 (×5): qty 80
  Filled 2017-12-23 (×3): qty 20
  Filled 2017-12-23: qty 80
  Filled 2017-12-23 (×3): qty 100
  Filled 2017-12-23: qty 80

## 2017-12-23 MED ORDER — HEPARIN (PORCINE) IN NACL 100-0.45 UNIT/ML-% IJ SOLN
550.0000 [IU]/h | INTRAMUSCULAR | Status: DC
  Administered 2017-12-23 – 2017-12-24 (×2): 500 [IU]/h via INTRAVENOUS
  Filled 2017-12-23 (×2): qty 250

## 2017-12-23 NOTE — Progress Notes (Signed)
    CC: Possible SBO  Subjective: She is vomiting this AM, and has vomited last PM.  Still distended.  I am ordering an NG.   Objective: Vital signs in last 24 hours: Temp:  [97.7 F (36.5 C)-98.2 F (36.8 C)] 98.2 F (36.8 C) (02/15 0508) Pulse Rate:  [67-73] 70 (02/15 0508) Resp:  [18-20] 18 (02/15 0508) BP: (149-183)/(41-46) 159/44 (02/15 0508) SpO2:  [94 %-98 %] 94 % (02/15 0508) Last BM Date: 12/23/17 P.o. for 80 IV 1593 Voided x9 Emesis x1 Stool x6 Afebrile vital signs are stable Troponins remain mildly elevated.   Creatinine mildly elevated at 1.36. WBC is normal. Film yesterday shows ongoing dilatation of the small bowel and air in the colon.   Intake/Output from previous day: 02/14 0701 - 02/15 0700 In: 2073.1 [P.O.:480; I.V.:1593.1] Out: -  Intake/Output this shift: Total I/O In: 0  Out: 600 [Emesis/NG output:600]  General appearance: alert, cooperative, mild distress and she feels terrible Resp: clear to auscultation bilaterally GI: distended, no bowel sound, still tender.    Lab Results:  Recent Labs    12/22/17 0408 12/23/17 0128  WBC 6.4 7.7  HGB 10.8* 11.5*  HCT 31.7* 33.0*  PLT 207 241    BMET Recent Labs    12/22/17 0408 12/23/17 0128  NA 135 137  K 3.6 3.7  CL 110 109  CO2 16* 17*  GLUCOSE 71 89  BUN 15 13  CREATININE 1.32* 1.36*  CALCIUM 8.4* 9.3   PT/INR No results for input(s): LABPROT, INR in the last 72 hours.  Recent Labs  Lab 12/18/17 0134 12/20/17 1134 12/22/17 0408  AST 30 26 29   ALT 20 16 17   ALKPHOS 53 43 32*  BILITOT 0.5 0.9 0.6  PROT 8.6* 7.3 5.9*  ALBUMIN 4.5 3.6 2.8*     Lipase     Component Value Date/Time   LIPASE 47 12/18/2017 0134     Medications: . aspirin  81 mg Oral Daily  . atorvastatin  80 mg Oral q1800  . cloNIDine  0.2 mg Transdermal Weekly  . diltiazem  240 mg Oral Daily  . irbesartan  300 mg Oral Daily   And  . hydrochlorothiazide  25 mg Oral Daily  .  isosorbide-hydrALAZINE  1 tablet Oral TID  . levothyroxine  50 mcg Oral QAC breakfast    Assessment/Plan Possible SBO/possible mesenteric ischemia Abdominal pain, cramps and diarrhea times 3 months. New-onset nausea and vomiting  New chest Pain with mild troponin elevation - cardiology following - no heparin History of polyps/polypectomy with colon perforation/partial colectomy CAD with history of MI/prior stents/[place]AB[place] x3 in [place] in [place]  Peripheral vascular occlusive disease with ABIs 0.55 on the right, 0.26 on the left History of tobacco use/asthma Hypertension Hypothyroid  FEN: IV fluids/NPO ID: none DVT: Heparin drip Foley: None Follow up: TBD   Plan:  I have ordered an NG, and will put her on suction. Nursing says she is vomiting brown colored emesis.  If we get her decompressed we can try the Small bowel protocol later today.  Dr. Randie Heinzain has seen her for vascular also.   LOS: 4 days    Arlicia Paquette 12/23/2017 [phone]

## 2017-12-23 NOTE — Progress Notes (Signed)
ANTICOAGULATION CONSULT NOTE - Follow Up Consult  Pharmacy Consult for heparin  Indication: r/o ACS  Allergies  Allergen Reactions  . Clams [Shellfish Allergy] Anaphylaxis  . Other Other (See Comments)    Red Dye "Bright Color Medicines" Constipation swelling  . Red Dye Other (See Comments)    Constipation     Patient Measurements: Height: 4\' 9"  (144.8 cm) Weight: 139 lb 1.8 oz (63.1 kg) IBW/kg (Calculated) : 38.6 Heparin Dosing Weight: 53 kg  Vital Signs: Temp: 98.4 F (36.9 C) (02/15 1411) Temp Source: Oral (02/15 1411) BP: 206/58 (02/15 1411) Pulse Rate: 79 (02/15 1411)  Labs: Recent Labs    12/21/17 0600  12/22/17 0408 12/22/17 0955 12/22/17 1512 12/22/17 2108 12/23/17 0128 12/23/17 1350  HGB 11.6*  --  10.8*  --   --   --  11.5*  --   HCT 33.8*  --  31.7*  --   --   --  33.0*  --   PLT 228  --  207  --   --   --  241  --   HEPARINUNFRC  --   --   --   --  1.04*  --  0.84* 0.47  CREATININE 1.38*  --  1.32*  --   --   --  1.36*  --   TROPONINI  --    < > 0.14* 0.11* 0.08* 0.07*  --   --    < > = values in this interval not displayed.    Estimated Creatinine Clearance: 24.4 mL/min (A) (by C-G formula based on SCr of 1.36 mg/dL (H)).   Assessment: Patient's an 84 y.o F with hx CAD s/p CABG in 2008 and HTN presented to the ED on 12/18/17 with c/o leg cramps, n/v and abdominal pain.  Heparin drip started on 12/22/17 for elevated troponin.  Abd X-ray showed findings consistent with "a degree of persistent bowel obstruction." Cardiology team recommends to continue with heparin drip for now and to proceed with stress test when GI issues have resolved.  Today, 12/23/2017: - Heparin level is now therapeutic with current rate of 500 units/hr - cbc relatively stable - no bleeding documented  Goal of Therapy:  Heparin level 0.3-0.7 units/ml Monitor platelets by anticoagulation protocol: Yes   Plan:  - continue heparin drip at 500 units/hr - recheck confirmatory  level in 8 hrs - monitor for s/s bleeding  Bernadene Personrew Breahna Boylen, PharmD, BCPS [phone] 12/23/2017, 4:04 PM

## 2017-12-23 NOTE — Progress Notes (Signed)
Chart reviewed.  Blood pressure improved on current medications.  He'll function relatively stable.  Recommend inpatient nuclear stress test when stable from GI complaints.  Further recommendations after stress test.  Elder NegusManish J Chalon Zobrist, MD [facility] Pager: [phone] Office: [phone] If no answer Cell [phone]

## 2017-12-23 NOTE — Progress Notes (Signed)
ANTICOAGULATION CONSULT NOTE - Follow Up Consult  Pharmacy Consult for Heparin Indication: chest pain/ACS  Allergies  Allergen Reactions  . Clams [Shellfish Allergy] Anaphylaxis  . Other Other (See Comments)    Red Dye "Bright Color Medicines" Constipation swelling  . Red Dye Other (See Comments)    Constipation     Patient Measurements: Height: 4\' 9"  (144.8 cm) Weight: 139 lb 1.8 oz (63.1 kg) IBW/kg (Calculated) : 38.6 Heparin Dosing Weight:   Vital Signs: Temp: 98.2 F (36.8 C) (02/14 2147) Temp Source: Oral (02/14 2147) BP: 183/46 (02/14 2147) Pulse Rate: 71 (02/14 2147)  Labs: Recent Labs    12/21/17 0600  12/22/17 0408 12/22/17 0955 12/22/17 1512 12/22/17 2108 12/23/17 0128  HGB 11.6*  --  10.8*  --   --   --  11.5*  HCT 33.8*  --  31.7*  --   --   --  33.0*  PLT 228  --  207  --   --   --  241  HEPARINUNFRC  --   --   --   --  1.04*  --  0.84*  CREATININE 1.38*  --  1.32*  --   --   --  1.36*  TROPONINI  --    < > 0.14* 0.11* 0.08* 0.07*  --    < > = values in this interval not displayed.    Estimated Creatinine Clearance: 24.4 mL/min (A) (by C-G formula based on SCr of 1.36 mg/dL (H)).   Medications:  Infusions:  . sodium chloride 75 mL/hr at 12/22/17 1739  . heparin      Assessment: Patient with high heparin level.  No heparin issues per RN.  Goal of Therapy:  Heparin level 0.3-0.7 units/ml Monitor platelets by anticoagulation protocol: Yes   Plan:  Hold heparin ~ 1hr, Resume heparin at 0300 at 500 units/hr Recheck heparin level at 1200  [address]Grimsley Jr, HolmenJulian Crowford 12/23/2017,2:10 AM

## 2017-12-23 NOTE — Progress Notes (Signed)
PROGRESS NOTE    Katie MorinMary Agnes [name]  WUJ:[id number] DOB: 1933/07/26 DOA: 12/18/2017 PCP: Macy MisBriscoe, Katie K, MD  Brief Narrative :84 y.o.femalewitha history of CAD s/p CABG x3 2008, partial colectomy for invasive polyp and take back for postop infection remotely, HTN, hypothyroidism, HLD, and COPD who presented to the ED after she awoke with leg cramping and noted significant nausea, vomiting and generalized abdominal pain since last evening. This has happened sporadically for a few weeks, but was more severe than previously and associated with loose stools and abdominal bloating. She was hypertensive and afebrile in the ED with continued vomiting, AKI, leukocytosis and ketonuria. CT showed evidence of small bowel dysmotility, but no obstruction. It also noted diffuse aortic atherosclerosis and severe infrarenal luminal narrowing. Admission was requested for dehydration, AKI due to intractable nausea and vomiting due to presumed viral gastroenteritis.  Review of Systems:She denies any suspicious ingestion, fever, sick contacts, hematemesis, hematochezia, melena. She had been eating less that day as well. She has had the leg cramping that awakens her at night for months without any directed treatment, denying claudication,and per HPI. All others reviewed and are negative.  12/22/2017 overnight patient had episodes of chest pain which was relieved after giving medications to decrease her blood pressure.  Her blood pressure was elevated in spite of multiple dose of hydralazine.  She also reports that after the hydralazine last 2 nights she had chest pain.  The first night she had chest pain she did not tell anyone.  Notes from overnight events noted.  She has an elevated troponin.   Assessment & Plan:   Principal Problem:   Viral gastroenteritis Active Problems:   AKI (acute kidney injury) (HCC)   Aortic atherosclerosis (HCC)   CAD (coronary artery disease)   HTN (hypertension)   Hypothyroidism  UTI (urinary tract infection)   Generalized abdominal pain   Nausea and vomiting   Generalized abdominal tenderness   Chest pain  1] CAD history of CABG/HTN- with chest pain overnight with elevated troponin secondary to uncontrolled blood pressure.  Patient will have to be moved to the ICU for IV labetalol.  2]Intractable nausea, vomiting, diarrhea, abdominal pain: CT with suggestion of dysmotility, no obstruction. Will treat empirically for gastroenteritis. Note pt was seen by GI for postprandial giarrhea in Jan, started on fiber supplement, no further work up planned.  Patient started vomiting overnight and had multiple episodes of vomiting ever since.  Surgery has placed an NG tube which is sectioned over 800 cc of brown colored fluid.  Will make her n.p.o. and continue IV fluids.  KUB done today shows partial small bowel obstruction still.  AKI: SCr1.38due to dehydration.  - IVF and recheck BMP - Monitor UOP.   Aortic atherosclerosis with infrarenal luminal narrowing: Appreciate Dr. Carola RhineKane's input.  He thinks patient's ABI is showing severe PAD.  However he wants to wait and follow and see her as an outpatient once her current issues have been resolved.  CAD: s/p CABG x3 in 2008, [facility]. No chest pain, dyspnea.   HTN: We will start IV labetalol drip for better blood pressure control since patient is n.p.o.   -Asthma:  - Continue singulair, no inhalers at home, no wheezing currently  Hypothyroidism: Chronic, stable - Continue synthroid       DVT prophylaxis: Heparin Code Status: Full code Family Communication: Discussed with husband Disposition Plan TBD Consultants:  Cardiology, general surgery, GI Procedures: None Antimicrobials: None Subjective: Complains of vomiting and nausea  overnight  Objective: Vitals:   12/22/17 1334 12/22/17 1734 12/22/17 2147 12/23/17 0508  BP: (!) 162/42 (!) 149/41 (!) 183/46 (!) 159/44  Pulse: 73 67 71 70    Resp: 20 18 18 18   Temp: 97.7 F (36.5 C)  98.2 F (36.8 C) 98.2 F (36.8 C)  TempSrc: Oral  Oral Oral  SpO2: 96% 98% 94% 94%  Weight:      Height:        Intake/Output Summary (Last 24 hours) at 12/23/2017 1340 Last data filed at 12/23/2017 1328 Gross per 24 hour  Intake 1833.13 ml  Output 1400 ml  Net 433.13 ml   Filed Weights   12/18/17 0651  Weight: 63.1 kg (139 lb 1.8 oz)    Examination:  General exam: Appears calm and comfortable  Respiratory system: Clear to auscultation. Respiratory effort normal. Cardiovascular system: S1 & S2 heard, RRR. No JVD, murmurs, rubs, gallops or clicks. No pedal edema. Gastrointestinal system: Abdomen is distended, soft and tender. No organomegaly or masses felt. Normal bowel sounds heard. Central nervous system: Alert and oriented. No focal neurological deficits. Extremities: Symmetric 5 x 5 power. Skin: No rashes, lesions or ulcers Psychiatry: Judgement and insight appear normal. Mood & affect appropriate.     Data Reviewed: I have personally reviewed following labs and imaging studies  CBC: Recent Labs  Lab 12/19/17 0541 12/20/17 1134 12/21/17 0600 12/22/17 0408 12/23/17 0128  WBC 11.4* 7.2 6.0 6.4 7.7  HGB 13.5 12.7 11.6* 10.8* 11.5*  HCT 39.9 38.0 33.8* 31.7* 33.0*  MCV 90.7 90.5 90.4 90.1 89.4  PLT 267 252 228 207 241   Basic Metabolic Panel: Recent Labs  Lab 12/19/17 0541 12/20/17 1134 12/21/17 0600 12/22/17 0408 12/23/17 0128  NA 133* 137 139 135 137  K 4.2 4.5 3.9 3.6 3.7  CL 102 105 108 110 109  CO2 21* 21* 16* 16* 17*  GLUCOSE 97 82 74 71 89  BUN 18 15 15 15 13   CREATININE 1.45* 1.37* 1.38* 1.32* 1.36*  CALCIUM 9.3 9.3 9.0 8.4* 9.3   GFR: Estimated Creatinine Clearance: 24.4 mL/min (A) (by C-G formula based on SCr of 1.36 mg/dL (H)). Liver Function Tests: Recent Labs  Lab 12/18/17 0134 12/20/17 1134 12/22/17 0408  AST 30 26 29   ALT 20 16 17   ALKPHOS 53 43 32*  BILITOT 0.5 0.9 0.6  PROT 8.6*  7.3 5.9*  ALBUMIN 4.5 3.6 2.8*   Recent Labs  Lab 12/18/17 0134  LIPASE 47   No results for input(s): AMMONIA in the last 168 hours. Coagulation Profile: No results for input(s): INR, PROTIME in the last 168 hours. Cardiac Enzymes: Recent Labs  Lab 12/21/17 2224 12/22/17 0408 12/22/17 0955 12/22/17 1512 12/22/17 2108  TROPONINI 0.06* 0.14* 0.11* 0.08* 0.07*   BNP (last 3 results) No results for input(s): PROBNP in the last 8760 hours. HbA1C: No results for input(s): HGBA1C in the last 72 hours. CBG: Recent Labs  Lab 12/18/17 2210  GLUCAP 99   Lipid Profile: No results for input(s): CHOL, HDL, LDLCALC, TRIG, CHOLHDL, LDLDIRECT in the last 72 hours. Thyroid Function Tests: No results for input(s): TSH, T4TOTAL, FREET4, T3FREE, THYROIDAB in the last 72 hours. Anemia Panel: No results for input(s): VITAMINB12, FOLATE, FERRITIN, TIBC, IRON, RETICCTPCT in the last 72 hours. Sepsis Labs: Recent Labs  Lab 12/20/17 1134  LATICACIDVEN 1.0    Recent Results (from the past 240 hour(s))  Gastrointestinal Panel by PCR , Stool     Status: None  Collection Time: 12/20/17  4:35 PM  Result Value Ref Range Status   Campylobacter species NOT DETECTED NOT DETECTED Final   Plesimonas shigelloides NOT DETECTED NOT DETECTED Final   Salmonella species NOT DETECTED NOT DETECTED Final   Yersinia enterocolitica NOT DETECTED NOT DETECTED Final   Vibrio species NOT DETECTED NOT DETECTED Final   Vibrio cholerae NOT DETECTED NOT DETECTED Final   Enteroaggregative E coli (EAEC) NOT DETECTED NOT DETECTED Final   Enteropathogenic E coli (EPEC) NOT DETECTED NOT DETECTED Final   Enterotoxigenic E coli (ETEC) NOT DETECTED NOT DETECTED Final   Shiga like toxin producing E coli (STEC) NOT DETECTED NOT DETECTED Final   Shigella/Enteroinvasive E coli (EIEC) NOT DETECTED NOT DETECTED Final   Cryptosporidium NOT DETECTED NOT DETECTED Final   Cyclospora cayetanensis NOT DETECTED NOT DETECTED Final    Entamoeba histolytica NOT DETECTED NOT DETECTED Final   Giardia lamblia NOT DETECTED NOT DETECTED Final   Adenovirus F40/41 NOT DETECTED NOT DETECTED Final   Astrovirus NOT DETECTED NOT DETECTED Final   Norovirus GI/GII NOT DETECTED NOT DETECTED Final   Rotavirus A NOT DETECTED NOT DETECTED Final   Sapovirus (I, II, IV, and V) NOT DETECTED NOT DETECTED Final    Comment: Performed at [facility], [address] [place], [place] [address]  C difficile quick scan w PCR reflex     Status: None   Collection Time: 12/21/17  5:34 PM  Result Value Ref Range Status   C Diff antigen NEGATIVE NEGATIVE Final   C Diff toxin NEGATIVE NEGATIVE Final   C Diff interpretation No C. difficile detected.  Final    Comment: Performed at [facility], [address] [place], [place] [address]         Radiology Studies: Dg Abd 1 View  Result Date: 12/23/2017 CLINICAL DATA:  Abdominal pain and nausea. Recent bowel obstruction. EXAM: ABDOMEN - 1 VIEW COMPARISON:  December 22, 2017 FINDINGS: Nasogastric tube tip and side port in stomach. There are multiple loops of dilated small bowel. There is also a moderate air in the stomach. There is modest air in the colon. No appreciable air-fluid levels. No free air. There are iliac artery calcifications bilaterally. Lung bases are clear. IMPRESSION: Findings most consistent with a degree of persistent bowel obstruction, similar to 1 day prior. No free air. Nasogastric tube tip and side port in stomach. There is iliac artery atherosclerotic calcification bilaterally. Electronically Signed   By: Bretta Bang III M.D.   On: 12/23/2017 12:27   Dg Abd 1 View  Result Date: 12/22/2017 CLINICAL DATA:  Follow-up small bowel obstruction.  Abdominal pain. EXAM: ABDOMEN - 1 VIEW COMPARISON:  12/21/2017 FINDINGS: Multiple mildly dilated small bowel loops show no significant change. There is a relative paucity of colonic gas. These findings are  consistent with mid to distal small bowel obstruction, and without significant change. IMPRESSION: Mid to distal small bowel obstruction, without significant change. Electronically Signed   By: Myles Rosenthal M.D.   On: 12/22/2017 07:21        Scheduled Meds: . cloNIDine  0.2 mg Transdermal Weekly   Continuous Infusions: . sodium chloride 75 mL/hr at 12/23/17 0633  . heparin 500 Units/hr (12/23/17 0304)  . labetalol (NORMODYNE) infusion       LOS: 4 days      Alwyn Ren, MD [facility]  If 7PM-7AM, please contact night-coverage [email or web address] Password [email or web address] 12/23/2017, 1:40 PM

## 2017-12-23 NOTE — Progress Notes (Signed)
Pt has been complaining of N&V on and off throughout the night. Gave zofran ~0200. Seemed to have helped for about an hour and then she had an episode of vomiting (medium amount of emesis). Gave 12.5mg  phenergan. Pt says her vomiting has ceased but her nausea still comes in waves with retching but no emesis. Will relay the message to dayshift and will continue to monitor.

## 2017-12-23 NOTE — Progress Notes (Signed)
Patient transferred to SDU after NG placed and cardiology notified. Report given to [employer] Christian.

## 2017-12-24 ENCOUNTER — Inpatient Hospital Stay (HOSPITAL_COMMUNITY): Payer: Medicare HMO

## 2017-12-24 LAB — COMPREHENSIVE METABOLIC PANEL
ALBUMIN: 3.1 g/dL — AB (ref 3.5–5.0)
ALT: 22 U/L (ref 14–54)
AST: 29 U/L (ref 15–41)
Alkaline Phosphatase: 37 U/L — ABNORMAL LOW (ref 38–126)
Anion gap: 10 (ref 5–15)
BUN: 13 mg/dL (ref 6–20)
CHLORIDE: 108 mmol/L (ref 101–111)
CO2: 19 mmol/L — AB (ref 22–32)
CREATININE: 1.55 mg/dL — AB (ref 0.44–1.00)
Calcium: 9 mg/dL (ref 8.9–10.3)
GFR calc Af Amer: 35 mL/min — ABNORMAL LOW (ref >60)
GFR, EST NON AFRICAN AMERICAN: 30 mL/min — AB (ref >60)
Glucose, Bld: 108 mg/dL — ABNORMAL HIGH (ref 65–99)
POTASSIUM: 3.1 mmol/L — AB (ref 3.5–5.1)
SODIUM: 137 mmol/L (ref 135–145)
Total Bilirubin: 0.8 mg/dL (ref 0.3–1.2)
Total Protein: 6.4 g/dL — ABNORMAL LOW (ref 6.5–8.1)

## 2017-12-24 LAB — CBC
HCT: 32 % — ABNORMAL LOW (ref 36.0–46.0)
Hemoglobin: 11.1 g/dL — ABNORMAL LOW (ref 12.0–15.0)
MCH: 30.7 pg (ref 26.0–34.0)
MCHC: 34.7 g/dL (ref 30.0–36.0)
MCV: 88.6 fL (ref 78.0–100.0)
PLATELETS: 237 10*3/uL (ref 150–400)
RBC: 3.61 MIL/uL — ABNORMAL LOW (ref 3.87–5.11)
RDW: 13.5 % (ref 11.5–15.5)
WBC: 5.4 10*3/uL (ref 4.0–10.5)

## 2017-12-24 LAB — HEPARIN LEVEL (UNFRACTIONATED): HEPARIN UNFRACTIONATED: 0.32 [IU]/mL (ref 0.30–0.70)

## 2017-12-24 MED ORDER — CIPROFLOXACIN IN D5W 200 MG/100ML IV SOLN
200.0000 mg | Freq: Two times a day (BID) | INTRAVENOUS | Status: DC
  Administered 2017-12-24 – 2017-12-28 (×8): 200 mg via INTRAVENOUS
  Filled 2017-12-24 (×9): qty 100

## 2017-12-24 MED ORDER — POTASSIUM CHLORIDE CRYS ER 20 MEQ PO TBCR
40.0000 meq | EXTENDED_RELEASE_TABLET | Freq: Once | ORAL | Status: DC
  Filled 2017-12-24: qty 2

## 2017-12-24 MED ORDER — LIP MEDEX EX OINT
TOPICAL_OINTMENT | CUTANEOUS | Status: AC
  Filled 2017-12-24: qty 7

## 2017-12-24 MED ORDER — POTASSIUM CHLORIDE 10 MEQ/100ML IV SOLN
10.0000 meq | INTRAVENOUS | Status: AC
  Administered 2017-12-24 (×4): 10 meq via INTRAVENOUS
  Filled 2017-12-24 (×4): qty 100

## 2017-12-24 MED ORDER — POTASSIUM CHLORIDE 10 MEQ/50ML IV SOLN
10.0000 meq | INTRAVENOUS | Status: DC
  Filled 2017-12-24 (×4): qty 50

## 2017-12-24 MED ORDER — METRONIDAZOLE IN NACL 5-0.79 MG/ML-% IV SOLN
500.0000 mg | Freq: Three times a day (TID) | INTRAVENOUS | Status: DC
  Administered 2017-12-24 – 2017-12-28 (×12): 500 mg via INTRAVENOUS
  Filled 2017-12-24 (×13): qty 100

## 2017-12-24 NOTE — Progress Notes (Signed)
[facility]OC[facility] FOR AM LAB REPLACEMENT ONLY  The [facility] does [facility] based on the criteria listed below:   1. Is GFR >/= 40 ml/min? Yes.    Patient's GFR today is 42  2. Is urine output >/= 0.5 ml/kg/hr for the last 6 hours? Yes.   Patient's UOP is 0.8  ml/kg/hr 3. Is BUN < 60 mg/dL? Yes.    Patient's BUN today is 15  4. Abnormal electrolyte(s): k 3.1  5. Ordered repletion with: protocol 6. If a panic level lab has been reported, has the CCM MD in charge been notified? No..   Physician:    Markus DaftWHELAN, Jolyn Deshmukh A 12/24/2017 6:32 AM

## 2017-12-24 NOTE — Progress Notes (Addendum)
Pt. States that she hasn't eaten since 12/17/17. Has been NPO since admission and has lost 3.2 kg as of the time of this note. Notified [facility] floor coverage. Will continue to monitor.

## 2017-12-24 NOTE — Progress Notes (Signed)
Subjective:  Abdominal pain improved.  Complains of retrosternal pain while swallowing Blood pressure much improved on labetalol drip  Objective:  Vital Signs in the last 24 hours: Temp:  [97.7 F (36.5 C)-98.6 F (37 C)] 97.8 F (36.6 C) (02/16 0730) Pulse Rate:  [66-89] 67 (02/16 0900) Resp:  [11-21] 21 (02/16 0900) BP: (143-236)/(31-60) 175/41 (02/16 0900) SpO2:  [93 %-96 %] 96 % (02/16 0900) Weight:  [63.1 kg (139 lb 1.8 oz)] 63.1 kg (139 lb 1.8 oz) (02/16 0356)  Intake/Output from previous day: 02/15 0701 - 02/16 0700 In: 1558.6 [I.V.:1468.6; NG/GT:90] Out: 1600 [Emesis/NG output:1600] Intake/Output from this shift: Total I/O In: 522 [I.V.:422; IV Piggyback:100] Out: -   Urine output not accurately measured.   Physical Exam:  Nursing note and vitals reviewed. Constitutional: She appears well-developed and well-nourished.  HENT:  Head: Normocephalic and atraumatic.  Eyes: Conjunctivae are normal. Pupils are equal, round, and reactive to light.  Neck: Normal range of motion. Neck supple. No JVD present.  Cardiovascular: Normal rate, regular rhythm and normal heart sounds.  No murmur heard. Pulses:      Carotid pulses are on the right side with bruit.      Femoral pulses are 2+ on the right side, and 1+ on the left side.      Dorsalis pedis pulses are 0 on the right side, and 0 on the left side.       Posterior tibial pulses are 0 on the right side, and 0 on the left side.  Respiratory: Effort normal and breath sounds normal. She has no wheezes. She has no rales.  Abdominal: No abdominal tenderness. NG tube in place Musculoskeletal: She exhibits no edema.      Lab Results: Recent Labs    12/23/17 0128 12/24/17 0245  WBC 7.7 5.4  HGB 11.5* 11.1*  PLT 241 237   Recent Labs    12/23/17 0128 12/24/17 0245  NA 137 137  K 3.7 3.1*  CL 109 108  CO2 17* 19*  GLUCOSE 89 108*  BUN 13 13  CREATININE 1.36* 1.55*   Recent Labs    12/22/17 1512  12/22/17 2108  TROPONINI 0.08* 0.07*   Hepatic Function Panel Recent Labs    12/24/17 0245  PROT 6.4*  ALBUMIN 3.1*  AST 29  ALT 22  ALKPHOS 37*  BILITOT 0.8     EKG 12/21/2017: Sinus rhythm. Normal axis. First degree AV block. Left ventricular hypertrophy. Left atrial enlargement. Inferolateral ST-T changes, likely due to LVH. Cannot exclude ischemia.    Imaging: Abdominal xray 12/24/2017 CLINICAL DATA:  Small-bowel obstruction, 8 hour delayed image.  EXAM: PORTABLE ABDOMEN - 1 VIEW  COMPARISON:  12/23/2017 at 1159 hours  FINDINGS: Enteric contrast is seen within the stomach with gastric tube in place. Persistent unchanged dilatation of small bowel loops compatible small bowel obstruction is identified. Flocculent contrast within the stomach is noted without antegrade progression.  IMPRESSION: Unchanged small bowel dilatation compatible with high-grade partial or early small obstruction. Contrast remains within the stomach at 8 hours from the start of imaging.   Electronically Signed   By: Tollie Eth M.D.   On: 12/24/2017 01:59  Cardiac Studies: Echocardiogram 12/22/2017: Study Conclusions  - Left ventricle: The cavity size was normal. There was severe   concentric hypertrophy. Systolic function was vigorous. The   estimated ejection fraction was in the range of 65% to 70%. Wall   motion was normal; there were no regional wall motion   abnormalities.  Doppler parameters are consistent with abnormal   left ventricular relaxation (grade 1 diastolic dysfunction). The   E/e&' ratio is >15, suggesting elevated LV filling pressure. - Mitral valve: Mildly thickened leaflets . There was mild   regurgitation. - Left atrium: The atrium was normal in size. - Tricuspid valve: There was mild regurgitation. - Pulmonary arteries: PA peak pressure: 35 mm Hg (S). - Inferior vena cava: The vessel was normal in size. The   respirophasic diameter changes were in  the normal range (>= 50%),   consistent with normal central venous pressure.  Impressions:  - LVEF 65-70%, there is severe LVH, normal wall motion, grade 1 DD,   elevated LV filling pressure, mild MR, normal LA size, mild TR,   RVSP 35 mmHg, normal IVC.  Assessment:  84 year old female with coronary artery disease status post CABG Troponin elevation: While the patient has underlying carotid artery disease, overnight event of chest pain and troponin elevation likely a type II MI event in the setting of uncontrolled blood pressure. 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 Currently on clonidine 0.2 mg patch, on iv hydralazine prn. No other scheduled medications. Hypothyroidism  Recommendations: Agree with IV labetalol drip for blood pressure control, while patient is NPO. Continue IV heparin ASA 81, lipitor 80, when feasible Recommend nuclear stress test when medically stable. Rest of the management per the primary team.  Will follow after stress test.    LOS: 5 days    Avyan Livesay J Lonnell Chaput 12/24/2017, 11:13 AM  Elder NegusManish J Shataria Crist, MD [facility] Pager: [phone] Office: [phone] If no answer Cell [phone]

## 2017-12-24 NOTE — Progress Notes (Signed)
ANTICOAGULATION CONSULT NOTE - Follow Up Consult  Pharmacy Consult for heparin  Indication: r/o ACS  Allergies  Allergen Reactions  . Clams [Shellfish Allergy] Anaphylaxis  . Other Other (See Comments)    Red Dye "Bright Color Medicines" Constipation swelling  . Red Dye Other (See Comments)    Constipation     Patient Measurements: Height: 4\' 9"  (144.8 cm) Weight: 139 lb 1.8 oz (63.1 kg) IBW/kg (Calculated) : 38.6 Heparin Dosing Weight: 53 kg  Vital Signs: Temp: 97.8 F (36.6 C) (02/16 0730) Temp Source: Oral (02/16 0730) BP: 175/41 (02/16 0900) Pulse Rate: 67 (02/16 0900)  Labs: Recent Labs    12/22/17 0408 12/22/17 0955  12/22/17 1512 12/22/17 2108 12/23/17 0128 12/23/17 1350 12/23/17 2047 12/24/17 0245 12/24/17 0739  HGB 10.8*  --   --   --   --  11.5*  --   --  11.1*  --   HCT 31.7*  --   --   --   --  33.0*  --   --  32.0*  --   PLT 207  --   --   --   --  241  --   --  237  --   HEPARINUNFRC  --   --    < > 1.04*  --  0.84* 0.47 0.39  --  0.32  CREATININE 1.32*  --   --   --   --  1.36*  --   --  1.55*  --   TROPONINI 0.14* 0.11*  --  0.08* 0.07*  --   --   --   --   --    < > = values in this interval not displayed.    Estimated Creatinine Clearance: 21.4 mL/min (A) (by C-G formula based on SCr of 1.55 mg/dL (H)).   Assessment: Patient's an 84 y.o F with hx CAD s/p CABG in 2008 and HTN presented to the ED on 12/18/17 with c/o leg cramps, n/v and abdominal pain.  Heparin drip started on 12/22/17 for elevated troponin.  Abd X-ray showed findings consistent with "a degree of persistent bowel obstruction." Cardiology team recommends to continue with heparin drip for now and to proceed with stress test when GI issues have resolved.  Today, 12/24/2017: - Heparin level remains in the therapeutic range on 500 units/hr - cbc relatively stable - no bleeding documented except one episode of reddish mucous. RN monitoring.  Goal of Therapy:  Heparin level 0.3-0.7  units/ml Monitor platelets by anticoagulation protocol: Yes   Plan:  - continue heparin drip at 500 units/hr - check heparin level daily and CBC q48h minimum - monitor for s/s bleeding  Charolotte Ekeom Carlester Kasparek, PharmD. Mobile: [phone]. 12/24/2017,9:35 AM.

## 2017-12-24 NOTE — Progress Notes (Signed)
CC: Possible SBO  Subjective: No n/v since NG placed. Having multiple loose BMs. Denies abdominal pain and thinks distention has improved a lot.  Objective: Vital signs in last 24 hours: Temp:  [97.7 F (36.5 C)-98.6 F (37 C)] 97.7 F (36.5 C) (02/16 0356) Pulse Rate:  [73-89] 73 (02/16 0400) Resp:  [11-21] 19 (02/16 0400) BP: (143-236)/(33-60) 155/60 (02/16 0400) SpO2:  [93 %-96 %] 96 % (02/16 0400) Weight:  [63.1 kg (139 lb 1.8 oz)] 63.1 kg (139 lb 1.8 oz) (02/16 0356) Last BM Date: 12/23/17 P.o. for 80 IV 1593 Voided x8 NG Tube: 1L since placement; 600cc emesis prior BM x4 Afebrile vital signs are normal On heparin gtt per cardiology for possible ACS Cr up trending - 1.55 from 1.36 WBC remains normal.   Intake/Output from previous day: 02/15 0701 - 02/16 0700 In: 1558.6 [I.V.:1468.6; NG/GT:90] Out: 1600 [Emesis/NG output:1600] Intake/Output this shift: No intake/output data recorded.  General appearance: alert, cooperative, no distress and . Resp: clear to auscultation bilaterally GI: soft, nontender, mildly distended  Lab Results:  Recent Labs    12/23/17 0128 12/24/17 0245  WBC 7.7 5.4  HGB 11.5* 11.1*  HCT 33.0* 32.0*  PLT 241 237    BMET Recent Labs    12/23/17 0128 12/24/17 0245  NA 137 137  K 3.7 3.1*  CL 109 108  CO2 17* 19*  GLUCOSE 89 108*  BUN 13 13  CREATININE 1.36* 1.55*  CALCIUM 9.3 9.0   PT/INR No results for input(s): LABPROT, INR in the last 72 hours.  Recent Labs  Lab 12/18/17 0134 12/20/17 1134 12/22/17 0408 12/24/17 0245  AST 30 26 29 29   ALT 20 16 17 22   ALKPHOS 53 43 32* 37*  BILITOT 0.5 0.9 0.6 0.8  PROT 8.6* 7.3 5.9* 6.4*  ALBUMIN 4.5 3.6 2.8* 3.1*     Lipase     Component Value Date/Time   LIPASE 47 12/18/2017 0134     Medications: . cloNIDine  0.2 mg Transdermal Weekly    Assessment/Plan Possible SBO vs gastroenteritis Abdominal pain, cramps and diarrhea times 3 months. New-onset nausea and  vomiting  New chest Pain with mild troponin elevation - cardiology following, on heparin gtt History of polyps/polypectomy with colon perforation/partial colectomy CAD with history of MI/prior stents/[place] x3 in [place] cardiologist [place] in [place]  Peripheral vascular occlusive disease with ABIs 0.55 on the right, 0.26 on the left - vascular evaluated; CTA showed no significant SMA occlusion History of tobacco use/asthma Hypertension Hypothyroid  FEN: IV fluids/NPO/NG tube today ID: none DVT: Heparin drip Foley: None Follow up: TBD   Plan: Continue NPO/NG tube/IVF today. Having multiple BMs which are liquid/watery. F/u abd xr showed contrast retention at 8hrs and dilated loops of small bowel - given her multiple BMs all could potentially be explained by some kind of gastroenteritis; does not seem to be high grade SBO given her bowel activity and complete lack of abdominal pain.   LOS: 5 days    Stephanie CoupChristopher M [facility] 12/24/2017

## 2017-12-24 NOTE — Progress Notes (Signed)
ANTICOAGULATION CONSULT NOTE - Follow Up Consult  Pharmacy Consult for Heparin Indication: chest pain/ACS  Allergies  Allergen Reactions  . Clams [Shellfish Allergy] Anaphylaxis  . Other Other (See Comments)    Red Dye "Bright Color Medicines" Constipation swelling  . Red Dye Other (See Comments)    Constipation     Patient Measurements: Height: 4\' 9"  (144.8 cm) Weight: 139 lb 1.8 oz (63.1 kg) IBW/kg (Calculated) : 38.6 Heparin Dosing Weight:   Vital Signs: Temp: 97.7 F (36.5 C) (02/16 0356) Temp Source: Oral (02/16 0356) BP: 155/60 (02/16 0400) Pulse Rate: 73 (02/16 0400)  Labs: Recent Labs    12/22/17 0408 12/22/17 0955  12/22/17 1512 12/22/17 2108 12/23/17 0128 12/23/17 1350 12/23/17 2047 12/24/17 0245  HGB 10.8*  --   --   --   --  11.5*  --   --  11.1*  HCT 31.7*  --   --   --   --  33.0*  --   --  32.0*  PLT 207  --   --   --   --  241  --   --  237  HEPARINUNFRC  --   --    < > 1.04*  --  0.84* 0.47 0.39  --   CREATININE 1.32*  --   --   --   --  1.36*  --   --  1.55*  TROPONINI 0.14* 0.11*  --  0.08* 0.07*  --   --   --   --    < > = values in this interval not displayed.    Estimated Creatinine Clearance: 21.4 mL/min (A) (by C-G formula based on SCr of 1.55 mg/dL (H)).   Medications:  Infusions:  . sodium chloride 75 mL/hr at 12/24/17 0500  . heparin 500 Units/hr (12/24/17 0500)  . labetalol (NORMODYNE) infusion 1 mg/min (12/24/17 0500)    Assessment: Patient with heparin level at goal.  No heparin issues noted.  Goal of Therapy:  Heparin level 0.3-0.7 units/ml Monitor platelets by anticoagulation protocol: Yes   Plan:  Continue heparin drip at current rate Recheck level at 0800  Katie [name], Katie [name] 12/24/2017,6:00 AM

## 2017-12-24 NOTE — Progress Notes (Signed)
PROGRESS NOTE    Katie MorinMary Agnes [name]  ZOX:[id number] DOB: Jun 19, 1933 DOA: 12/18/2017 PCP: Macy MisBriscoe, Kim K, MD Brief Katie FractionNarrative84 y.o.femalewitha history of CAD s/p CABG x3 2008, partial colectomy for invasive polyp and take back for postop infection remotely, HTN, hypothyroidism, HLD, and COPD who presented to the ED after she awoke with leg cramping and noted significant nausea, vomiting and generalized abdominal pain since last evening. This has happened sporadically for a few weeks, but was more severe than previously and associated with loose stools and abdominal bloating. She was hypertensive and afebrile in the ED with continued vomiting, AKI, leukocytosis and ketonuria. CT showed evidence of small bowel dysmotility, but no obstruction. It also noted diffuse aortic atherosclerosis and severe infrarenal luminal narrowing. Admission was requested for dehydration, AKI due to intractable nausea and vomiting due to presumed viral gastroenteritis.  Review of Systems:She denies any suspicious ingestion, fever, sick contacts, hematemesis, hematochezia, melena. She had been eating less that day as well. She has had the leg cramping that awakens her at night for months without any directed treatment, denying claudication,and per HPI. All others reviewed and are negative.  12/22/2017 overnight patient had episodes of chest pain which was relieved after giving medications to decrease her blood pressure. Her blood pressure was elevated in spite of multiple dose of hydralazine. She also reports that after the hydralazine last 2 nights she had chest pain. The first night she had chest pain she did not tell anyone. Notes from overnight events noted. She has an elevated troponin.  12/24/2017-multiple episodes of diarrhea.kub-Unchanged small bowel dilatation compatible with high-grade partial or early small obstruction. Contrast remains within the stomach at 8 hours from the start of  imaging.      Assessment & Plan:   Principal Problem:   Viral gastroenteritis Active Problems:   AKI (acute kidney injury) (HCC)   Aortic atherosclerosis (HCC)   CAD (coronary artery disease)   HTN (hypertension)   Hypothyroidism   UTI (urinary tract infection)   Generalized abdominal pain   Nausea and vomiting   Generalized abdominal tenderness   Chest pain  1]CAD history of CABG/HTN- with chest pain overnight with elevated troponin secondary to uncontrolled blood pressure.  Patient will have to be moved to the ICU for IV labetalol.  2]Intractable nausea, vomiting, diarrhea, abdominal pain: CT with suggestion of dysmotility, no obstruction. Will treat empirically for gastroenteritis. Surgeryfollowing.n.p.o. and continue IV fluids.  KUB done today shows partial small bowel obstruction still.  AKI: SCr1.38due to dehydration.  - IVF and recheck BMP - Monitor UOP.   Aortic atherosclerosis with infrarenal luminal narrowing: Appreciate Dr. Carola RhineKane's input.  He thinks patient's ABI is showing severe PAD.  However he wants to wait and follow and see her as an outpatient once her current issues have been resolved.  CAD: s/p CABG x3 in 2008, [facility]. No chest pain, dyspnea.   HTN: We will start IV labetalol drip for better blood pressure control since patient is n.p.o.  -Asthma:  - Continue singulair, no inhalers at home, no wheezing currently  Hypothyroidism: Chronic, stable - Continue synthroid     DVT prophylaxis: heparin Code Status:full Family Communication:none Disposition Plan: tbd Consultants: gi,surgery  Procedures: none Antimicrobials: cipro,flagyl Subjective:feels very weak..   Objective: Vitals:   12/24/17 0829 12/24/17 0900 12/24/17 1000 12/24/17 1100  BP: (!) 154/31 (!) 175/41 (!) 157/38 (!) 154/36  Pulse: 66 67 67 71  Resp: 15 (!) 21 15 16   Temp:  97.6 F (36.4 C)  TempSrc:    Oral  SpO2: 96% 96% 94% 96%   Weight:      Height:        Intake/Output Summary (Last 24 hours) at 12/24/2017 1221 Last data filed at 12/24/2017 1100 Gross per 24 hour  Intake 2400.63 ml  Output 1000 ml  Net 1400.63 ml   Filed Weights   12/18/17 0651 12/24/17 0356  Weight: 63.1 kg (139 lb 1.8 oz) 63.1 kg (139 lb 1.8 oz)    Examination:  General exam: Appears calm and comfortable  Respiratory system: Clear to auscultation. Respiratory effort normal. Cardiovascular system: S1 & S2 heard, RRR. No JVD, murmurs, rubs, gallops or clicks. No pedal edema. Gastrointestinal system: Abdomen is nondistended, soft and nontender. No organomegaly or masses felt. Normal bowel sounds heard. Central nervous system: Alert and oriented. No focal neurological deficits. Extremities: Symmetric 5 x 5 power. Skin: No rashes, lesions or ulcers Psychiatry: Judgement and insight appear normal. Mood & affect appropriate.     Data Reviewed: I have personally reviewed following labs and imaging studies  CBC: Recent Labs  Lab 12/20/17 1134 12/21/17 0600 12/22/17 0408 12/23/17 0128 12/24/17 0245  WBC 7.2 6.0 6.4 7.7 5.4  HGB 12.7 11.6* 10.8* 11.5* 11.1*  HCT 38.0 33.8* 31.7* 33.0* 32.0*  MCV 90.5 90.4 90.1 89.4 88.6  PLT 252 228 207 241 237   Basic Metabolic Panel: Recent Labs  Lab 12/20/17 1134 12/21/17 0600 12/22/17 0408 12/23/17 0128 12/24/17 0245  NA 137 139 135 137 137  K 4.5 3.9 3.6 3.7 3.1*  CL 105 108 110 109 108  CO2 21* 16* 16* 17* 19*  GLUCOSE 82 74 71 89 108*  BUN 15 15 15 13 13   CREATININE 1.37* 1.38* 1.32* 1.36* 1.55*  CALCIUM 9.3 9.0 8.4* 9.3 9.0   GFR: Estimated Creatinine Clearance: 21.4 mL/min (A) (by C-G formula based on SCr of 1.55 mg/dL (H)). Liver Function Tests: Recent Labs  Lab 12/18/17 0134 12/20/17 1134 12/22/17 0408 12/24/17 0245  AST 30 26 29 29   ALT 20 16 17 22   ALKPHOS 53 43 32* 37*  BILITOT 0.5 0.9 0.6 0.8  PROT 8.6* 7.3 5.9* 6.4*  ALBUMIN 4.5 3.6 2.8* 3.1*   Recent Labs   Lab 12/18/17 0134  LIPASE 47   No results for input(s): AMMONIA in the last 168 hours. Coagulation Profile: No results for input(s): INR, PROTIME in the last 168 hours. Cardiac Enzymes: Recent Labs  Lab 12/21/17 2224 12/22/17 0408 12/22/17 0955 12/22/17 1512 12/22/17 2108  TROPONINI 0.06* 0.14* 0.11* 0.08* 0.07*   BNP (last 3 results) No results for input(s): PROBNP in the last 8760 hours. HbA1C: No results for input(s): HGBA1C in the last 72 hours. CBG: Recent Labs  Lab 12/18/17 2210  GLUCAP 99   Lipid Profile: No results for input(s): CHOL, HDL, LDLCALC, TRIG, CHOLHDL, LDLDIRECT in the last 72 hours. Thyroid Function Tests: No results for input(s): TSH, T4TOTAL, FREET4, T3FREE, THYROIDAB in the last 72 hours. Anemia Panel: No results for input(s): VITAMINB12, FOLATE, FERRITIN, TIBC, IRON, RETICCTPCT in the last 72 hours. Sepsis Labs: Recent Labs  Lab 12/20/17 1134  LATICACIDVEN 1.0    Recent Results (from the past 240 hour(s))  Gastrointestinal Panel by PCR , Stool     Status: None   Collection Time: 12/20/17  4:35 PM  Result Value Ref Range Status   Campylobacter species NOT DETECTED NOT DETECTED Final   Plesimonas shigelloides NOT DETECTED NOT DETECTED Final  Salmonella species NOT DETECTED NOT DETECTED Final   Yersinia enterocolitica NOT DETECTED NOT DETECTED Final   Vibrio species NOT DETECTED NOT DETECTED Final   Vibrio cholerae NOT DETECTED NOT DETECTED Final   Enteroaggregative E coli (EAEC) NOT DETECTED NOT DETECTED Final   Enteropathogenic E coli (EPEC) NOT DETECTED NOT DETECTED Final   Enterotoxigenic E coli (ETEC) NOT DETECTED NOT DETECTED Final   Shiga like toxin producing E coli (STEC) NOT DETECTED NOT DETECTED Final   Shigella/Enteroinvasive E coli (EIEC) NOT DETECTED NOT DETECTED Final   Cryptosporidium NOT DETECTED NOT DETECTED Final   Cyclospora cayetanensis NOT DETECTED NOT DETECTED Final   Entamoeba histolytica NOT DETECTED NOT DETECTED  Final   Giardia lamblia NOT DETECTED NOT DETECTED Final   Adenovirus F40/41 NOT DETECTED NOT DETECTED Final   Astrovirus NOT DETECTED NOT DETECTED Final   Norovirus GI/GII NOT DETECTED NOT DETECTED Final   Rotavirus A NOT DETECTED NOT DETECTED Final   Sapovirus (I, II, IV, and V) NOT DETECTED NOT DETECTED Final    Comment: Performed at [facility], [address] [place], [place] [address]  C difficile quick scan w PCR reflex     Status: None   Collection Time: 12/21/17  5:34 PM  Result Value Ref Range Status   C Diff antigen NEGATIVE NEGATIVE Final   C Diff toxin NEGATIVE NEGATIVE Final   C Diff interpretation No C. difficile detected.  Final    Comment: Performed at [facility], [address] [place], [place] [address]  MRSA PCR Screening     Status: None   Collection Time: 12/23/17  4:19 PM  Result Value Ref Range Status   MRSA by PCR NEGATIVE NEGATIVE Final    Comment:        The GeneXpert MRSA Assay (FDA approved for NASAL specimens only), is one component of a comprehensive MRSA colonization surveillance program. It is not intended to diagnose MRSA infection nor to guide or monitor treatment for MRSA infections. Performed at [facility], [address] [place], [place] [address]          Radiology Studies: Dg Abd 1 View  Result Date: 12/23/2017 CLINICAL DATA:  Abdominal pain and nausea. Recent bowel obstruction. EXAM: ABDOMEN - 1 VIEW COMPARISON:  December 22, 2017 FINDINGS: Nasogastric tube tip and side port in stomach. There are multiple loops of dilated small bowel. There is also a moderate air in the stomach. There is modest air in the colon. No appreciable air-fluid levels. No free air. There are iliac artery calcifications bilaterally. Lung bases are clear. IMPRESSION: Findings most consistent with a degree of persistent bowel obstruction, similar to 1 day prior. No free air. Nasogastric tube tip and side port  in stomach. There is iliac artery atherosclerotic calcification bilaterally. Electronically Signed   By: Bretta Bang III M.D.   On: 12/23/2017 12:27   Dg Abd Portable 1v-small Bowel Obstruction Protocol-initial, 8 Hr Delay  Result Date: 12/24/2017 CLINICAL DATA:  Small-bowel obstruction, 8 hour delayed image. EXAM: PORTABLE ABDOMEN - 1 VIEW COMPARISON:  12/23/2017 at 1159 hours FINDINGS: Enteric contrast is seen within the stomach with gastric tube in place. Persistent unchanged dilatation of small bowel loops compatible small bowel obstruction is identified. Flocculent contrast within the stomach is noted without antegrade progression. IMPRESSION: Unchanged small bowel dilatation compatible with high-grade partial or early small obstruction. Contrast remains within the stomach at 8 hours from the start of imaging. Electronically Signed   By:  Tollie Eth M.D.   On: 12/24/2017 01:59        Scheduled Meds: . cloNIDine  0.2 mg Transdermal Weekly   Continuous Infusions: . sodium chloride 75 mL/hr at 12/24/17 0500  . heparin 500 Units/hr (12/24/17 0500)  . labetalol (NORMODYNE) infusion 1.5 mg/min (12/24/17 1134)     LOS: 5 days      Alwyn Ren, MD [facility] If 7PM-7AM, please contact night-coverage [email or web address] Password [facility] 12/24/2017, 12:21 PM

## 2017-12-25 ENCOUNTER — Inpatient Hospital Stay (HOSPITAL_COMMUNITY): Payer: Medicare HMO

## 2017-12-25 LAB — BASIC METABOLIC PANEL
Anion gap: 9 (ref 5–15)
BUN: 11 mg/dL (ref 6–20)
CALCIUM: 9.2 mg/dL (ref 8.9–10.3)
CHLORIDE: 109 mmol/L (ref 101–111)
CO2: 21 mmol/L — ABNORMAL LOW (ref 22–32)
CREATININE: 1.45 mg/dL — AB (ref 0.44–1.00)
GFR calc Af Amer: 38 mL/min — ABNORMAL LOW (ref >60)
GFR calc non Af Amer: 33 mL/min — ABNORMAL LOW (ref >60)
Glucose, Bld: 111 mg/dL — ABNORMAL HIGH (ref 65–99)
Potassium: 3.3 mmol/L — ABNORMAL LOW (ref 3.5–5.1)
SODIUM: 139 mmol/L (ref 135–145)

## 2017-12-25 LAB — CBC
HEMATOCRIT: 31 % — AB (ref 36.0–46.0)
Hemoglobin: 10.7 g/dL — ABNORMAL LOW (ref 12.0–15.0)
MCH: 30.8 pg (ref 26.0–34.0)
MCHC: 34.5 g/dL (ref 30.0–36.0)
MCV: 89.3 fL (ref 78.0–100.0)
PLATELETS: 178 10*3/uL (ref 150–400)
RBC: 3.47 MIL/uL — ABNORMAL LOW (ref 3.87–5.11)
RDW: 13.9 % (ref 11.5–15.5)
WBC: 7.1 10*3/uL (ref 4.0–10.5)

## 2017-12-25 LAB — HEPARIN LEVEL (UNFRACTIONATED)
HEPARIN UNFRACTIONATED: 0.18 [IU]/mL — AB (ref 0.30–0.70)
Heparin Unfractionated: 0.23 IU/mL — ABNORMAL LOW (ref 0.30–0.70)

## 2017-12-25 MED ORDER — HEPARIN (PORCINE) IN NACL 100-0.45 UNIT/ML-% IJ SOLN
700.0000 [IU]/h | INTRAMUSCULAR | Status: DC
  Administered 2017-12-26: 700 [IU]/h via INTRAVENOUS
  Filled 2017-12-25: qty 250

## 2017-12-25 MED ORDER — MORPHINE SULFATE (PF) 4 MG/ML IV SOLN
2.0000 mg | Freq: Once | INTRAVENOUS | Status: AC
  Administered 2017-12-25: 2 mg via INTRAVENOUS
  Filled 2017-12-25: qty 1

## 2017-12-25 MED ORDER — ORAL CARE MOUTH RINSE
15.0000 mL | Freq: Two times a day (BID) | OROMUCOSAL | Status: DC
  Administered 2017-12-25 – 2017-12-29 (×7): 15 mL via OROMUCOSAL

## 2017-12-25 NOTE — Progress Notes (Signed)
ANTICOAGULATION CONSULT NOTE - Follow Up Consult  Pharmacy Consult for heparin  Indication: r/o ACS  Allergies  Allergen Reactions  . Clams [Shellfish Allergy] Anaphylaxis  . Hydralazine Other (See Comments)    Possible cause of chest discomfort  . Red Dye Other (See Comments)    Constipation     Patient Measurements: Height: 4\' 9"  (144.8 cm) Weight: 132 lb 0.9 oz (59.9 kg) IBW/kg (Calculated) : 38.6 Heparin Dosing Weight: 53 kg  Vital Signs: Temp: 98.2 F (36.8 C) (02/17 0302) Temp Source: Axillary (02/17 0302) BP: 162/41 (02/17 0400) Pulse Rate: 64 (02/17 0400)  Labs: Recent Labs    12/22/17 0955  12/22/17 1512 12/22/17 2108  12/23/17 0128  12/23/17 2047 12/24/17 0245 12/24/17 0739 12/25/17 0330  HGB  --   --   --   --    < > 11.5*  --   --  11.1*  --  10.7*  HCT  --   --   --   --   --  33.0*  --   --  32.0*  --  31.0*  PLT  --   --   --   --   --  241  --   --  237  --  178  HEPARINUNFRC  --    < > 1.04*  --   --  0.84*   < > 0.39  --  0.32 0.23*  CREATININE  --   --   --   --   --  1.36*  --   --  1.55*  --  1.45*  TROPONINI 0.11*  --  0.08* 0.07*  --   --   --   --   --   --   --    < > = values in this interval not displayed.    Estimated Creatinine Clearance: 22.2 mL/min (A) (by C-G formula based on SCr of 1.45 mg/dL (H)).   Assessment: Patient's an 84 y.o F with hx CAD s/p CABG in 2008 and HTN presented to the ED on 12/18/17 with c/o leg cramps, n/v and abdominal pain.  Heparin drip started on 12/22/17 for elevated troponin.  Abd X-ray showed findings consistent with "a degree of persistent bowel obstruction." Cardiology team recommends to continue with heparin drip for now and to proceed with stress test when GI issues have resolved.  Today, 12/25/2017: - Heparin level has drifted down and is now SUBtherapeutic on 500 units/hr - Hgb and platelets have been trending down - Last night RN noted coffee ground looking material in the NG canister and blood in  sputum. Per RN this AM no further blood in sputum. Surgery, instructed to clamp NG and monitor for nausea.     Goal of Therapy:  Heparin level 0.3-0.7 units/ml Monitor platelets by anticoagulation protocol: Yes   Plan:  - Increase heparin infusion conservatively to 550 units/hr - Heparin level in 6 hours - Check heparin level daily and CBC q48h minimum - monitor for s/s bleeding  Charolotte Ekeom Lynnlee Revels, PharmD. Mobile: [phone]. 12/25/2017,7:02 AM.

## 2017-12-25 NOTE — Progress Notes (Signed)
PROGRESS NOTE    Katie [name]  XBM:[id number] DOB: Jul 01, 1933 DOA: 12/18/2017 PCP: Macy Mis, MD Brief Gerarda Fraction y.o.femalewitha history of CAD s/p CABG x3 2008, partial colectomy for invasive polyp and take back for postop infection remotely, HTN, hypothyroidism, HLD, and COPD who presented to the ED after she awoke with leg cramping and noted significant nausea, vomiting and generalized abdominal pain since last evening. This has happened sporadically for a few weeks, but was more severe than previously and associated with loose stools and abdominal bloating. She was hypertensive and afebrile in the ED with continued vomiting, AKI, leukocytosis and ketonuria. CT showed evidence of small bowel dysmotility, but no obstruction. It also noted diffuse aortic atherosclerosis and severe infrarenal luminal narrowing. Admission was requested for dehydration, AKI due to intractable nausea and vomiting due to presumed viral gastroenteritis.  12/24/2017-multiple episodes of diarrhea.kub-Unchanged small bowel dilatation compatible with high-grade partial or early small obstruction. Contrast remains within the stomach at 8 hours from the start of imaging.  Assessment & Plan:   Principal Problem:   Viral gastroenteritis Active Problems:   AKI (acute kidney injury) (HCC)   Aortic atherosclerosis (HCC)   CAD (coronary artery disease)   HTN (hypertension)   Hypothyroidism   UTI (urinary tract infection)   Generalized abdominal pain   Nausea and vomiting   Generalized abdominal tenderness   Chest pain  1]CAD history of CABG/HTN- Pt had chest pain with elevated troponin secondary to uncontrolled blood pressure.  Patient moved to the ICU for IV labetalol.  2) Intractable nausea, vomiting, diarrhea, abdominal pain: CT with suggestion of dysmotility, no obstruction. Will treat empirically for gastroenteritis.  - Surgery on board and patient is .n.p.o. and managing ng tube.  Plan is to remove  if patient has no N or emesis after ng clamping.  AKI: SCr1.38due to dehydration.  - IVF and recheck BMP - Monitor UOP.   Aortic atherosclerosis with infrarenal luminal narrowing: Appreciate Dr. Carola Rhine input.  He thinks patient's ABI is showing severe PAD.  However he wants to wait and follow and see her as an outpatient once her current issues have been resolved.  CAD: s/p CABG x3 in 2008, followed by cardiology at [facility]. No chest pain, dyspnea.   HTN: We will start IV labetalol drip for better blood pressure control since patient is n.p.o.  -Asthma:  - Continue singulair, no inhalers at home, no wheezing currently  Hypothyroidism: Chronic, stable - Continue synthroid   DVT prophylaxis: heparin Code Status:full Family Communication: none Disposition Plan: tbd Consultants: gi,surgery  Procedures: none Antimicrobials: Flagyl, Cipro  Subjective:  Pt has no new complaints  Objective: Vitals:   12/25/17 0855 12/25/17 0900 12/25/17 0936 12/25/17 1000  BP: (!) 152/37 (!) 146/41 (!) 140/37 (!) 165/41  Pulse: 66 66 67 68  Resp: 18 15 13 13   Temp:      TempSrc:      SpO2: 92% 92% 91% 93%  Weight:      Height:        Intake/Output Summary (Last 24 hours) at 12/25/2017 1212 Last data filed at 12/25/2017 1000 Gross per 24 hour  Intake 2670.07 ml  Output 1450 ml  Net 1220.07 ml   Filed Weights   12/18/17 0651 12/24/17 0356 12/24/17 2337  Weight: 63.1 kg (139 lb 1.8 oz) 63.1 kg (139 lb 1.8 oz) 59.9 kg (132 lb 0.9 oz)    Examination:  General exam: Appears calm and comfortable, in nad. Respiratory system: Clear to auscultation. Respiratory  effort normal. Equal chest rise. Cardiovascular system: S1 & S2 heard, RRR. No JVD, murmurs, rubs Gastrointestinal system: Abdomen is nondistended, soft and nontender. No organomegaly or masses felt. Normal bowel sounds heard. Central nervous system: Alert and oriented. No focal neurological deficits. Extremities: Symmetric  5 x 5 power. Skin: No rashes, lesions or ulcers, on limited exam. Psychiatry: Mood & affect appropriate.     Data Reviewed: I have personally reviewed following labs and imaging studies  CBC: Recent Labs  Lab 12/21/17 0600 12/22/17 0408 12/23/17 0128 12/24/17 0245 12/25/17 0330  WBC 6.0 6.4 7.7 5.4 7.1  HGB 11.6* 10.8* 11.5* 11.1* 10.7*  HCT 33.8* 31.7* 33.0* 32.0* 31.0*  MCV 90.4 90.1 89.4 88.6 89.3  PLT 228 207 241 237 178   Basic Metabolic Panel: Recent Labs  Lab 12/21/17 0600 12/22/17 0408 12/23/17 0128 12/24/17 0245 12/25/17 0330  NA 139 135 137 137 139  K 3.9 3.6 3.7 3.1* 3.3*  CL 108 110 109 108 109  CO2 16* 16* 17* 19* 21*  GLUCOSE 74 71 89 108* 111*  BUN 15 15 13 13 11   CREATININE 1.38* 1.32* 1.36* 1.55* 1.45*  CALCIUM 9.0 8.4* 9.3 9.0 9.2   GFR: Estimated Creatinine Clearance: 22.2 mL/min (A) (by C-G formula based on SCr of 1.45 mg/dL (H)). Liver Function Tests: Recent Labs  Lab 12/20/17 1134 12/22/17 0408 12/24/17 0245  AST 26 29 29   ALT 16 17 22   ALKPHOS 43 32* 37*  BILITOT 0.9 0.6 0.8  PROT 7.3 5.9* 6.4*  ALBUMIN 3.6 2.8* 3.1*   No results for input(s): LIPASE, AMYLASE in the last 168 hours. No results for input(s): AMMONIA in the last 168 hours. Coagulation Profile: No results for input(s): INR, PROTIME in the last 168 hours. Cardiac Enzymes: Recent Labs  Lab 12/21/17 2224 12/22/17 0408 12/22/17 0955 12/22/17 1512 12/22/17 2108  TROPONINI 0.06* 0.14* 0.11* 0.08* 0.07*   BNP (last 3 results) No results for input(s): PROBNP in the last 8760 hours. HbA1C: No results for input(s): HGBA1C in the last 72 hours. CBG: Recent Labs  Lab 12/18/17 2210  GLUCAP 99   Lipid Profile: No results for input(s): CHOL, HDL, LDLCALC, TRIG, CHOLHDL, LDLDIRECT in the last 72 hours. Thyroid Function Tests: No results for input(s): TSH, T4TOTAL, FREET4, T3FREE, THYROIDAB in the last 72 hours. Anemia Panel: No results for input(s): VITAMINB12,  FOLATE, FERRITIN, TIBC, IRON, RETICCTPCT in the last 72 hours. Sepsis Labs: Recent Labs  Lab 12/20/17 1134  LATICACIDVEN 1.0    Recent Results (from the past 240 hour(s))  Gastrointestinal Panel by PCR , Stool     Status: None   Collection Time: 12/20/17  4:35 PM  Result Value Ref Range Status   Campylobacter species NOT DETECTED NOT DETECTED Final   Plesimonas shigelloides NOT DETECTED NOT DETECTED Final   Salmonella species NOT DETECTED NOT DETECTED Final   Yersinia enterocolitica NOT DETECTED NOT DETECTED Final   Vibrio species NOT DETECTED NOT DETECTED Final   Vibrio cholerae NOT DETECTED NOT DETECTED Final   Enteroaggregative E coli (EAEC) NOT DETECTED NOT DETECTED Final   Enteropathogenic E coli (EPEC) NOT DETECTED NOT DETECTED Final   Enterotoxigenic E coli (ETEC) NOT DETECTED NOT DETECTED Final   Shiga like toxin producing E coli (STEC) NOT DETECTED NOT DETECTED Final   Shigella/Enteroinvasive E coli (EIEC) NOT DETECTED NOT DETECTED Final   Cryptosporidium NOT DETECTED NOT DETECTED Final   Cyclospora cayetanensis NOT DETECTED NOT DETECTED Final   Entamoeba histolytica NOT DETECTED  NOT DETECTED Final   Giardia lamblia NOT DETECTED NOT DETECTED Final   Adenovirus F40/41 NOT DETECTED NOT DETECTED Final   Astrovirus NOT DETECTED NOT DETECTED Final   Norovirus GI/GII NOT DETECTED NOT DETECTED Final   Rotavirus A NOT DETECTED NOT DETECTED Final   Sapovirus (I, II, IV, and V) NOT DETECTED NOT DETECTED Final    Comment: Performed at [facility], [address] [place], [place] [address]  C difficile quick scan w PCR reflex     Status: None   Collection Time: 12/21/17  5:34 PM  Result Value Ref Range Status   C Diff antigen NEGATIVE NEGATIVE Final   C Diff toxin NEGATIVE NEGATIVE Final   C Diff interpretation No C. difficile detected.  Final    Comment: Performed at [facility], [address] [place], [place] [address]  MRSA PCR Screening      Status: None   Collection Time: 12/23/17  4:19 PM  Result Value Ref Range Status   MRSA by PCR NEGATIVE NEGATIVE Final    Comment:        The GeneXpert MRSA Assay (FDA approved for NASAL specimens only), is one component of a comprehensive MRSA colonization surveillance program. It is not intended to diagnose MRSA infection nor to guide or monitor treatment for MRSA infections. Performed at [facility], [address] [place], [place] [address]          Radiology Studies: Dg Abd 1 View  Result Date: 12/25/2017 CLINICAL DATA:  Assessment for small bowel obstruction. 24 hour delayed film. EXAM: ABDOMEN - 1 VIEW COMPARISON:  Radiograph yesterday at 0133 hour FINDINGS: Administered enteric contrast has dissipated and is seen primarily throughout small bowel. Patient has had ileal colonic anastomosis in the transverse colon, and enteric contrast does appear to reach the splenic flexure. Persistent gaseous small bowel distention in the central abdomen. IMPRESSION: Probable administered enteric contrast in the splenic flexure of the colon. Additional enteric contrast has dissipated and is seen in throughout small bowel. Electronically Signed   By: Rubye OaksMelanie  Ehinger M.D.   On: 12/25/2017 01:59   Dg Abd 1 View  Result Date: 12/23/2017 CLINICAL DATA:  Abdominal pain and nausea. Recent bowel obstruction. EXAM: ABDOMEN - 1 VIEW COMPARISON:  December 22, 2017 FINDINGS: Nasogastric tube tip and side port in stomach. There are multiple loops of dilated small bowel. There is also a moderate air in the stomach. There is modest air in the colon. No appreciable air-fluid levels. No free air. There are iliac artery calcifications bilaterally. Lung bases are clear. IMPRESSION: Findings most consistent with a degree of persistent bowel obstruction, similar to 1 day prior. No free air. Nasogastric tube tip and side port in stomach. There is iliac artery atherosclerotic calcification  bilaterally. Electronically Signed   By: Bretta BangWilliam  Woodruff III M.D.   On: 12/23/2017 12:27   Dg Abd Portable 1v-small Bowel Obstruction Protocol-initial, 8 Hr Delay  Result Date: 12/24/2017 CLINICAL DATA:  Small-bowel obstruction, 8 hour delayed image. EXAM: PORTABLE ABDOMEN - 1 VIEW COMPARISON:  12/23/2017 at 1159 hours FINDINGS: Enteric contrast is seen within the stomach with gastric tube in place. Persistent unchanged dilatation of small bowel loops compatible small bowel obstruction is identified. Flocculent contrast within the stomach is noted without antegrade progression. IMPRESSION: Unchanged small bowel dilatation compatible with high-grade partial or early small obstruction. Contrast remains within the stomach at 8 hours from the start of imaging. Electronically Signed   By: Onalee Huaavid  Sterling Big M.D.   On: 12/24/2017 01:59    Scheduled Meds: . cloNIDine  0.2 mg Transdermal Weekly  . mouth rinse  15 mL Mouth Rinse BID   Continuous Infusions: . sodium chloride 75 mL/hr at 12/25/17 0400  . ciprofloxacin Stopped (12/25/17 0352)  . heparin 550 Units/hr (12/25/17 0837)  . labetalol (NORMODYNE) infusion 1 mg/min (12/25/17 1102)  . metronidazole Stopped (12/25/17 0721)     LOS: 6 days   Penny Pia, MD [facility] If 7PM-7AM, please contact night-coverage [email or web address] Password [email or web address] 12/25/2017, 12:12 PM

## 2017-12-25 NOTE — Progress Notes (Signed)
ANTICOAGULATION CONSULT NOTE - Follow Up Consult  Pharmacy Consult for heparin  Indication: r/o ACS  Allergies  Allergen Reactions  . Clams [Shellfish Allergy] Anaphylaxis  . Hydralazine Other (See Comments)    Possible cause of chest discomfort  . Red Dye Other (See Comments)    Constipation     Patient Measurements: Height: 4\' 9"  (144.8 cm) Weight: 132 lb 0.9 oz (59.9 kg) IBW/kg (Calculated) : 38.6 Heparin Dosing Weight: 53 kg  Vital Signs: Temp: 97.4 F (36.3 C) (02/17 1200) Temp Source: Oral (02/17 1200) BP: 191/43 (02/17 1700) Pulse Rate: 64 (02/17 1700)  Labs: Recent Labs    12/22/17 2108  12/23/17 0128  12/24/17 0245 12/24/17 0739 12/25/17 0330 12/25/17 1530  HGB  --    < > 11.5*  --  11.1*  --  10.7*  --   HCT  --   --  33.0*  --  32.0*  --  31.0*  --   PLT  --   --  241  --  237  --  178  --   HEPARINUNFRC  --   --  0.84*   < >  --  0.32 0.23* 0.18*  CREATININE  --   --  1.36*  --  1.55*  --  1.45*  --   TROPONINI 0.07*  --   --   --   --   --   --   --    < > = values in this interval not displayed.    Estimated Creatinine Clearance: 22.2 mL/min (A) (by C-G formula based on SCr of 1.45 mg/dL (H)).   Assessment: Patient's an 84 y.o F with hx CAD s/p CABG in 2008 and HTN presented to the ED on 12/18/17 with c/o leg cramps, n/v and abdominal pain.  Heparin drip started on 12/22/17 for elevated troponin.  Abd X-ray showed findings consistent with "a degree of persistent bowel obstruction." Cardiology team recommends to continue with heparin drip for now and to proceed with stress test when GI issues have resolved.  Today, 12/25/2017: - Heparin level SUBtherapeutic and trending down despite rate increase this am to 550 units/hr   RN reports no issues with infusion or bleeding (NGT removed).  Infusing at expected rate.  IV site appears OK.   - Hgb and platelets have been trending down - Last night RN noted coffee ground looking material in the NG canister and  blood in sputum. Per RN this AM no further blood in sputum. Surgery, instructed to clamp NG and monitor for nausea.     Goal of Therapy:  Heparin level 0.3-0.7 units/ml Monitor platelets by anticoagulation protocol: Yes   Plan:  - Increase heparin infusion to 700 units/hr - Heparin level in 8 hours - Check heparin level daily and CBC q48h minimum - monitor for s/s bleeding  Juliette Alcideustin Zeigler, PharmD, BCPS.   Pager: [phone] 12/25/2017 5:29 PM

## 2017-12-25 NOTE — Progress Notes (Addendum)
When changing Pt canister and tubing for NG, coffee ground appearance was noticed in old canister and tubing. Pt also had some nausea and has had blood in her sputum when she deep coughs which was first noticed the night of 2/16. Provider on call notified. Will continue to monitor.

## 2017-12-25 NOTE — Progress Notes (Signed)
CC: Possible SBO  Subjective: NG with gastric output ~1.5L, denies n/v. Having BMs, passing flatus. Denies abdominal pain and thinks continues to improve.  Objective: Vital signs in last 24 hours: Temp:  [97.5 F (36.4 C)-98.2 F (36.8 C)] 97.6 F (36.4 C) (02/17 0720) Pulse Rate:  [60-71] 64 (02/17 0400) Resp:  [11-24] 15 (02/17 0400) BP: (133-197)/(29-75) 162/41 (02/17 0400) SpO2:  [94 %-98 %] 94 % (02/17 0400) Weight:  [59.9 kg (132 lb 0.9 oz)] 59.9 kg (132 lb 0.9 oz) (02/16 2337) Last BM Date: 12/24/17 P.o. for 80 IV 1593 Voided x8 NG Tube: 1.5L BM x6 Afebrile vital signs are normal On heparin gtt per cardiology for possible ACS Cr up trending - 1.45 from 1.55 WBC remains normal.   Intake/Output from previous day: 02/16 0701 - 02/17 0700 In: 3025.8 [I.V.:2335.8; NG/GT:90; IV Piggyback:600] Out: 1450 [Emesis/NG output:1450] Intake/Output this shift: No intake/output data recorded.  General appearance: alert, cooperative, no distress and . Resp: clear to auscultation bilaterally GI: soft, nontender, mildly distended  Lab Results:  Recent Labs    12/24/17 0245 12/25/17 0330  WBC 5.4 7.1  HGB 11.1* 10.7*  HCT 32.0* 31.0*  PLT 237 178    BMET Recent Labs    12/24/17 0245 12/25/17 0330  NA 137 139  K 3.1* 3.3*  CL 108 109  CO2 19* 21*  GLUCOSE 108* 111*  BUN 13 11  CREATININE 1.55* 1.45*  CALCIUM 9.0 9.2   PT/INR No results for input(s): LABPROT, INR in the last 72 hours.  Recent Labs  Lab 12/20/17 1134 12/22/17 0408 12/24/17 0245  AST 26 29 29   ALT 16 17 22   ALKPHOS 43 32* 37*  BILITOT 0.9 0.6 0.8  PROT 7.3 5.9* 6.4*  ALBUMIN 3.6 2.8* 3.1*     Lipase     Component Value Date/Time   LIPASE 47 12/18/2017 0134     Medications: . cloNIDine  0.2 mg Transdermal Weekly    Assessment/Plan Possible SBO vs gastroenteritis Abdominal pain, cramps and diarrhea times 3 months. New-onset nausea and vomiting  New chest Pain with mild  troponin elevation - cardiology following, on heparin gtt History of polyps/polypectomy with colon perforation/partial colectomy CAD with history of MI/prior stents/[place]AB[place] x3 in [place] -[place] cardiologist is in [place]  Peripheral vascular occlusive disease with ABIs 0.55 on the right, 0.26 on the left - vascular evaluated; CTA showed no significant SMA occlusion History of tobacco use/asthma Hypertension Hypothyroid  FEN: IV fluids/NPO/NG tube today ID: none DVT: Heparin drip Foley: None Follow up: TBD  Plan: Having BMs; Abd XR showed contrast progressing into colon. Will clamp NG tube; if no n/v after 6hrs, plan to remove (~2pm). Continue IVF today. Will likely need PICC/TPN tomorrow if not progressing rapidly with regards to diet.   LOS: 6 days   Stephanie CoupChristopher M [facility] 12/25/2017

## 2017-12-25 NOTE — Plan of Care (Signed)
  Clinical Measurements: Ability to maintain clinical measurements within normal limits will improve 12/25/2017 1009 - Progressing by Charlett LangoKotian, Tenia Goh S, RN Will remain free from infection 12/25/2017 1009 - Progressing by Charlett LangoKotian, Eleanor Dimichele S, RN Diagnostic test results will improve 12/25/2017 1009 - Progressing by Charlett LangoKotian, Nava Song S, RN   Nutrition: Adequate nutrition will be maintained 12/25/2017 1009 - Not Progressing by Charlett LangoKotian, Zacary Bauer S, RN   Elimination: Will not experience complications related to bowel motility 12/25/2017 1009 - Progressing by Charlett LangoKotian, Caress Reffitt S, RN   Pain Managment: General experience of comfort will improve 12/25/2017 1009 - Progressing by Charlett LangoKotian, Sahid Borba S, RN

## 2017-12-26 ENCOUNTER — Telehealth: Payer: Self-pay | Admitting: Vascular Surgery

## 2017-12-26 LAB — CBC
HCT: 31 % — ABNORMAL LOW (ref 36.0–46.0)
Hemoglobin: 10.5 g/dL — ABNORMAL LOW (ref 12.0–15.0)
MCH: 30.4 pg (ref 26.0–34.0)
MCHC: 33.9 g/dL (ref 30.0–36.0)
MCV: 89.9 fL (ref 78.0–100.0)
PLATELETS: 247 10*3/uL (ref 150–400)
RBC: 3.45 MIL/uL — AB (ref 3.87–5.11)
RDW: 14 % (ref 11.5–15.5)
WBC: 10.4 10*3/uL (ref 4.0–10.5)

## 2017-12-26 LAB — HEPARIN LEVEL (UNFRACTIONATED)
Heparin Unfractionated: 0.48 IU/mL (ref 0.30–0.70)
Heparin Unfractionated: 0.58 IU/mL (ref 0.30–0.70)

## 2017-12-26 MED ORDER — ISOSORB DINITRATE-HYDRALAZINE 20-37.5 MG PO TABS
1.0000 | ORAL_TABLET | Freq: Three times a day (TID) | ORAL | Status: DC
  Administered 2017-12-26 – 2017-12-29 (×11): 1 via ORAL
  Filled 2017-12-26 (×13): qty 1

## 2017-12-26 MED ORDER — ENSURE ENLIVE PO LIQD
237.0000 mL | Freq: Two times a day (BID) | ORAL | Status: DC
  Administered 2017-12-26 – 2017-12-28 (×4): 237 mL via ORAL

## 2017-12-26 MED ORDER — DILTIAZEM HCL ER COATED BEADS 240 MG PO CP24
240.0000 mg | ORAL_CAPSULE | Freq: Every day | ORAL | Status: DC
  Administered 2017-12-26 – 2017-12-29 (×4): 240 mg via ORAL
  Filled 2017-12-26 (×2): qty 1
  Filled 2017-12-26 (×2): qty 2

## 2017-12-26 NOTE — Telephone Encounter (Signed)
-----   Message from Sharee PimpleMarilyn K McChesney, RN sent at 12/23/2017 10:00 PM EST ----- Regarding: 3-6 weeks to discuss angiogram   ----- Message ----- From: Maeola Harmanain, Brandon Christopher, MD Sent: 12/23/2017   5:04 PM To: [address]  Lance MorinMary Agnes [place] [id number] 1933-07-17  F/u in 3-6 weeks to discuss angiogram

## 2017-12-26 NOTE — Progress Notes (Signed)
ANTICOAGULATION CONSULT NOTE - Follow Up Consult  Pharmacy Consult for heparin  Indication: r/o ACS  Allergies  Allergen Reactions  . Clams [Shellfish Allergy] Anaphylaxis  . Hydralazine Other (See Comments)    Possible cause of chest discomfort  . Red Dye Other (See Comments)    Constipation     Patient Measurements: Height: 4\' 9"  (144.8 cm) Weight: 132 lb 0.9 oz (59.9 kg) IBW/kg (Calculated) : 38.6 Heparin Dosing Weight: 53 kg  Vital Signs: Temp: 97.2 F (36.2 C) (02/18 1200) Temp Source: Oral (02/18 1200) BP: 103/51 (02/18 1200) Pulse Rate: 61 (02/18 1200)  Labs: Recent Labs    12/24/17 0245  12/25/17 0330 12/25/17 1530 12/26/17 0136 12/26/17 0945  HGB 11.1*  --  10.7*  --  10.5*  --   HCT 32.0*  --  31.0*  --  31.0*  --   PLT 237  --  178  --  247  --   HEPARINUNFRC  --    < > 0.23* 0.18* 0.48 0.58  CREATININE 1.55*  --  1.45*  --   --   --    < > = values in this interval not displayed.    Estimated Creatinine Clearance: 22.2 mL/min (A) (by C-G formula based on SCr of 1.45 mg/dL (H)).   Assessment: Patient's an 84 y.o F with hx CAD s/p CABG in 2008 and HTN presented to the ED on 12/18/17 with c/o leg cramps, n/v and abdominal pain.  Heparin drip started on 12/22/17 for elevated troponin.  Abd X-ray showed findings consistent with "a degree of persistent bowel obstruction." Cardiology team recommends to continue with heparin drip for now and to proceed with stress test when GI issues have resolved.  Today, 12/26/2017:  -Confirmatory heparin level is therapeutic at 0.58  RN reports no issues with infusion or bleeding (NGT removed).  Infusing at expected rate.  IV site appears OK.    Hgb and platelets have been stable   Goal of Therapy:  Heparin level 0.3-0.7 units/ml Monitor platelets by anticoagulation protocol: Yes   Plan:  - Continue heparin infusion to 700 units/hr - Check heparin level daily and CBC q48h minimum - monitor for s/s  bleeding  Adalberto ColeNikola Janara Klett, PharmD, BCPS Pager [phone] 12/26/2017 2:23 PM

## 2017-12-26 NOTE — Progress Notes (Signed)
Please keep patient NPO tomorrow, even after the stress test. If stress test significantly abnormal, will possible perform heart [facility] tomorrow at [facility], subject to cath lab availability.  Katie NegusManish J Sarayu Prevost, MD [facility] Pager: [phone] Office: [phone] If no answer Cell [phone]

## 2017-12-26 NOTE — Progress Notes (Addendum)
PROGRESS NOTE    Katie [name]  ZOX:[id number] DOB: 09-10-1933 DOA: 12/18/2017 PCP: Macy Mis, MD Brief Gerarda Fraction y.o.femalewitha history of CAD s/p CABG x3 2008, partial colectomy for invasive polyp and take back for postop infection remotely, HTN, hypothyroidism, HLD, and COPD who presented to the ED after she awoke with leg cramping and noted significant nausea, vomiting and generalized abdominal pain since last evening. This has happened sporadically for a few weeks, but was more severe than previously and associated with loose stools and abdominal bloating. She was hypertensive and afebrile in the ED with continued vomiting, AKI, leukocytosis and ketonuria. CT showed evidence of small bowel dysmotility, but no obstruction. It also noted diffuse aortic atherosclerosis and severe infrarenal luminal narrowing. Admission was requested for dehydration, AKI due to intractable nausea and vomiting due to presumed viral gastroenteritis.  12/24/2017-multiple episodes of diarrhea.kub-Unchanged small bowel dilatation compatible with high-grade partial or early small obstruction.   2/18 pt taking some clears Gen surgery signing off  Assessment & Plan:   Principal Problem:   Viral gastroenteritis Active Problems:   AKI (acute kidney injury) (HCC)   Aortic atherosclerosis (HCC)   CAD (coronary artery disease)   HTN (hypertension)   Hypothyroidism   UTI (urinary tract infection)   Generalized abdominal pain   Nausea and vomiting   Generalized abdominal tenderness   Chest pain  1)CAD history of CABG/HTN- Pt's blood pressure improved and patient taking orals. Will discontinue propranolol and place back on cardizem. Pending blood pressures may add other blood pressure medication.  2) Intractable nausea, vomiting, diarrhea, abdominal pain: CT with suggestion of dysmotility, no obstruction. Will treat empirically for gastroenteritis.  - Surgery assisted, has signed off as patient does not  have SBO and is tolerating clear liquid diet.   AKI: suspected elevation in S creatinine secondary to dehydration - improving bun/creatinine ratio is down.  Aortic atherosclerosis with infrarenal luminal narrowing: Appreciate Dr. Carola Rhine input.  He thinks patient's ABI is showing severe PAD.  However he wants to wait and follow and see her as an outpatient once her current issues have been resolved.   CAD: s/p CABG x3 in 2008, followed by cardiology at [facility]. Pt does not complain of any chest pain. Troponin shows flat trend and decreasing on last check.  Addendum: discussed with cardiology who recommends further evaluation with stress test prior to discharging. If patient continues to consistently improve with abdominal issues plan will be for stress test next am 2/19  HTN: start to transition to oral medication regimen given improvement in condition.  -Asthma:  - Stable continue current regimen.  Hypothyroidism: Chronic, stable - Stable on synthroid   DVT prophylaxis: heparin Code Status:full Family Communication: none Disposition Plan: tbd Consultants: gi,surgery  Procedures: none Antimicrobials: Flagyl, Cipro  Subjective: No new complaints reported.  Objective: Vitals:   12/26/17 0300 12/26/17 0400 12/26/17 0500 12/26/17 0700  BP: (!) 159/25  (!) 146/29 (!) 155/30  Pulse: (!) 58  (!) 59 (!) 59  Resp: 18  16 17   Temp:  98 F (36.7 C)    TempSrc:  Oral    SpO2: 93%  94% 92%  Weight:      Height:        Intake/Output Summary (Last 24 hours) at 12/26/2017 0823 Last data filed at 12/26/2017 0700 Gross per 24 hour  Intake 3046.82 ml  Output 525 ml  Net 2521.82 ml   Filed Weights   12/18/17 0651 12/24/17 0356 12/24/17 2337  Weight: 63.1  kg (139 lb 1.8 oz) 63.1 kg (139 lb 1.8 oz) 59.9 kg (132 lb 0.9 oz)    Examination:  General exam: Pt in nad, Appears calm and comfortable Respiratory system:  Respiratory effort normal. Equal chest rise. No wheezes or  rhales Cardiovascular system: S1 & S2 heard, RRR. No JVD, murmurs, rubs Gastrointestinal system: Abdomen is nondistended, soft and nontender. No organomegaly or masses felt. Normal bowel sounds heard. Ng tube removed. Central nervous system: Alert and oriented. No focal neurological deficits. Extremities: Symmetric 5 x 5 power. Skin: No rashes, lesions or ulcers, on limited exam. Psychiatry: Mood & affect appropriate.     Data Reviewed: I have personally reviewed following labs and imaging studies  CBC: Recent Labs  Lab 12/22/17 0408 12/23/17 0128 12/24/17 0245 12/25/17 0330 12/26/17 0136  WBC 6.4 7.7 5.4 7.1 10.4  HGB 10.8* 11.5* 11.1* 10.7* 10.5*  HCT 31.7* 33.0* 32.0* 31.0* 31.0*  MCV 90.1 89.4 88.6 89.3 89.9  PLT 207 241 237 178 247   Basic Metabolic Panel: Recent Labs  Lab 12/21/17 0600 12/22/17 0408 12/23/17 0128 12/24/17 0245 12/25/17 0330  NA 139 135 137 137 139  K 3.9 3.6 3.7 3.1* 3.3*  CL 108 110 109 108 109  CO2 16* 16* 17* 19* 21*  GLUCOSE 74 71 89 108* 111*  BUN 15 15 13 13 11   CREATININE 1.38* 1.32* 1.36* 1.55* 1.45*  CALCIUM 9.0 8.4* 9.3 9.0 9.2   GFR: Estimated Creatinine Clearance: 22.2 mL/min (A) (by C-G formula based on SCr of 1.45 mg/dL (H)). Liver Function Tests: Recent Labs  Lab 12/20/17 1134 12/22/17 0408 12/24/17 0245  AST 26 29 29   ALT 16 17 22   ALKPHOS 43 32* 37*  BILITOT 0.9 0.6 0.8  PROT 7.3 5.9* 6.4*  ALBUMIN 3.6 2.8* 3.1*   No results for input(s): LIPASE, AMYLASE in the last 168 hours. No results for input(s): AMMONIA in the last 168 hours. Coagulation Profile: No results for input(s): INR, PROTIME in the last 168 hours. Cardiac Enzymes: Recent Labs  Lab 12/21/17 2224 12/22/17 0408 12/22/17 0955 12/22/17 1512 12/22/17 2108  TROPONINI 0.06* 0.14* 0.11* 0.08* 0.07*   BNP (last 3 results) No results for input(s): PROBNP in the last 8760 hours. HbA1C: No results for input(s): HGBA1C in the last 72 hours. CBG: No  results for input(s): GLUCAP in the last 168 hours. Lipid Profile: No results for input(s): CHOL, HDL, LDLCALC, TRIG, CHOLHDL, LDLDIRECT in the last 72 hours. Thyroid Function Tests: No results for input(s): TSH, T4TOTAL, FREET4, T3FREE, THYROIDAB in the last 72 hours. Anemia Panel: No results for input(s): VITAMINB12, FOLATE, FERRITIN, TIBC, IRON, RETICCTPCT in the last 72 hours. Sepsis Labs: Recent Labs  Lab 12/20/17 1134  LATICACIDVEN 1.0    Recent Results (from the past 240 hour(s))  Gastrointestinal Panel by PCR , Stool     Status: None   Collection Time: 12/20/17  4:35 PM  Result Value Ref Range Status   Campylobacter species NOT DETECTED NOT DETECTED Final   Plesimonas shigelloides NOT DETECTED NOT DETECTED Final   Salmonella species NOT DETECTED NOT DETECTED Final   Yersinia enterocolitica NOT DETECTED NOT DETECTED Final   Vibrio species NOT DETECTED NOT DETECTED Final   Vibrio cholerae NOT DETECTED NOT DETECTED Final   Enteroaggregative E coli (EAEC) NOT DETECTED NOT DETECTED Final   Enteropathogenic E coli (EPEC) NOT DETECTED NOT DETECTED Final   Enterotoxigenic E coli (ETEC) NOT DETECTED NOT DETECTED Final   Shiga like toxin producing E  coli (STEC) NOT DETECTED NOT DETECTED Final   Shigella/Enteroinvasive E coli (EIEC) NOT DETECTED NOT DETECTED Final   Cryptosporidium NOT DETECTED NOT DETECTED Final   Cyclospora cayetanensis NOT DETECTED NOT DETECTED Final   Entamoeba histolytica NOT DETECTED NOT DETECTED Final   Giardia lamblia NOT DETECTED NOT DETECTED Final   Adenovirus F40/41 NOT DETECTED NOT DETECTED Final   Astrovirus NOT DETECTED NOT DETECTED Final   Norovirus GI/GII NOT DETECTED NOT DETECTED Final   Rotavirus A NOT DETECTED NOT DETECTED Final   Sapovirus (I, II, IV, and V) NOT DETECTED NOT DETECTED Final    Comment: Performed at [facility], [address] [place], [place] [address]  C difficile quick scan w PCR reflex     Status: None    Collection Time: 12/21/17  5:34 PM  Result Value Ref Range Status   C Diff antigen NEGATIVE NEGATIVE Final   C Diff toxin NEGATIVE NEGATIVE Final   C Diff interpretation No C. difficile detected.  Final    Comment: Performed at [facility], [address] [place], [place] [address]  MRSA PCR Screening     Status: None   Collection Time: 12/23/17  4:19 PM  Result Value Ref Range Status   MRSA by PCR NEGATIVE NEGATIVE Final    Comment:        The GeneXpert MRSA Assay (FDA approved for NASAL specimens only), is one component of a comprehensive MRSA colonization surveillance program. It is not intended to diagnose MRSA infection nor to guide or monitor treatment for MRSA infections. Performed at [facility], [address] [place], [place] [address]          Radiology Studies: Dg Abd 1 View  Result Date: 12/25/2017 CLINICAL DATA:  Assessment for small bowel obstruction. 24 hour delayed film. EXAM: ABDOMEN - 1 VIEW COMPARISON:  Radiograph yesterday at 0133 hour FINDINGS: Administered enteric contrast has dissipated and is seen primarily throughout small bowel. Patient has had ileal colonic anastomosis in the transverse colon, and enteric contrast does appear to reach the splenic flexure. Persistent gaseous small bowel distention in the central abdomen. IMPRESSION: Probable administered enteric contrast in the splenic flexure of the colon. Additional enteric contrast has dissipated and is seen in throughout small bowel. Electronically Signed   By: Rubye OaksMelanie  Ehinger M.D.   On: 12/25/2017 01:59    Scheduled Meds: . cloNIDine  0.2 mg Transdermal Weekly  . diltiazem  240 mg Oral Daily  . mouth rinse  15 mL Mouth Rinse BID   Continuous Infusions: . sodium chloride 75 mL/hr at 12/26/17 0627  . ciprofloxacin Stopped (12/26/17 0301)  . heparin 700 Units/hr (12/26/17 0627)  . metronidazole Stopped (12/26/17 0727)     LOS: 7 days   Penny Piarlando  Zohra Clavel, MD [facility] If 7PM-7AM, please contact night-coverage [email or web address] Password [email or web address] 12/26/2017, 8:23 AM

## 2017-12-26 NOTE — Progress Notes (Signed)
ZO:[id number] SBO  Subjective: Feeling better, just 1 Bm recorded for yesterday.  She is rather sore all over, but no abdominal pain, less distension on full liquids, but has only taken a little of the clears so far.    Objective: Vital signs in last 24 hours: Temp:  [97.4 F (36.3 C)-98 F (36.7 C)] 98 F (36.7 C) (02/18 0400) Pulse Rate:  [58-68] 59 (02/18 0500) Resp:  [13-18] 16 (02/18 0500) BP: (138-194)/(25-43) 146/29 (02/18 0500) SpO2:  [91 %-97 %] 94 % (02/18 0500) Last BM Date: 12/25/17 100 PO IV 3300 Urine 525 recorded Stool x 1 Afebrile, VSS WBC 10.4 H/H is stable Last film 2/17; results below contrast in colon  Intake/Output from previous day: 02/17 0701 - 02/18 0700 In: 3168.1 [P.O.:100; I.V.:2668.1; IV Piggyback:400] Out: 525 [Urine:525] Intake/Output this shift: No intake/output data recorded.  General appearance: alert, cooperative and no distress GI: soft, sore all over, but no abdominal pain, not distended this AM, + BS, and BM.    Lab Results:  Recent Labs    12/25/17 0330 12/26/17 0136  WBC 7.1 10.4  HGB 10.7* 10.5*  HCT 31.0* 31.0*  PLT 178 247    BMET Recent Labs    12/24/17 0245 12/25/17 0330  NA 137 139  K 3.1* 3.3*  CL 108 109  CO2 19* 21*  GLUCOSE 108* 111*  BUN 13 11  CREATININE 1.55* 1.45*  CALCIUM 9.0 9.2   PT/INR No results for input(s): LABPROT, INR in the last 72 hours.  Recent Labs  Lab 12/20/17 1134 12/22/17 0408 12/24/17 0245  AST 26 29 29   ALT 16 17 22   ALKPHOS 43 32* 37*  BILITOT 0.9 0.6 0.8  PROT 7.3 5.9* 6.4*  ALBUMIN 3.6 2.8* 3.1*     Lipase     Component Value Date/Time   LIPASE 47 12/18/2017 0134     Medications: . cloNIDine  0.2 mg Transdermal Weekly  . mouth rinse  15 mL Mouth Rinse BID   . sodium chloride 75 mL/hr at 12/26/17 0627  . ciprofloxacin Stopped (12/26/17 0301)  . heparin 700 Units/hr (12/26/17 0627)  . labetalol (NORMODYNE) infusion 2 mg/min (12/26/17 0500)  .  metronidazole Stopped (12/26/17 0727)   Anti-infectives (From admission, onward)   Start     Dose/Rate Route Frequency Ordered Stop   12/24/17 1400  ciprofloxacin (CIPRO) IVPB 200 mg     200 mg 100 mL/hr over 60 Minutes Intravenous Every 12 hours 12/24/17 1224     12/24/17 1400  metroNIDAZOLE (FLAGYL) IVPB 500 mg     500 mg 100 mL/hr over 60 Minutes Intravenous Every 8 hours 12/24/17 1224        Assessment/Plan Possible SBO vs gastroenteritis Abdominal pain, cramps and diarrhea times 3 months. New-onset nausea and vomiting NG placed 12/23/17 SB protocol=>> enteric contrast has dissipated and is seen primarily throughout small bowel. Patient has had ileal colonic anastomosis in the transverse colon, and enteric contrast does appear to reach the splenic flexure. Persistent gaseous small bowel distention in the central abdomen  New chest Pain with mild troponin elevation - cardiology following, on heparin gtt History of polyps/polypectomy with colon perforation/partial colectomy CAD with history of MI/prior stents/CABG x3 in [place] -current cardiologist is in [place]  Peripheral vascular occlusive disease with ABIs 0.55 on the right, 0.26 on the left - vascular evaluated; CTA showed no significant SMA occlusion History of tobacco use/asthma Hypertension Hypothyroid  FEN: IV fluids/full liquids ID: Cipro/Flagyl 2/16 =>>  day 3 DVT: Heparindrip Foley: None Follow up: TBD  Plan:  Pt better, no SBO per SB protocol. Diet is slowly being advanced.  We will be available if there is a change, but will sign off for now.  Call if we can help.         LOS: 7 days    Katie [name] 12/26/2017 [phone]

## 2017-12-26 NOTE — Progress Notes (Signed)
ANTICOAGULATION CONSULT NOTE - Follow Up Consult  Pharmacy Consult for Heparin Indication: r/o ACS  Allergies  Allergen Reactions  . Clams [Shellfish Allergy] Anaphylaxis  . Hydralazine Other (See Comments)    Possible cause of chest discomfort  . Red Dye Other (See Comments)    Constipation     Patient Measurements: Height: 4\' 9"  (144.8 cm) Weight: 132 lb 0.9 oz (59.9 kg) IBW/kg (Calculated) : 38.6 Heparin Dosing Weight:   Vital Signs: Temp: 98 F (36.7 C) (02/18 0400) Temp Source: Oral (02/18 0400) BP: 146/29 (02/18 0500) Pulse Rate: 59 (02/18 0500)  Labs: Recent Labs    12/24/17 0245  12/25/17 0330 12/25/17 1530 12/26/17 0136  HGB 11.1*  --  10.7*  --  10.5*  HCT 32.0*  --  31.0*  --  31.0*  PLT 237  --  178  --  247  HEPARINUNFRC  --    < > 0.23* 0.18* 0.48  CREATININE 1.55*  --  1.45*  --   --    < > = values in this interval not displayed.    Estimated Creatinine Clearance: 22.2 mL/min (A) (by C-G formula based on SCr of 1.45 mg/dL (H)).   Medications:  Infusions:  . sodium chloride 75 mL/hr at 12/25/17 2000  . ciprofloxacin Stopped (12/26/17 0301)  . heparin 700 Units/hr (12/25/17 2000)  . labetalol (NORMODYNE) infusion 2 mg/min (12/26/17 0012)  . metronidazole Stopped (12/25/17 2243)    Assessment: Patient with heparin level at goal.  No heparin issues noted.  Goal of Therapy:  Heparin level 0.3-0.7 units/ml Monitor platelets by anticoagulation protocol: Yes   Plan:  Continue heparin drip at current rate Recheck level at 1000  Darlina GuysGrimsley Jr, RossvilleJulian Crowford 12/26/2017,5:20 AM

## 2017-12-26 NOTE — Progress Notes (Addendum)
Subjective:  Abdominal pain improved.  NG tube removed Having bowel movements  Objective:  Vital Signs in the last 24 hours: Temp:  [97.2 F (36.2 C)-98 F (36.7 C)] 97.2 F (36.2 C) (02/18 0800) Pulse Rate:  [58-68] 60 (02/18 0900) Resp:  [13-18] 18 (02/18 0900) BP: (138-194)/(25-43) 160/33 (02/18 0900) SpO2:  [91 %-97 %] 93 % (02/18 0900)  Intake/Output from previous day: 02/17 0701 - 02/18 0700 In: 3492.1 [P.O.:100; I.V.:2892.1; IV Piggyback:500] Out: 525 [Urine:525] Intake/Output from this shift: Total I/O In: 269.6 [I.V.:269.6] Out: -   Urine output not accurately measured.   Physical Exam:  Nursing note and vitals reviewed. Constitutional: She appears well-developed and well-nourished.  HENT:  Head: Normocephalic and atraumatic.  Eyes: Conjunctivae are normal. Pupils are equal, round, and reactive to light.  Neck: Normal range of motion. Neck supple. No JVD present.  Cardiovascular: Normal rate, regular rhythm and normal heart sounds.  II/VI diastolic murmur aortic area Pulses:      Carotid pulses are on the right side with bruit.      Femoral pulses are 2+ on the right side, and 1+ on the left side.      Dorsalis pedis pulses are 0 on the right side, and 0 on the left side.       Posterior tibial pulses are 0 on the right side, and 0 on the left side.  Respiratory: Effort normal and breath sounds normal. She has no wheezes. She has no rales.  Abdominal: No abdominal tenderness. NG tube in place Musculoskeletal: She exhibits no edema.      Lab Results: Recent Labs    12/25/17 0330 12/26/17 0136  WBC 7.1 10.4  HGB 10.7* 10.5*  PLT 178 247   Recent Labs    12/24/17 0245 12/25/17 0330  NA 137 139  K 3.1* 3.3*  CL 108 109  CO2 19* 21*  GLUCOSE 108* 111*  BUN 13 11  CREATININE 1.55* 1.45*   No results for input(s): TROPONINI in the last 72 hours.  Invalid input(s): CK, MB Hepatic Function Panel Recent Labs    12/24/17 0245  PROT 6.4*   ALBUMIN 3.1*  AST 29  ALT 22  ALKPHOS 37*  BILITOT 0.8     EKG 12/21/2017: Sinus rhythm. Normal axis. First degree AV block. Left ventricular hypertrophy. Left atrial enlargement. Inferolateral ST-T changes, likely due to LVH. Cannot exclude ischemia.    Imaging: Abdominal xray 12/24/2017 CLINICAL DATA:  Small-bowel obstruction, 8 hour delayed image.  EXAM: PORTABLE ABDOMEN - 1 VIEW  COMPARISON:  12/23/2017 at 1159 hours  FINDINGS: Enteric contrast is seen within the stomach with gastric tube in place. Persistent unchanged dilatation of small bowel loops compatible small bowel obstruction is identified. Flocculent contrast within the stomach is noted without antegrade progression.  IMPRESSION: Unchanged small bowel dilatation compatible with high-grade partial or early small obstruction. Contrast remains within the stomach at 8 hours from the start of imaging.   Electronically Signed   By: Tollie Eth M.D.   On: 12/24/2017 01:59  Cardiac Studies: Echocardiogram 12/22/2017: Study Conclusions  - Left ventricle: The cavity size was normal. There was severe   concentric hypertrophy. Systolic function was vigorous. The   estimated ejection fraction was in the range of 65% to 70%. Wall   motion was normal; there were no regional wall motion   abnormalities. Doppler parameters are consistent with abnormal   left ventricular relaxation (grade 1 diastolic dysfunction). The   E/e&' ratio is >15,  suggesting elevated LV filling pressure. - Mitral valve: Mildly thickened leaflets . There was mild   regurgitation. - Left atrium: The atrium was normal in size. - Tricuspid valve: There was mild regurgitation. - Pulmonary arteries: PA peak pressure: 35 mm Hg (S). - Inferior vena cava: The vessel was normal in size. The   respirophasic diameter changes were in the normal range (>= 50%),   consistent with normal central venous pressure.  Impressions:  - LVEF  65-70%, there is severe LVH, normal wall motion, grade 1 DD,   elevated LV filling pressure, mild MR, normal LA size, mild TR,   RVSP 35 mmHg, normal IVC.  Assessment:  84 year old female with coronary artery disease status post CABG Troponin elevation: While the patient has underlying carotid artery disease, overnight event of chest pain and troponin elevation likely a type II MI event in the setting of uncontrolled blood pressure. 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 Currently on clonidine 0.2 mg patch, on iv hydralazine prn. No other scheduled medications. Hypothyroidism  Recommendations: Being weaned off labetalol drip. Diltiazem resumed. Given concern for renal artery stenosis, recommend adding bidil 1 capt tid, instead of ACEi/ARB Continue IV heparin ASA 81, lipitor 80 Plan for nuclear stress test 12/27/17. If significant ischemia, recommend coronary angiography with possible intervention. I have discussed the risks/benefits/alternate options with the patient and her daughter Delsa BernRenee Perry. Will also evaluate for any significant renal artery stenosis. Recommend limited echocardiogram to evaluate aortic valve. Limited evaluation on prior echocardiogram. Rest of the management per the primary team.  Will follow after stress test.    LOS: 7 days    Markan Cazarez J Keiley Levey 12/26/2017, 9:53 AM  Elder NegusManish J Adelyne Marchese, MD [facility] Pager: [phone] Office: [phone] If no answer Cell [phone]

## 2017-12-26 NOTE — Progress Notes (Signed)
Initial Nutrition Assessment  DOCUMENTATION CODES:   Not applicable  INTERVENTION:    Ensure Enlive po BID, each supplement provides 350 kcal and 20 grams of protein  Monitor for diet advancement/toleration  NUTRITION DIAGNOSIS:   Inadequate oral intake related to altered GI function as evidenced by meal completion < 50%, per patient/family report.  GOAL:   Patient will meet greater than or equal to 90% of their needs  MONITOR:   PO intake, Supplement acceptance, Diet advancement, Weight trends, Labs, I & O's  REASON FOR ASSESSMENT:   Malnutrition Screening Tool    ASSESSMENT:   Pt with PMH significant for CAD s/p CABG, partial colectomy, HTN, HLD, and COPD. Presents this admission with complaints of leg cramping, nausea, and vomiting. Was found to have small bowel dysmotility with no obstruction. Admitted for viral gastroenteritis.    2/15- NGT placed  2/17- NGT removed  Spoke with pt at bedside. Reports having poor appetite for 6 months PTA. States PTA she could tolerate a handful of food at each meal three times per day. Pt currently on full liquid diet. Reports having chicken broth this morning that resulted in immediate diarrhea. Discussed the importance of protein while on liquid diet, pt amendable. Plan for diet advancement possibly today.   Pt endorses UBW of 149 lb, the last time being at that weight 6 months ago. Records are limited in weight history. This admission pt weighed 139 lb 2/16 and weighs 132 lb today, likely from recurrent diarrhea. Nutrition-Focused physical exam completed.   Medications reviewed and include: NS @ 75 ml/hr, IV abx Labs reviewed: K 3.3 (L)   NUTRITION - FOCUSED PHYSICAL EXAM:    Most Recent Value  Orbital Region  No depletion  Upper Arm Region  No depletion  Thoracic and Lumbar Region  Unable to assess  Buccal Region  No depletion  Temple Region  No depletion  Clavicle Bone Region  No depletion  Clavicle and Acromion Bone  Region  No depletion  Scapular Bone Region  Unable to assess  Dorsal Hand  No depletion  Patellar Region  Mild depletion  Anterior Thigh Region  Mild depletion  Posterior Calf Region  Mild depletion  Edema (RD Assessment)  None  Hair  Reviewed  Eyes  Reviewed  Mouth  Reviewed  Skin  Reviewed  Nails  Reviewed      Diet Order:  Diet full liquid Room service appropriate? Yes; Fluid consistency: Thin  EDUCATION NEEDS:   Education needs have been addressed  Skin:  Skin Assessment: Reviewed RN Assessment  Last BM:  12/26/17  Height:   Ht Readings from Last 1 Encounters:  12/18/17 4\' 9"  (1.448 m)    Weight:   Wt Readings from Last 1 Encounters:  12/24/17 132 lb 0.9 oz (59.9 kg)    Ideal Body Weight:  42 kg  BMI:  Body mass index is 28.58 kg/m.  Estimated Nutritional Needs:   Kcal:  1300-1500 kcal/day  Protein:  60-70 g/day  Fluid:  >1.3 L/day    Vanessa Kickarly Tezra Mahr RD, LDN Clinical Nutrition Pager # - [phone]

## 2017-12-26 NOTE — Telephone Encounter (Signed)
Sched appt 02/03/18 at 10:15. Lm on hm# to inform pt of appt.

## 2017-12-27 ENCOUNTER — Ambulatory Visit (HOSPITAL_COMMUNITY)
Admit: 2017-12-27 | Discharge: 2017-12-27 | Disposition: A | Discharge status: Home / Self Care | Payer: Medicare HMO | Attending: Cardiology | Admitting: Cardiology

## 2017-12-27 ENCOUNTER — Inpatient Hospital Stay (HOSPITAL_COMMUNITY): Payer: PRIVATE HEALTH INSURANCE

## 2017-12-27 ENCOUNTER — Inpatient Hospital Stay (HOSPITAL_COMMUNITY)
Admit: 2017-12-27 | Discharge: 2017-12-27 | Disposition: A | Discharge status: Home / Self Care | Payer: Medicare HMO | Attending: Cardiology | Admitting: Cardiology

## 2017-12-27 LAB — CBC
HCT: 27.5 % — ABNORMAL LOW (ref 36.0–46.0)
Hemoglobin: 9.4 g/dL — ABNORMAL LOW (ref 12.0–15.0)
MCH: 30.8 pg (ref 26.0–34.0)
MCHC: 34.2 g/dL (ref 30.0–36.0)
MCV: 90.2 fL (ref 78.0–100.0)
Platelets: 268 10*3/uL (ref 150–400)
RBC: 3.05 MIL/uL — ABNORMAL LOW (ref 3.87–5.11)
RDW: 14.6 % (ref 11.5–15.5)
WBC: 10.2 10*3/uL (ref 4.0–10.5)

## 2017-12-27 LAB — HEPARIN LEVEL (UNFRACTIONATED): HEPARIN UNFRACTIONATED: 0.47 [IU]/mL (ref 0.30–0.70)

## 2017-12-27 MED ORDER — TECHNETIUM TC 99M TETROFOSMIN IV KIT
10.0000 | PACK | Freq: Once | INTRAVENOUS | Status: AC | PRN
  Administered 2017-12-27: 10 via INTRAVENOUS

## 2017-12-27 MED ORDER — SODIUM CHLORIDE 0.9 % IV SOLN: INTRAVENOUS | Status: DC

## 2017-12-27 MED ORDER — REGADENOSON 0.4 MG/5ML IV SOLN
INTRAVENOUS | Status: AC
  Administered 2017-12-27: 10:00:00
  Filled 2017-12-27: qty 5

## 2017-12-27 MED ORDER — AMLODIPINE BESYLATE 5 MG PO TABS
5.0000 mg | ORAL_TABLET | Freq: Every day | ORAL | Status: DC
  Administered 2017-12-27 – 2017-12-29 (×3): 5 mg via ORAL
  Filled 2017-12-27 (×3): qty 1

## 2017-12-27 MED ORDER — TECHNETIUM TC 99M TETROFOSMIN IV KIT
30.0000 | PACK | Freq: Once | INTRAVENOUS | Status: AC | PRN
  Administered 2017-12-27: 30 via INTRAVENOUS

## 2017-12-27 NOTE — Progress Notes (Addendum)
Subjective:  Abdominal pain improved.  NG tube removed Having bowel movements  Had stress test today. No significant ischemia. Normal EF.   Objective:  Vital Signs in the last 24 hours: Temp:  [97.6 F (36.4 C)-98.5 F (36.9 C)] 98.5 F (36.9 C) (02/19 0304) Pulse Rate:  [54-88] 79 (02/19 1056) Resp:  [16-22] 18 (02/19 0600) BP: (116-190)/(14-57) 136/41 (02/19 1054) SpO2:  [90 %-94 %] 93 % (02/19 0600)  Intake/Output from previous day: 02/18 0701 - 02/19 0700 In: 2453.5 [I.V.:1953.5; IV Piggyback:500] Out: -  Intake/Output from this shift: Total I/O In: 157 [I.V.:157] Out: 300 [Urine:300]  Urine output not accurately measured.   Physical Exam:  Nursing note and vitals reviewed. Constitutional: She appears well-developed and well-nourished.  HENT:  Head: Normocephalic and atraumatic.  Eyes: Conjunctivae are normal. Pupils are equal, round, and reactive to light.  Neck: Normal range of motion. Neck supple. No JVD present.  Cardiovascular: Normal rate, regular rhythm and normal heart sounds.  II/VI diastolic murmur aortic area Pulses:      Carotid pulses are on the right side with bruit.      Femoral pulses are 2+ on the right side, and 1+ on the left side.      Dorsalis pedis pulses are 0 on the right side, and 0 on the left side.       Posterior tibial pulses are 0 on the right side, and 0 on the left side.  Respiratory: Effort normal and breath sounds normal. She has no wheezes. She has no rales.  Abdominal: No abdominal tenderness. NG tube in place Musculoskeletal: She exhibits no edema.      Lab Results: Recent Labs    12/26/17 0136 12/27/17 0418  WBC 10.4 10.2  HGB 10.5* 9.4*  PLT 247 268   Recent Labs    12/25/17 0330  NA 139  K 3.3*  CL 109  CO2 21*  GLUCOSE 111*  BUN 11  CREATININE 1.45*   No results for input(s): TROPONINI in the last 72 hours.  Invalid input(s): CK, MB Hepatic Function Panel No results for input(s): PROT, ALBUMIN,  AST, ALT, ALKPHOS, BILITOT, BILIDIR, IBILI in the last 72 hours.   EKG 12/21/2017: Sinus rhythm. Normal axis. First degree AV block. Left ventricular hypertrophy. Left atrial enlargement. Inferolateral ST-T changes, likely due to LVH. Cannot exclude ischemia.    Imaging: Abdominal xray 12/24/2017 CLINICAL DATA:  Small-bowel obstruction, 8 hour delayed image.  EXAM: PORTABLE ABDOMEN - 1 VIEW  COMPARISON:  12/23/2017 at 1159 hours  FINDINGS: Enteric contrast is seen within the stomach with gastric tube in place. Persistent unchanged dilatation of small bowel loops compatible small bowel obstruction is identified. Flocculent contrast within the stomach is noted without antegrade progression.  IMPRESSION: Unchanged small bowel dilatation compatible with high-grade partial or early small obstruction. Contrast remains within the stomach at 8 hours from the start of imaging.   Electronically Signed   By: Tollie Eth M.D.   On: 12/24/2017 01:59  Cardiac Studies: Echocardiogram 12/22/2017: Study Conclusions  - Left ventricle: The cavity size was normal. There was severe   concentric hypertrophy. Systolic function was vigorous. The   estimated ejection fraction was in the range of 65% to 70%. Wall   motion was normal; there were no regional wall motion   abnormalities. Doppler parameters are consistent with abnormal   left ventricular relaxation (grade 1 diastolic dysfunction). The   E/e&' ratio is >15, suggesting elevated LV filling pressure. - Mitral valve: Mildly  thickened leaflets . There was mild   regurgitation. - Left atrium: The atrium was normal in size. - Tricuspid valve: There was mild regurgitation. - Pulmonary arteries: PA peak pressure: 35 mm Hg (S). - Inferior vena cava: The vessel was normal in size. The   respirophasic diameter changes were in the normal range (>= 50%),   consistent with normal central venous pressure.  Impressions:  - LVEF  65-70%, there is severe LVH, normal wall motion, grade 1 DD,   elevated LV filling pressure, mild MR, normal LA size, mild TR,   RVSP 35 mmHg, normal IVC.  Nuclear stress test 12/27/2017: IMPRESSION: 1. Moderate to large size matched defect compatible with scar in the anterior wall extending from the cardiac apex to the mid heart. Small matched defect in the lateral wall at the cardiac base. No inducible ischemia.  2. Mild septal hypokinesis.  3. Left ventricular ejection fraction 58%  4. Non invasive risk stratification*: Low  Assessment:  84 year old female with coronary artery disease status post CABG Troponin elevation: While the patient has underlying carotid artery disease, overnight event of chest pain and troponin elevation likely a type II MI event in the setting of uncontrolled blood pressure. Low risk stress test. Anterior wall fixed defect. Given normal wall motion, could well be due to breast attenuation. No significant ischemia. 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 Currently on clonidine 0.2 mg patch, on iv hydralazine prn. No other scheduled medications. Hypothyroidism  Recommendations: Recommend aggressive management of hypertension. Recommend outpatient follow up with patient's primary cardiologist Dr. Vilinda BoehringerGary Renaldo.  Continue diltiazem, bidil. Could add amlodipine. Would be cautious with ACEi/ARB with suspected renal artery stenosis. Heparin can be stopped. Continue ASA 81, lipitor 80 Recommend limited echocardiogram to evaluate aortic valve. Limited evaluation on prior echocardiogram. Rest of the management per the primary team.     LOS: 8 days    Charlita Brian J Haniyyah Sakuma 12/27/2017,  12:28 PM  Bryahna Lesko Emiliano DyerJ Oskar Cretella, MD [facility] Pager: [phone] Office: [phone] If no answer Cell [phone]

## 2017-12-27 NOTE — Progress Notes (Signed)
PROGRESS NOTE    Katie [name]  ZOX:[id number] DOB: [birth year] DOA: 12/18/2017 PCP: Macy MisBriscoe, Kim K, MD Brief Katie FractionNarrative84 y.o.femalewitha history of CAD s/p CABG x3 2008, partial colectomy for invasive polyp and take back for postop infection remotely, HTN, hypothyroidism, HLD, and COPD who presented to the ED after she awoke with leg cramping and noted significant nausea, vomiting and generalized abdominal pain since last evening. This has happened sporadically for a few weeks, but was more severe than previously and associated with loose stools and abdominal bloating. She was hypertensive and afebrile in the ED with continued vomiting, AKI, leukocytosis and ketonuria. CT showed evidence of small bowel dysmotility, but no obstruction. It also noted diffuse aortic atherosclerosis and severe infrarenal luminal narrowing. Admission was requested for dehydration, AKI due to intractable nausea and vomiting due to presumed viral gastroenteritis.  12/24/2017-multiple episodes of diarrhea.kub-Unchanged small bowel dilatation compatible with high-grade partial or early small obstruction.   2/18 pt taking some clears Gen surgery signing off  2/19 [facility]. [facility]   Assessment & Plan:   Principal Problem:   Viral gastroenteritis Active Problems:   AKI (acute kidney injury) (HCC)   Aortic atherosclerosis (HCC)   CAD (coronary artery disease)   HTN (hypertension)   Hypothyroidism   UTI (urinary tract infection)   Generalized abdominal pain   Nausea and vomiting   Generalized abdominal tenderness   Chest pain  1)CAD history of CABG/HTN -  Cardiology evaluated   2) Intractable nausea, vomiting, diarrhea, abdominal pain: CT with suggestion of dysmotility, no obstruction. Will treat empirically for gastroenteritis.  - Surgery assisted, has signed off as patient does not have SBO and is tolerating diet (today started  solids) if tolerates solids may d/c next am.  AKI: suspected elevation in S creatinine secondary to dehydration - improving bun/creatinine ratio is down.  Aortic atherosclerosis with infrarenal luminal narrowing: Appreciate Dr. Carola RhineKane's input.  He thinks patient's ABI is showing severe PAD.  However he wants to wait and follow and see her as an outpatient once her current issues have been resolved.    CAD: s/p CABG x3 in 2008, [facility] reports low risk.  Pt does not complain of any chest pain. Troponin shows flat trend and decreasing on last check.  HTN: -  Will add amlodipine to patient regimen. Per my discussion with cardiology there is a suspicion to patient having bilateral renal artery stenosis as such we will avoid ARB and ACE inhibitor. Patient is on clonidine, Bidil, and diltiazem  Asthma:  - Stable continue current regimen.  Hypothyroidism: Chronic, stable - Stable on synthroid   DVT prophylaxis: heparin Code Status:full Family Communication: none Disposition Plan: tbd Consultants: gi,surgery  Procedures:  Nuclear stress test 12/27/2017:   IMPRESSION: 1. Moderate to large size matched defect compatible with scar in the anterior wall extending from the cardiac apex to the mid heart. Small matched defect in the lateral wall at the cardiac base. No inducible ischemia.  2. Mild septal hypokinesis.  3. Left ventricular ejection [name] 58%  4. Non invasive risk stratification*: Low  Antimicrobials: Flagyl, Cipro  Subjective: Pt has no new complaints.   Objective: Vitals:   12/27/17 1333 12/27/17 1500 12/27/17 1551 12/27/17 1600  BP: (!) 197/32  (!) 176/34   Pulse:  (!) 59 61   Resp: 20 (!) 21 (!) 22   Temp:    Marland Kitchen(!)  97.4 F (36.3 C)  TempSrc:    Oral  SpO2: 95% 95% 98%   Weight:      Height:        Intake/Output Summary (Last 24 hours) at 12/27/2017 1629 Last data filed at 12/27/2017 1546 Gross  per 24 hour  Intake 1794 ml  Output 300 ml  Net 1494 ml   Filed Weights   12/18/17 0651 12/24/17 0356 12/24/17 2337  Weight: 63.1 kg (139 lb 1.8 oz) 63.1 kg (139 lb 1.8 oz) 59.9 kg (132 lb 0.9 oz)    Examination:  General exam: Appears calm and comfortable, in nad. Respiratory system:  Equal chest rise, no increased wob, no wheezes Cardiovascular system: S1 & S2 heard, RRR. No JVD, murmurs, rubs Gastrointestinal system: Abdomen is nondistended, soft and nontender. No organomegaly or masses felt. Normal bowel sounds heard. Ng tube removed. Central nervous system: Alert and oriented. No focal neurological deficits. Extremities: Symmetric 5 x 5 power. Skin: No rashes, lesions or ulcers, on limited exam. Psychiatry: Mood & affect appropriate.     Data Reviewed: I have personally reviewed following labs and imaging studies  CBC: Recent Labs  Lab 12/23/17 0128 12/24/17 0245 12/25/17 0330 12/26/17 0136 12/27/17 0418  WBC 7.7 5.4 7.1 10.4 10.2  HGB 11.5* 11.1* 10.7* 10.5* 9.4*  HCT 33.0* 32.0* 31.0* 31.0* 27.5*  MCV 89.4 88.6 89.3 89.9 90.2  PLT 241 237 178 247 268   Basic Metabolic Panel: Recent Labs  Lab 12/21/17 0600 12/22/17 0408 12/23/17 0128 12/24/17 0245 12/25/17 0330  NA 139 135 137 137 139  K 3.9 3.6 3.7 3.1* 3.3*  CL 108 110 109 108 109  CO2 16* 16* 17* 19* 21*  GLUCOSE 74 71 89 108* 111*  BUN 15 15 13 13 11   CREATININE 1.38* 1.32* 1.36* 1.55* 1.45*  CALCIUM 9.0 8.4* 9.3 9.0 9.2   GFR: Estimated Creatinine Clearance: 22.2 mL/min (A) (by C-G formula based on SCr of 1.45 mg/dL (H)). Liver Function Tests: Recent Labs  Lab 12/22/17 0408 12/24/17 0245  AST 29 29  ALT 17 22  ALKPHOS 32* 37*  BILITOT 0.6 0.8  PROT 5.9* 6.4*  ALBUMIN 2.8* 3.1*   No results for input(s): LIPASE, AMYLASE in the last 168 hours. No results for input(s): AMMONIA in the last 168 hours. Coagulation Profile: No results for input(s): INR, PROTIME in the last 168  hours. Cardiac Enzymes: Recent Labs  Lab 12/21/17 2224 12/22/17 0408 12/22/17 0955 12/22/17 1512 12/22/17 2108  TROPONINI 0.06* 0.14* 0.11* 0.08* 0.07*   BNP (last 3 results) No results for input(s): PROBNP in the last 8760 hours. HbA1C: No results for input(s): HGBA1C in the last 72 hours. CBG: No results for input(s): GLUCAP in the last 168 hours. Lipid Profile: No results for input(s): CHOL, HDL, LDLCALC, TRIG, CHOLHDL, LDLDIRECT in the last 72 hours. Thyroid Function Tests: No results for input(s): TSH, T4TOTAL, FREET4, T3FREE, THYROIDAB in the last 72 hours. Anemia Panel: No results for input(s): VITAMINB12, FOLATE, FERRITIN, TIBC, IRON, RETICCTPCT in the last 72 hours. Sepsis Labs: No results for input(s): PROCALCITON, LATICACIDVEN in the last 168 hours.  Recent Results (from the past 240 hour(s))  Gastrointestinal Panel by PCR , Stool     Status: None   Collection Time: 12/20/17  4:35 PM  Result Value Ref Range Status   Campylobacter species NOT DETECTED NOT DETECTED Final   Plesimonas shigelloides NOT DETECTED NOT DETECTED Final   Salmonella species NOT DETECTED NOT DETECTED Final  Yersinia enterocolitica NOT DETECTED NOT DETECTED Final   Vibrio species NOT DETECTED NOT DETECTED Final   Vibrio cholerae NOT DETECTED NOT DETECTED Final   Enteroaggregative E coli (EAEC) NOT DETECTED NOT DETECTED Final   Enteropathogenic E coli (EPEC) NOT DETECTED NOT DETECTED Final   Enterotoxigenic E coli (ETEC) NOT DETECTED NOT DETECTED Final   Shiga like toxin producing E coli (STEC) NOT DETECTED NOT DETECTED Final   Shigella/Enteroinvasive E coli (EIEC) NOT DETECTED NOT DETECTED Final   Cryptosporidium NOT DETECTED NOT DETECTED Final   Cyclospora cayetanensis NOT DETECTED NOT DETECTED Final   Entamoeba histolytica NOT DETECTED NOT DETECTED Final   Giardia lamblia NOT DETECTED NOT DETECTED Final   Adenovirus F40/41 NOT DETECTED NOT DETECTED Final   Astrovirus NOT DETECTED NOT  DETECTED Final   Norovirus GI/GII NOT DETECTED NOT DETECTED Final   Rotavirus A NOT DETECTED NOT DETECTED Final   Sapovirus (I, II, IV, and V) NOT DETECTED NOT DETECTED Final    Comment: Performed at [facility], [address] [place], [place] [address]  C difficile quick scan w PCR reflex     Status: None   Collection Time: 12/21/17  5:34 PM  Result Value Ref Range Status   C Diff antigen NEGATIVE NEGATIVE Final   C Diff toxin NEGATIVE NEGATIVE Final   C Diff interpretation No C. difficile detected.  Final    Comment: Performed at [facility], [address] [place], [place] [address]  MRSA PCR Screening     Status: None   Collection Time: 12/23/17  4:19 PM  Result Value Ref Range Status   MRSA by PCR NEGATIVE NEGATIVE Final    Comment:        The GeneXpert MRSA Assay (FDA approved for NASAL specimens only), is one component of a comprehensive MRSA colonization surveillance program. It is not intended to diagnose MRSA infection nor to guide or monitor treatment for MRSA infections. Performed at [facility], [address] [place], [place] [address]          Radiology Studies: Nm Myocar Multi W/planar Izetta Dakin Motion / Ef  Result Date: 12/27/2017 CLINICAL DATA:  Myocardial infarction. Prior CABG. Hypertension. Congestive heart failure and coronary artery disease. EXAM: MYOCARDIAL IMAGING WITH SPECT (REST AND EXERCISE) GATED LEFT VENTRICULAR WALL MOTION STUDY LEFT VENTRICULAR EJECTION [name] TECHNIQUE: Standard myocardial SPECT imaging was performed after resting intravenous injection of 10 mCi Tc-31m tetrofosmin. Subsequently, exercise tolerance test was performed by the patient under the supervision of the Cardiology staff. At peak-stress, 30 mCi Tc-65m tetrofosmin was injected intravenously and standard myocardial SPECT imaging was performed. Quantitative gated imaging was also performed to evaluate left ventricular wall  motion, and estimate left ventricular ejection [name]. COMPARISON:  None FINDINGS: Perfusion: In the anterior left ventricular wall there is a matched defect on stress and rest images extending from the apex to the mid heart, overall moderate to large in size. Matched reduced activity in the lateral wall at the cardiac base is relatively small. No inducible ischemia. Wall Motion: Mild septal hypokinesis Left Ventricular Ejection [name]: 58 % End diastolic volume 88 ml End systolic volume 36 ml IMPRESSION: 1. Moderate to large size matched defect compatible with scar in the anterior wall extending from the cardiac apex to the mid heart. Small matched defect in the lateral wall at the cardiac base. No inducible ischemia. 2. Mild septal hypokinesis. 3. Left ventricular ejection [name] 58% 4. Non invasive risk stratification*: Low *2012 Appropriate  Use Criteria for Coronary Revascularization Focused Update: J Am Coll Cardiol. 2012;59(9):857-881. http://content.dementiazones.com.aspx?articleid=[email or web address] Electronically Signed   By: Gaylyn Rong M.D.   On: 12/27/2017 12:35    Scheduled Meds: . amLODipine  5 mg Oral Daily  . cloNIDine  0.2 mg Transdermal Weekly  . diltiazem  240 mg Oral Daily  . feeding supplement (ENSURE ENLIVE)  237 mL Oral BID BM  . isosorbide-hydrALAZINE  1 tablet Oral TID  . mouth rinse  15 mL Mouth Rinse BID   Continuous Infusions: . sodium chloride 75 mL/hr at 12/27/17 0159  . ciprofloxacin 200 mg (12/27/17 1546)  . heparin 700 Units/hr (12/26/17 0627)  . metronidazole Stopped (12/27/17 1511)     LOS: 8 days   Penny Pia, MD [facility] If 7PM-7AM, please contact night-coverage [email or web address] Password [facility] 12/27/2017, 4:29 PM

## 2017-12-27 NOTE — Progress Notes (Signed)
Attempted to do echo in late AM.  [facility] was [facility] doing a stress test per nurse.

## 2017-12-27 NOTE — Progress Notes (Signed)
ANTICOAGULATION CONSULT NOTE - Follow Up Consult  Pharmacy Consult for heparin  Indication: r/o ACS  Allergies  Allergen Reactions  . Clams [Shellfish Allergy] Anaphylaxis  . Hydralazine Other (See Comments)    Possible cause of chest discomfort  . Red Dye Other (See Comments)    Constipation     Patient Measurements: Height: 4\' 9"  (144.8 cm) Weight: 132 lb 0.9 oz (59.9 kg) IBW/kg (Calculated) : 38.6 Heparin Dosing Weight: 53 kg  Vital Signs: Temp: 98.5 F (36.9 C) (02/19 0304) Temp Source: Oral (02/19 0304) BP: 170/24 (02/19 0600) Pulse Rate: 62 (02/19 0600)  Labs: Recent Labs    12/25/17 0330  12/26/17 0136 12/26/17 0945 12/27/17 0418  HGB 10.7*  --  10.5*  --  9.4*  HCT 31.0*  --  31.0*  --  27.5*  PLT 178  --  247  --  268  HEPARINUNFRC 0.23*   < > 0.48 0.58 0.47  CREATININE 1.45*  --   --   --   --    < > = values in this interval not displayed.    Estimated Creatinine Clearance: 22.2 mL/min (A) (by C-G formula based on SCr of 1.45 mg/dL (H)).   Assessment: Patient's an 84 y.o F with hx CAD s/p CABG in 2008 and HTN presented to the ED on 12/18/17 with c/o leg cramps, n/v and abdominal pain.  Heparin drip started on 12/22/17 for elevated troponin.  Abd X-ray showed findings consistent with "a degree of persistent bowel obstruction." Cardiology team recommends to continue with heparin drip for now and to proceed with stress test when GI issues have resolved.  Today, 12/27/2017:  Heparin level is therapeutic at 0.47  Hgb is 9.4, plt 268. Hgb on low end but stable.   [facility] for [facility]. If abnormal, potentially [facility].   Goal of Therapy:  Heparin level 0.3-0.7 units/ml Monitor platelets by anticoagulation protocol: Yes   Plan:  - Continue heparin infusion to 700 units/hr - Check heparin level daily and CBC q48h minimum - monitor for s/s bleeding  Adalberto ColeNikola Shayle Donahoo, PharmD, BCPS Pager [phone] 12/27/2017 8:07  AM

## 2017-12-27 NOTE — Progress Notes (Signed)
To [facility] @ 0740 for stress test

## 2017-12-28 ENCOUNTER — Inpatient Hospital Stay (HOSPITAL_COMMUNITY): Payer: Medicare HMO

## 2017-12-28 DIAGNOSIS — I700 Atherosclerosis of aorta: Secondary | ICD-10-CM

## 2017-12-28 DIAGNOSIS — I2510 Atherosclerotic heart disease of native coronary artery without angina pectoris: Secondary | ICD-10-CM

## 2017-12-28 DIAGNOSIS — R1084 Generalized abdominal pain: Secondary | ICD-10-CM

## 2017-12-28 DIAGNOSIS — N179 Acute kidney failure, unspecified: Secondary | ICD-10-CM

## 2017-12-28 DIAGNOSIS — A084 Viral intestinal infection, unspecified: Principal | ICD-10-CM

## 2017-12-28 DIAGNOSIS — I10 Essential (primary) hypertension: Secondary | ICD-10-CM

## 2017-12-28 DIAGNOSIS — E039 Hypothyroidism, unspecified: Secondary | ICD-10-CM

## 2017-12-28 LAB — BASIC METABOLIC PANEL
Anion gap: 9 (ref 5–15)
BUN: 6 mg/dL (ref 6–20)
CO2: 22 mmol/L (ref 22–32)
CREATININE: 1.17 mg/dL — AB (ref 0.44–1.00)
Calcium: 9.1 mg/dL (ref 8.9–10.3)
Chloride: 108 mmol/L (ref 101–111)
GFR calc Af Amer: 49 mL/min — ABNORMAL LOW (ref >60)
GFR calc non Af Amer: 42 mL/min — ABNORMAL LOW (ref >60)
Glucose, Bld: 98 mg/dL (ref 65–99)
Potassium: 2.8 mmol/L — ABNORMAL LOW (ref 3.5–5.1)
Sodium: 139 mmol/L (ref 135–145)

## 2017-12-28 LAB — CBC
HEMATOCRIT: 29.9 % — AB (ref 36.0–46.0)
HEMOGLOBIN: 10.2 g/dL — AB (ref 12.0–15.0)
MCH: 30.7 pg (ref 26.0–34.0)
MCHC: 34.1 g/dL (ref 30.0–36.0)
MCV: 90.1 fL (ref 78.0–100.0)
Platelets: 291 10*3/uL (ref 150–400)
RBC: 3.32 MIL/uL — ABNORMAL LOW (ref 3.87–5.11)
RDW: 14.4 % (ref 11.5–15.5)
WBC: 9.4 10*3/uL (ref 4.0–10.5)

## 2017-12-28 MED ORDER — CLONIDINE HCL 0.1 MG PO TABS
0.1000 mg | ORAL_TABLET | Freq: Two times a day (BID) | ORAL | Status: DC
  Administered 2017-12-28 – 2017-12-29 (×2): 0.1 mg via ORAL
  Filled 2017-12-28 (×2): qty 1

## 2017-12-28 NOTE — Progress Notes (Signed)
  Echocardiogram 2D Echocardiogram has been performed.  Celene SkeenVijay  Neizan Debruhl 12/28/2017, 10:24 AM

## 2017-12-28 NOTE — Care Management Important Message (Signed)
Important Message  Patient Details  Name: Katie MorinMary Agnes Richner MRN: [id number] Date of Birth: [birth year]   Medicare Important Message Given:  Yes    Caren MacadamFuller, Lilliane Sposito 12/28/2017, 12:40 PMImportant Message  Patient Details  Name: Katie MorinMary Agnes Yepiz MRN: [id number] Date of Birth: [birth year]   Medicare Important Message Given:  Yes    Caren MacadamFuller, Emberlee Sortino 12/28/2017, 12:40 PM

## 2017-12-28 NOTE — Progress Notes (Signed)
[facility]  PROGRESS NOTE  Katie [name] WNU:[id number] DOB: 03-11-1933 DOA: 12/18/2017 PCP: Macy Mis, MD   Brief HPI:   84 y.o.femalewitha history of CAD s/p CABG x3 2008, partial colectomy for invasive polyp and take back for postop infection remotely, HTN, hypothyroidism, HLD, and COPD who presented to the ED after she awoke with leg cramping and noted significant nausea, vomiting and generalized abdominal pain since last evening. This has happened sporadically for a few weeks, but was more severe than previously and associated with loose stools and abdominal bloating. She was hypertensive and afebrile in the ED with continued vomiting, AKI, leukocytosis and ketonuria. CT showed evidence of small bowel dysmotility, but no obstruction. It also noted diffuse aortic atherosclerosis and severe infrarenal luminal narrowing. Admission was requested for dehydration, AKI due to intractable nausea and vomiting due to presumed viral gastroenteritis.  12/24/2017-multiple episodes of diarrhea.kub-Unchanged small bowel dilatation compatible with high-grade partial or early small obstruction.   2/18 pt taking some clears Gen surgery signing off  2/19 Pt taken to [facility] for  Cardiac stress testing. Was transitioned back to [facility] after stress testing       Subjective   Patient seen and examined, denies vomiting or diarrhea. No abdominal pain.   Assessment/Plan:     1. Gastroenteritis-CT abdomen showed dysmotility, no obstruction. Surgery was consulted and assigned of. Patient tolerating diet well. 2. Acute kidney injury- elevation of serum creatinine likely from dehydration. BUN/creatinine has improved. Check BMP today 3. Aortic atherosclerosis with infrarenal luminal narrowing-vascular surgery was consulted, Dr Randie Heinz  feels that patient ABI showing severe PAD. He will follow patient in the clinic as outpatient. 4. CAD status post CABG times three-patient was seen by cardiology  underwent nuclear stress testing which was low risk. Patient denies any chest pain. Cardiology has signed off. Repeat echo was done today to check aortic valve. Result is currently pending. 5. Hypertension- continue amlodipine, ACE inhibitor ARB to be avoided as per cardiology. Continue clonidine,Bidill, Cardizem. 6. Hypothyroidism-stable, continue Synthroid    DVT prophylaxis: heparin  Code Status: full code  Family Communication: discussed with patient's husband at bedside  Disposition Plan: likely home in a.m.   Consultants:  Gastroenterology  general surgery    Procedures: *Nuclear stress test 12/27/2017: IMPRESSION: 1. Moderate to large size matched defect compatible with scar in the anterior wall extending from the cardiac apex to the mid heart. Small matched defect in the lateral wall at the cardiac base. No inducible ischemia.  2. Mild septal hypokinesis.  3. Left ventricular ejection fraction 58%  4. Non invasive risk stratification*: Low      Continuous infusions . sodium chloride Stopped (12/27/17 1900)      Antibiotics:   Anti-infectives (From admission, onward)   Start     Dose/Rate Route Frequency Ordered Stop   12/24/17 1400  ciprofloxacin (CIPRO) IVPB 200 mg  Status:  Discontinued     200 mg 100 mL/hr over 60 Minutes Intravenous Every 12 hours 12/24/17 1224 12/28/17 0950   12/24/17 1400  metroNIDAZOLE (FLAGYL) IVPB 500 mg  Status:  Discontinued     500 mg 100 mL/hr over 60 Minutes Intravenous Every 8 hours 12/24/17 1224 12/28/17 0951       Objective   Vitals:   12/28/17 0000 12/28/17 0122 12/28/17 0548 12/28/17 1356  BP: (!) 168/29 (!) 165/43 (!) 165/48 (!) 151/47  Pulse: 66 64 66 75  Resp: 20 20 20 18   Temp:  98.4 F (36.9 C)  99 F (37.2 C) 98.2 F (36.8 C)  TempSrc:  Oral Oral Oral  SpO2: 94% 99% 98% 100%  Weight:  64.4 kg (141 lb 14.4 oz)    Height:  4\' 11"  (1.499 m)      Intake/Output Summary (Last 24 hours) at  12/28/2017 1703 Last data filed at 12/28/2017 1200 Gross per 24 hour  Intake 1150 ml  Output 400 ml  Net 750 ml   Filed Weights   12/24/17 0356 12/24/17 2337 12/28/17 0122  Weight: 63.1 kg (139 lb 1.8 oz) 59.9 kg (132 lb 0.9 oz) 64.4 kg (141 lb 14.4 oz)     Physical Examination:   Physical Exam: Eyes: No icterus, extraocular muscles intact  Mouth: Oral mucosa is moist, no lesions on palate,  Neck: Supple, no deformities, masses, or tenderness Lungs: Normal respiratory effort, bilateral clear to auscultation, no crackles or wheezes.  Heart: Regular rate and rhythm, S1 and S2 normal, no murmurs, rubs auscultated Abdomen: BS normoactive,soft,nondistended,non-tender to palpation,no organomegaly Extremities: No pretibial edema, no erythema, no cyanosis, no clubbing Neuro : Alert and oriented to time, place and person, No focal deficits  Skin: No rashes seen on exam       Data Reviewed: I have personally reviewed following labs and imaging studies  CBG: No results for input(s): GLUCAP in the last 168 hours.  CBC: Recent Labs  Lab 12/24/17 0245 12/25/17 0330 12/26/17 0136 12/27/17 0418 12/28/17 0611  WBC 5.4 7.1 10.4 10.2 9.4  HGB 11.1* 10.7* 10.5* 9.4* 10.2*  HCT 32.0* 31.0* 31.0* 27.5* 29.9*  MCV 88.6 89.3 89.9 90.2 90.1  PLT 237 178 247 268 291    Basic Metabolic Panel: Recent Labs  Lab 12/22/17 0408 12/23/17 0128 12/24/17 0245 12/25/17 0330  NA 135 137 137 139  K 3.6 3.7 3.1* 3.3*  CL 110 109 108 109  CO2 16* 17* 19* 21*  GLUCOSE 71 89 108* 111*  BUN 15 13 13 11   CREATININE 1.32* 1.36* 1.55* 1.45*  CALCIUM 8.4* 9.3 9.0 9.2    Recent Results (from the past 240 hour(s))  Gastrointestinal Panel by PCR , Stool     Status: None   Collection Time: 12/20/17  4:35 PM  Result Value Ref Range Status   Campylobacter species NOT DETECTED NOT DETECTED Final   Plesimonas shigelloides NOT DETECTED NOT DETECTED Final   Salmonella species NOT DETECTED NOT  DETECTED Final   Yersinia enterocolitica NOT DETECTED NOT DETECTED Final   Vibrio species NOT DETECTED NOT DETECTED Final   Vibrio cholerae NOT DETECTED NOT DETECTED Final   Enteroaggregative E coli (EAEC) NOT DETECTED NOT DETECTED Final   Enteropathogenic E coli (EPEC) NOT DETECTED NOT DETECTED Final   Enterotoxigenic E coli (ETEC) NOT DETECTED NOT DETECTED Final   Shiga like toxin producing E coli (STEC) NOT DETECTED NOT DETECTED Final   Shigella/Enteroinvasive E coli (EIEC) NOT DETECTED NOT DETECTED Final   Cryptosporidium NOT DETECTED NOT DETECTED Final   Cyclospora cayetanensis NOT DETECTED NOT DETECTED Final   Entamoeba histolytica NOT DETECTED NOT DETECTED Final   Giardia lamblia NOT DETECTED NOT DETECTED Final   Adenovirus F40/41 NOT DETECTED NOT DETECTED Final   Astrovirus NOT DETECTED NOT DETECTED Final   Norovirus GI/GII NOT DETECTED NOT DETECTED Final   Rotavirus A NOT DETECTED NOT DETECTED Final   Sapovirus (I, II, IV, and V) NOT DETECTED NOT DETECTED Final    Comment: Performed at [facility], [address], [place], [place] [address]  C difficile  quick scan w PCR reflex     Status: None   Collection Time: 12/21/17  5:34 PM  Result Value Ref Range Status   C Diff antigen NEGATIVE NEGATIVE Final   C Diff toxin NEGATIVE NEGATIVE Final   C Diff interpretation No C. difficile detected.  Final    Comment: Performed at [facility], [address] [place], [place] [address]  MRSA PCR Screening     Status: None   Collection Time: 12/23/17  4:19 PM  Result Value Ref Range Status   MRSA by PCR NEGATIVE NEGATIVE Final    Comment:        The GeneXpert MRSA Assay (FDA approved for NASAL specimens only), is one component of a comprehensive MRSA colonization surveillance program. It is not intended to diagnose MRSA infection nor to guide or monitor treatment for MRSA infections. Performed at [facility], [address]  [place], [place] [address]      Liver Function Tests: Recent Labs  Lab 12/22/17 0408 12/24/17 0245  AST 29 29  ALT 17 22  ALKPHOS 32* 37*  BILITOT 0.6 0.8  PROT 5.9* 6.4*  ALBUMIN 2.8* 3.1*   No results for input(s): LIPASE, AMYLASE in the last 168 hours. No results for input(s): AMMONIA in the last 168 hours.  Cardiac Enzymes: Recent Labs  Lab 12/21/17 2224 12/22/17 0408 12/22/17 0955 12/22/17 1512 12/22/17 2108  TROPONINI 0.06* 0.14* 0.11* 0.08* 0.07*   BNP (last 3 results) No results for input(s): BNP in the last 8760 hours.  ProBNP (last 3 results) No results for input(s): PROBNP in the last 8760 hours.    Studies: Nm Myocar Multi W/planar W/wall Motion / Ef  Result Date: 12/27/2017 CLINICAL DATA:  Myocardial infarction. Prior CABG. Hypertension. Congestive heart failure and coronary artery disease. EXAM: MYOCARDIAL IMAGING WITH SPECT (REST AND EXERCISE) GATED LEFT VENTRICULAR WALL MOTION STUDY LEFT VENTRICULAR EJECTION FRACTION TECHNIQUE: Standard myocardial SPECT imaging was performed after resting intravenous injection of 10 mCi Tc-2721m tetrofosmin. Subsequently, exercise tolerance test was performed by the patient under the supervision of the Cardiology staff. At peak-stress, 30 mCi Tc-4721m tetrofosmin was injected intravenously and standard myocardial SPECT imaging was performed. Quantitative gated imaging was also performed to evaluate left ventricular wall motion, and estimate left ventricular ejection fraction. COMPARISON:  None FINDINGS: Perfusion: In the anterior left ventricular wall there is a matched defect on stress and rest images extending from the apex to the mid heart, overall moderate to large in size. Matched reduced activity in the lateral wall at the cardiac base is relatively small. No inducible ischemia. Wall Motion: Mild septal hypokinesis Left Ventricular Ejection Fraction: 58 % End diastolic volume 88 ml End systolic volume 36 ml IMPRESSION: 1. Moderate  to large size matched defect compatible with scar in the anterior wall extending from the cardiac apex to the mid heart. Small matched defect in the lateral wall at the cardiac base. No inducible ischemia. 2. Mild septal hypokinesis. 3. Left ventricular ejection fraction 58% 4. Non invasive risk stratification*: Low *2012 Appropriate Use Criteria for Coronary Revascularization Focused Update: J Am Coll Cardiol. 2012;59(9):857-881. http://content.dementiazones.comonlinejacc.org/article.aspx?articleid=1201161 Electronically Signed   By: Gaylyn RongWalter  Liebkemann M.D.   On: 12/27/2017 12:35    Scheduled Meds: . amLODipine  5 mg Oral Daily  . cloNIDine  0.2 mg Transdermal Weekly  . diltiazem  240 mg Oral Daily  . feeding supplement (ENSURE ENLIVE)  237 mL Oral BID BM  . isosorbide-hydrALAZINE  1 tablet Oral  TID  . mouth rinse  15 mL Mouth Rinse BID      Time spent: 25 min  Meredeth Ide   [facility] Pager [phone]. If 7PM-7AM, please contact night-coverage at [email or web address], Office  [phone]  password [email or web address]  12/28/2017, 5:03 PM  LOS: 9 days

## 2017-12-29 LAB — CBC
HCT: 30.1 % — ABNORMAL LOW (ref 36.0–46.0)
HEMOGLOBIN: 10.2 g/dL — AB (ref 12.0–15.0)
MCH: 30.7 pg (ref 26.0–34.0)
MCHC: 33.9 g/dL (ref 30.0–36.0)
MCV: 90.7 fL (ref 78.0–100.0)
PLATELETS: 324 10*3/uL (ref 150–400)
RBC: 3.32 MIL/uL — ABNORMAL LOW (ref 3.87–5.11)
RDW: 14.4 % (ref 11.5–15.5)
WBC: 8.5 10*3/uL (ref 4.0–10.5)

## 2017-12-29 LAB — ECHOCARDIOGRAM COMPLETE
Height: 59 in
Weight: 2270.4 oz

## 2017-12-29 LAB — BASIC METABOLIC PANEL
Anion gap: 9 (ref 5–15)
BUN: 6 mg/dL (ref 6–20)
CALCIUM: 8.7 mg/dL — AB (ref 8.9–10.3)
CO2: 22 mmol/L (ref 22–32)
Chloride: 108 mmol/L (ref 101–111)
Creatinine, Ser: 1.03 mg/dL — ABNORMAL HIGH (ref 0.44–1.00)
GFR, EST AFRICAN AMERICAN: 57 mL/min — AB (ref >60)
GFR, EST NON AFRICAN AMERICAN: 49 mL/min — AB (ref >60)
Glucose, Bld: 102 mg/dL — ABNORMAL HIGH (ref 65–99)
Potassium: 3.4 mmol/L — ABNORMAL LOW (ref 3.5–5.1)
SODIUM: 139 mmol/L (ref 135–145)

## 2017-12-29 MED ORDER — POTASSIUM CHLORIDE 10 MEQ/100ML IV SOLN
10.0000 meq | INTRAVENOUS | Status: AC
  Administered 2017-12-29 (×4): 10 meq via INTRAVENOUS
  Filled 2017-12-29 (×4): qty 100

## 2017-12-29 MED ORDER — AMLODIPINE BESYLATE 5 MG PO TABS: 5.0000 mg | ORAL_TABLET | Freq: Every day | ORAL | 2 refills | Status: DC

## 2017-12-29 MED ORDER — CLONIDINE HCL 0.1 MG PO TABS: 0.1000 mg | ORAL_TABLET | Freq: Two times a day (BID) | ORAL | 11 refills | Status: DC

## 2017-12-29 MED ORDER — FAMOTIDINE 20 MG PO TABS
20.0000 mg | ORAL_TABLET | Freq: Every day | ORAL | Status: DC
  Administered 2017-12-29: 20 mg via ORAL
  Filled 2017-12-29: qty 1

## 2017-12-29 MED ORDER — POTASSIUM CHLORIDE CRYS ER 20 MEQ PO TBCR
20.0000 meq | EXTENDED_RELEASE_TABLET | Freq: Once | ORAL | Status: AC
  Administered 2017-12-29: 20 meq via ORAL
  Filled 2017-12-29: qty 1

## 2017-12-29 MED ORDER — ISOSORB DINITRATE-HYDRALAZINE 20-37.5 MG PO TABS: 1.0000 | ORAL_TABLET | Freq: Three times a day (TID) | ORAL | 2 refills | Status: DC

## 2017-12-29 NOTE — Progress Notes (Signed)
Patient given discharge instructions, and verbalized an understanding of all discharge instructions.  Patient agrees with discharge plan, and is being discharged in stable medical condition.  Patient unable to afford bidil, so Dr. Sharl MaLama notified, with plan to send prescription for isosorbide, and hydralazine to pharmacy.  Patient notified to follow up with cardiologist.  Patient given transportation via wheelchair.

## 2017-12-29 NOTE — Progress Notes (Signed)
Physician Discharge Summary  Katie [name] UEA:[id number] DOB: Nov 22, 1932 DOA: 12/18/2017  PCP: Macy Mis, MD  Admit date: 12/18/2017 Discharge date: 12/29/2017  Time spent: *45 minutes  Recommendations for Outpatient Follow-up:  1. Follow up PCP in 2 weeks   Discharge Diagnoses:  Principal Problem:   Viral gastroenteritis Active Problems:   AKI (acute kidney injury) (HCC)   Aortic atherosclerosis (HCC)   CAD (coronary artery disease)   HTN (hypertension)   Hypothyroidism   UTI (urinary tract infection)   Generalized abdominal pain   Nausea and vomiting   Generalized abdominal tenderness   Chest pain   Discharge Condition: Stable  Diet recommendation: heart healthy diet  Filed Weights   12/24/17 0356 12/24/17 2337 12/28/17 0122  Weight: 63.1 kg (139 lb 1.8 oz) 59.9 kg (132 lb 0.9 oz) 64.4 kg (141 lb 14.4 oz)    History of present illness:  84 y.o.femalewitha history of CAD s/p CABG x3 2008, partial colectomy for invasive polyp and take back for postop infection remotely, HTN, hypothyroidism, HLD, and COPD who presented to the ED after she awoke with leg cramping and noted significant nausea, vomiting and generalized abdominal pain since last evening. This has happened sporadically for a few weeks, but was more severe than previously and associated with loose stools and abdominal bloating. She was hypertensive and afebrile in the ED with continued vomiting, AKI, leukocytosis and ketonuria. CT showed evidence of small bowel dysmotility, but no obstruction. It also noted diffuse aortic atherosclerosis and severe infrarenal luminal narrowing. Admission was requested for dehydration, AKI due to intractable nausea and vomiting due to presumed viral gastroenteritis.  12/24/2017-multiple episodes of diarrhea.kub-Unchanged small bowel dilatation compatible with high-grade partial or early small obstruction.   2/18 pt taking some clears Gen surgery signing off  2/19 Pt taken  to [facility] for  Cardiac stress testing. Was transitioned back to [facility] after stress testing     Hospital Course:  1. Gastroenteritis-CT abdomen showed dysmotility, no obstruction. Surgery was consulted and signed off. Patient tolerating diet well. 2. Acute kidney injury- elevation of serum creatinine likely from dehydration. BUN/creatinine has improved.  3. Aortic atherosclerosis with infrarenal luminal narrowing-vascular surgery was consulted, Dr Randie Heinz  feels that patient ABI showing severe PAD. He will follow patient in the clinic as outpatient. 4. CAD status post CABG times three-patient was seen by cardiology underwent nuclear stress testing which was low risk. Patient denies any chest pain. Cardiology has signed off. Repeat echo was done today to check aortic valve. Result is currently pending. 5. Hypertension- continue amlodipine, ACE inhibitor ARB to be avoided as per cardiology. Continue clonidine,Bidill, Cardizem. Also added low dose Amlodipine as per cardiology recommendation. 6. Hypothyroidism-stable, continue Synthroid     Procedures:  None   Consultations:  Cardiology  Vascular surgery  Discharge Exam: Vitals:   12/29/17 0512 12/29/17 1340  BP: (!) 160/49 (!) 150/55  Pulse: 76 75  Resp: (!) 23 20  Temp: 99 F (37.2 C) 98.7 F (37.1 C)  SpO2: 96% 94%    General: Appears in no acute distress Cardiovascular: S1S2 RRR Respiratory: Clear bilaterally  Discharge Instructions   Discharge Instructions    Diet - low sodium heart healthy   Complete by:  As directed    Diet - low sodium heart healthy   Complete by:  As directed    Increase activity slowly   Complete by:  As directed    Increase activity slowly   Complete by:  As directed  Allergies as of 12/29/2017      Reactions   Clams [shellfish Allergy] Anaphylaxis   Hydralazine Other (See Comments)   Possible cause of chest discomfort   Red Dye Other (See Comments)   Constipation       Medication List    STOP taking these medications   irbesartan-hydrochlorothiazide 150-12.5 MG tablet Commonly known as:  AVALIDE   spironolactone 25 MG tablet Commonly known as:  ALDACTONE     TAKE these medications   ALIVE WOMENS 50+ Tabs Take 1 tablet by mouth daily.   amLODipine 5 MG tablet Commonly known as:  NORVASC Take 1 tablet (5 mg total) by mouth daily.   aspirin 81 MG chewable tablet Chew 81 mg by mouth daily.   cloNIDine 0.1 MG tablet Commonly known as:  CATAPRES Take 1 tablet (0.1 mg total) by mouth 2 (two) times daily.   diltiazem 240 MG 24 hr capsule Commonly known as:  CARDIZEM CD Take 240 mg by mouth daily.   isosorbide-hydrALAZINE 20-37.5 MG tablet Commonly known as:  BIDIL Take 1 tablet by mouth 3 (three) times daily.   levothyroxine 50 MCG tablet Commonly known as:  SYNTHROID, LEVOTHROID Take 50 mcg by mouth daily.   montelukast 5 MG chewable tablet Commonly known as:  SINGULAIR Chew 5 mg by mouth at bedtime.   pravastatin 40 MG tablet Commonly known as:  PRAVACHOL Take 40 mg by mouth daily.   vitamin B-12 100 MCG tablet Commonly known as:  CYANOCOBALAMIN Take 1,000 mcg by mouth daily.   Vitamin D-3 1000 units Caps Take 1,000 Units by mouth daily.      Allergies  Allergen Reactions  . Clams [Shellfish Allergy] Anaphylaxis  . Hydralazine Other (See Comments)    Possible cause of chest discomfort  . Red Dye Other (See Comments)    Constipation    Follow-up Information    Macy Mis, MD Follow up in 2 week(s).   Specialty:  Family Medicine Contact information: [address] [place] [address] [phone]            The results of significant diagnostics from this hospitalization (including imaging, microbiology, ancillary and laboratory) are listed below for reference.    Significant Diagnostic Studies: Ct Abdomen Pelvis Wo Contrast  Result Date: 12/18/2017 CLINICAL DATA:  Acute onset of  vomiting and generalized abdominal pain. EXAM: CT ABDOMEN AND PELVIS WITHOUT CONTRAST TECHNIQUE: Multidetector CT imaging of the abdomen and pelvis was performed following the standard protocol without IV contrast. COMPARISON:  None. FINDINGS: Lower chest: Minimal left basilar atelectasis or scarring is noted. Diffuse coronary artery calcifications are seen. The patient is status post median sternotomy. Hepatobiliary: Scattered nonspecific hypodensities within the liver measure up to 2.5 cm in size. The liver is otherwise unremarkable. The gallbladder is grossly unremarkable. The common bile duct remains normal in size. Pancreas: The pancreas is within normal limits. Spleen: Calcification at the spleen likely reflects remote traumatic injury. Adrenals/Urinary Tract: The adrenal glands are unremarkable in appearance. Vascular calcifications are seen at the renal hila bilaterally, and along both renal arteries. Nonspecific perinephric stranding is noted bilaterally. There is no evidence of hydronephrosis. A left renal cyst is noted. No obstructing ureteral stones are seen. Stomach/Bowel: There is mild distention and gradual decompression of small bowel loops, which may reflect mild dysmotility. The ileocolic anastomosis at the left mid abdomen is unremarkable in appearance. The remaining colon is largely decompressed and grossly unremarkable. The stomach is partially filled with  fluid and is unremarkable in appearance. Vascular/Lymphatic: Diffuse calcification is seen along the abdominal aorta and its branches, with severe luminal narrowing noted at the infrarenal abdominal aorta. No retroperitoneal or pelvic sidewall lymphadenopathy is seen. Reproductive: The bladder is mildly distended and grossly unremarkable. The uterus is unremarkable. The ovaries are relatively symmetric. No suspicious adnexal masses are seen. Other: No additional soft tissue abnormalities are seen. Musculoskeletal: No acute osseous  abnormalities are identified. Multilevel vacuum phenomenon is noted along the lumbar spine, with endplate sclerotic change and facet disease. The visualized musculature is unremarkable in appearance. IMPRESSION: 1. Mild distention and gradual decompression of small bowel loops, which may reflect mild small bowel dysmotility. No evidence of bowel obstruction. Ileocolic anastomosis is unremarkable in appearance 2. Diffuse aortic atherosclerosis, with severe luminal narrowing at the infrarenal abdominal aorta. 3. Diffuse coronary artery calcifications. 4. Scattered nonspecific hypodensities within the liver measure up to 2.5 cm in size. 5. Small left renal cyst. 6. Degenerative change along the lumbar spine. Aortic Atherosclerosis (ICD10-I70.0). Electronically Signed   By: Roanna RaiderJeffery  Chang M.D.   On: 12/18/2017 04:13   Dg Abd 1 View  Result Date: 12/25/2017 CLINICAL DATA:  Assessment for small bowel obstruction. 24 hour delayed film. EXAM: ABDOMEN - 1 VIEW COMPARISON:  Radiograph yesterday at 0133 hour FINDINGS: Administered enteric contrast has dissipated and is seen primarily throughout small bowel. Patient has had ileal colonic anastomosis in the transverse colon, and enteric contrast does appear to reach the splenic flexure. Persistent gaseous small bowel distention in the central abdomen. IMPRESSION: Probable administered enteric contrast in the splenic flexure of the colon. Additional enteric contrast has dissipated and is seen in throughout small bowel. Electronically Signed   By: Rubye OaksMelanie  Ehinger M.D.   On: 12/25/2017 01:59   Dg Abd 1 View  Result Date: 12/23/2017 CLINICAL DATA:  Abdominal pain and nausea. Recent bowel obstruction. EXAM: ABDOMEN - 1 VIEW COMPARISON:  December 22, 2017 FINDINGS: Nasogastric tube tip and side port in stomach. There are multiple loops of dilated small bowel. There is also a moderate air in the stomach. There is modest air in the colon. No appreciable air-fluid levels. No  free air. There are iliac artery calcifications bilaterally. Lung bases are clear. IMPRESSION: Findings most consistent with a degree of persistent bowel obstruction, similar to 1 day prior. No free air. Nasogastric tube tip and side port in stomach. There is iliac artery atherosclerotic calcification bilaterally. Electronically Signed   By: Bretta BangWilliam  Woodruff III M.D.   On: 12/23/2017 12:27   Dg Abd 1 View  Result Date: 12/22/2017 CLINICAL DATA:  Follow-up small bowel obstruction.  Abdominal pain. EXAM: ABDOMEN - 1 VIEW COMPARISON:  12/21/2017 FINDINGS: Multiple mildly dilated small bowel loops show no significant change. There is a relative paucity of colonic gas. These findings are consistent with mid to distal small bowel obstruction, and without significant change. IMPRESSION: Mid to distal small bowel obstruction, without significant change. Electronically Signed   By: Myles RosenthalJohn  Stahl M.D.   On: 12/22/2017 07:21   Nm Myocar Multi W/planar Izetta DakinW/wall Motion / Ef  Result Date: 12/27/2017 CLINICAL DATA:  Myocardial infarction. Prior CABG. Hypertension. Congestive heart failure and coronary artery disease. EXAM: MYOCARDIAL IMAGING WITH SPECT (REST AND EXERCISE) GATED LEFT VENTRICULAR WALL MOTION STUDY LEFT VENTRICULAR EJECTION FRACTION TECHNIQUE: Standard myocardial SPECT imaging was performed after resting intravenous injection of 10 mCi Tc-3428m tetrofosmin. Subsequently, exercise tolerance test was performed by the patient under the supervision of the Cardiology staff. At peak-stress,  30 mCi Tc-39m tetrofosmin was injected intravenously and standard myocardial SPECT imaging was performed. Quantitative gated imaging was also performed to evaluate left ventricular wall motion, and estimate left ventricular ejection fraction. COMPARISON:  None FINDINGS: Perfusion: In the anterior left ventricular wall there is a matched defect on stress and rest images extending from the apex to the mid heart, overall moderate to  large in size. Matched reduced activity in the lateral wall at the cardiac base is relatively small. No inducible ischemia. Wall Motion: Mild septal hypokinesis Left Ventricular Ejection Fraction: 58 % End diastolic volume 88 ml End systolic volume 36 ml IMPRESSION: 1. Moderate to large size matched defect compatible with scar in the anterior wall extending from the cardiac apex to the mid heart. Small matched defect in the lateral wall at the cardiac base. No inducible ischemia. 2. Mild septal hypokinesis. 3. Left ventricular ejection fraction 58% 4. Non invasive risk stratification*: Low *2012 Appropriate Use Criteria for Coronary Revascularization Focused Update: J Am Coll Cardiol. 2012;59(9):857-881. http://content.dementiazones.com.aspx?articleid=[email or web address] Electronically Signed   By: Gaylyn Rong M.D.   On: 12/27/2017 12:35   Dg Abd 2 Views  Result Date: 12/21/2017 CLINICAL DATA:  Abdominal pain with nausea, vomiting, and diarrhea. Previous right hemicolectomy. EXAM: ABDOMEN - 2 VIEW COMPARISON:  CT scans dated 12/18/2017 and 12/20/2017 FINDINGS: There are several distended small bowel loops with air-fluid levels on the upright radiograph including within the nondistended left side of the colon. No free air. Extensive vascular calcifications.  No acute bone abnormality. IMPRESSION: The number of distended small bowel loops has diminished since the prior CT scan. Finding is consistent with improving partial small bowel obstruction. Electronically Signed   By: Francene Boyers M.D.   On: 12/21/2017 10:15   Dg Abd Portable 1v-small Bowel Obstruction Protocol-initial, 8 Hr Delay  Result Date: 12/24/2017 CLINICAL DATA:  Small-bowel obstruction, 8 hour delayed image. EXAM: PORTABLE ABDOMEN - 1 VIEW COMPARISON:  12/23/2017 at 1159 hours FINDINGS: Enteric contrast is seen within the stomach with gastric tube in place. Persistent unchanged dilatation of small bowel loops compatible small bowel obstruction  is identified. Flocculent contrast within the stomach is noted without antegrade progression. IMPRESSION: Unchanged small bowel dilatation compatible with high-grade partial or early small obstruction. Contrast remains within the stomach at 8 hours from the start of imaging. Electronically Signed   By: Tollie Eth M.D.   On: 12/24/2017 01:59   Ct Angio Abd/pel W/ And/or W/o  Result Date: 12/20/2017 CLINICAL DATA:  Generalized abdominal pain for 3 weeks. Evaluate for ischemic bowel. EXAM: CTA ABDOMEN AND PELVIS wITHOUT AND WITH CONTRAST TECHNIQUE: Multidetector CT imaging of the abdomen and pelvis was performed using the standard protocol during bolus administration of intravenous contrast. Multiplanar reconstructed images and MIPs were obtained and reviewed to evaluate the vascular anatomy. CONTRAST:  80mL ISOVUE-370 IOPAMIDOL (ISOVUE-370) INJECTION 76% COMPARISON:  12/18/2017 FINDINGS: VASCULAR Aorta: Diffuse atherosclerotic calcifications and plaque in the abdominal aorta without aneurysmal dilatation. Celiac: There is significant narrowing at the origin with atherosclerotic calcification. Accessory left hepatic artery anatomy. SMA: Diffuse atherosclerotic soft and calcified plaque. No definite significant narrowing. Renals: Single left renal artery is patent with extensive atherosclerotic calcified and soft plaque in both renal arteries. There is extensive atherosclerotic calcification at the origin of the right renal artery. Significant narrowing cannot be excluded. IMA: The vessel is severely diminutive. There is significant disease at the origin. Inflow: There are diffuse atherosclerotic calcifications throughout the bilateral common and external iliac arteries.There is severe disease  involving the proximal internal iliac arteries bilaterally. Proximal Outflow: Right common femoral artery is patent. The proximal superficial femoral artery on the right is severely diseased. The left common femoral artery is  patent. The left superficial femoral artery is occluded. Veins: No obvious DVT. Review of the MIP images confirms the above findings. NON-VASCULAR Lower chest: There is dependent atelectasis at the right lung base. There is atelectasis versus airspace disease at the left lung base which has increased since the prior study. Hepatobiliary: Several liver cysts are stable. The left lobe of the liver is somewhat lobulated of unknown significance. Gallbladder is within normal limits. Pancreas: Unremarkable Spleen: There is a small calcified lesion at the upper anterior spleen of unknown significance. Adrenals/Urinary Tract: No evidence of renal mass or hydronephrosis. There is a cyst in the mid left kidney. Adrenal glands are unremarkable. There is a noticeable diminished opacification of the right renal parenchyma on arterial phase suggesting arterial insufficiency and significant narrowing in the right renal artery. Bladder is decompressed. Stomach/Bowel: The stomach is decompressed. There are several small bowel loops which are moderately dilated with air-fluid levels. Distal small bowel loops in the mid abdomen adjacent to the anastomosis to transverse colon are decompressed. The colon is decompressed. The patient is status post right hemicolectomy. No obvious mass in the colon. Lymphatic: No abnormal retroperitoneal adenopathy. Reproductive: Uterus and adnexa are within normal limits. Other: There is a small amount of free fluid layering in the pelvis, in the left paracolic gutter, and about the liver. Musculoskeletal: No vertebral compression deformity. Advanced degenerative disc disease in the lumbar spine. IMPRESSION: VASCULAR There is extensive atherosclerotic calcification and plaque in the aorta without aneurysmal dilatation. There may be significant narrowing at the origin of the celiac axis and IMA, but there is no significant narrowing involving the SMA. There is plaque involving the SMA which is scattered.  There is no evidence of acute thrombosis or vessel occlusion in the mesenteric vasculature. Chronic mesenteric ischemia is a concern and is associated with weight loss and chronic postprandial pain. Significant narrowing at the origin of the right renal artery. NON-VASCULAR There are moderately dilated small bowel loops as well as decompressed distal small bowel loops suggesting an early or partial small bowel obstruction pattern. The patient is status post right hemicolectomy. Colon is decompressed. There is free fluid in the abdomen suggesting an inflammatory process. Increasing bibasilar atelectasis. More confluent pulmonary density at the left base may represent developing consolidation. Electronically Signed   By: Jolaine Click M.D.   On: 12/20/2017 15:35    Microbiology: Recent Results (from the past 240 hour(s))  Gastrointestinal Panel by PCR , Stool     Status: None   Collection Time: 12/20/17  4:35 PM  Result Value Ref Range Status   Campylobacter species NOT DETECTED NOT DETECTED Final   Plesimonas shigelloides NOT DETECTED NOT DETECTED Final   Salmonella species NOT DETECTED NOT DETECTED Final   Yersinia enterocolitica NOT DETECTED NOT DETECTED Final   Vibrio species NOT DETECTED NOT DETECTED Final   Vibrio cholerae NOT DETECTED NOT DETECTED Final   Enteroaggregative E coli (EAEC) NOT DETECTED NOT DETECTED Final   Enteropathogenic E coli (EPEC) NOT DETECTED NOT DETECTED Final   Enterotoxigenic E coli (ETEC) NOT DETECTED NOT DETECTED Final   Shiga like toxin producing E coli (STEC) NOT DETECTED NOT DETECTED Final   Shigella/Enteroinvasive E coli (EIEC) NOT DETECTED NOT DETECTED Final   Cryptosporidium NOT DETECTED NOT DETECTED Final   Cyclospora cayetanensis NOT  DETECTED NOT DETECTED Final   Entamoeba histolytica NOT DETECTED NOT DETECTED Final   Giardia lamblia NOT DETECTED NOT DETECTED Final   Adenovirus F40/41 NOT DETECTED NOT DETECTED Final   Astrovirus NOT DETECTED NOT DETECTED  Final   Norovirus GI/GII NOT DETECTED NOT DETECTED Final   Rotavirus A NOT DETECTED NOT DETECTED Final   Sapovirus (I, II, IV, and V) NOT DETECTED NOT DETECTED Final    Comment: Performed at [facility], [address] [place], [place] [address]  C difficile quick scan w PCR reflex     Status: None   Collection Time: 12/21/17  5:34 PM  Result Value Ref Range Status   C Diff antigen NEGATIVE NEGATIVE Final   C Diff toxin NEGATIVE NEGATIVE Final   C Diff interpretation No C. difficile detected.  Final    Comment: Performed at [facility], [address] [place], [place] [address]  MRSA PCR Screening     Status: None   Collection Time: 12/23/17  4:19 PM  Result Value Ref Range Status   MRSA by PCR NEGATIVE NEGATIVE Final    Comment:        The GeneXpert MRSA Assay (FDA approved for NASAL specimens only), is one component of a comprehensive MRSA colonization surveillance program. It is not intended to diagnose MRSA infection nor to guide or monitor treatment for MRSA infections. Performed at [facility], [address] [place], [place] [address]      Labs: Basic Metabolic Panel: Recent Labs  Lab 12/23/17 0128 12/24/17 0245 12/25/17 0330 12/28/17 1733 12/29/17 1538  NA 137 137 139 139 139  K 3.7 3.1* 3.3* 2.8* 3.4*  CL 109 108 109 108 108  CO2 17* 19* 21* 22 22  GLUCOSE 89 108* 111* 98 102*  BUN 13 13 11 6 6   CREATININE 1.36* 1.55* 1.45* 1.17* 1.03*  CALCIUM 9.3 9.0 9.2 9.1 8.7*   Liver Function Tests: Recent Labs  Lab 12/24/17 0245  AST 29  ALT 22  ALKPHOS 37*  BILITOT 0.8  PROT 6.4*  ALBUMIN 3.1*   No results for input(s): LIPASE, AMYLASE in the last 168 hours. No results for input(s): AMMONIA in the last 168 hours. CBC: Recent Labs  Lab 12/25/17 0330 12/26/17 0136 12/27/17 0418 12/28/17 0611 12/29/17 0555  WBC 7.1 10.4 10.2 9.4 8.5  HGB 10.7* 10.5* 9.4* 10.2* 10.2*  HCT 31.0* 31.0* 27.5* 29.9*  30.1*  MCV 89.3 89.9 90.2 90.1 90.7  PLT 178 247 268 291 324   Cardiac Enzymes: Recent Labs  Lab 12/22/17 2108  TROPONINI 0.07*   BNP:     Signed:  Meredeth Ide MD.  [facility] 12/29/2017, 5:23 PM

## 2017-12-31 NOTE — Discharge Summary (Signed)
Physician Discharge Summary  Rhiannon Sassaman ZOX:[id number] DOB: Apr 25, 1933 DOA: 12/18/2017  PCP: Macy Mis, MD  Admit date: 12/18/2017 Discharge date: 12/31/2017  Time spent: *45 minutes  Recommendations for Outpatient Follow-up:  1. Follow up PCP in 2 weeks   Discharge Diagnoses:  Principal Problem:   Viral gastroenteritis Active Problems:   AKI (acute kidney injury) (HCC)   Aortic atherosclerosis (HCC)   CAD (coronary artery disease)   HTN (hypertension)   Hypothyroidism   UTI (urinary tract infection)   Generalized abdominal pain   Nausea and vomiting   Generalized abdominal tenderness   Chest pain   Discharge Condition: Stable  Diet recommendation: heart healthy diet  Filed Weights   12/24/17 0356 12/24/17 2337 12/28/17 0122  Weight: 63.1 kg (139 lb 1.8 oz) 59.9 kg (132 lb 0.9 oz) 64.4 kg (141 lb 14.4 oz)    History of present illness:  84 y.o.femalewitha history of CAD s/p CABG x3 2008, partial colectomy for invasive polyp and take back for postop infection remotely, HTN, hypothyroidism, HLD, and COPD who presented to the ED after she awoke with leg cramping and noted significant nausea, vomiting and generalized abdominal pain since last evening. This has happened sporadically for a few weeks, but was more severe than previously and associated with loose stools and abdominal bloating. She was hypertensive and afebrile in the ED with continued vomiting, AKI, leukocytosis and ketonuria. CT showed evidence of small bowel dysmotility, but no obstruction. It also noted diffuse aortic atherosclerosis and severe infrarenal luminal narrowing. Admission was requested for dehydration, AKI due to intractable nausea and vomiting due to presumed viral gastroenteritis.  12/24/2017-multiple episodes of diarrhea.kub-Unchanged small bowel dilatation compatible with high-grade partial or early small obstruction.   2/18 pt taking some clears Gen surgery signing off  2/19 Pt taken  to [facility] for  Cardiac stress testing. Was transitioned back to [facility] after stress testing     Hospital Course:  1. Gastroenteritis-CT abdomen showed dysmotility, no obstruction. Surgery was consulted and signed off. Patient tolerating diet well. 2. Acute kidney injury- elevation of serum creatinine likely from dehydration. BUN/creatinine has improved.  3. Aortic atherosclerosis with infrarenal luminal narrowing-vascular surgery was consulted, Dr Randie Heinz  feels that patient ABI showing severe PAD. He will follow patient in the clinic as outpatient. 4. CAD status post CABG times three-patient was seen by cardiology underwent nuclear stress testing which was low risk. Patient denies any chest pain. Cardiology has signed off. Repeat echo was done today to check aortic valve. Result is currently pending. 5. Hypertension- continue amlodipine, ACE inhibitor ARB to be avoided as per cardiology. Continue clonidine,Bidill, Cardizem. Also added low dose Amlodipine as per cardiology recommendation. 6. Hypothyroidism-stable, continue Synthroid     Procedures:  None   Consultations:  Cardiology  Vascular surgery  Discharge Exam: Vitals:   12/29/17 0512 12/29/17 1340  BP: (!) 160/49 (!) 150/55  Pulse: 76 75  Resp: (!) 23 20  Temp: 99 F (37.2 C) 98.7 F (37.1 C)  SpO2: 96% 94%    General: Appears in no acute distress Cardiovascular: S1S2 RRR Respiratory: Clear bilaterally  Discharge Instructions   Discharge Instructions    Diet - low sodium heart healthy   Complete by:  As directed    Diet - low sodium heart healthy   Complete by:  As directed    Increase activity slowly   Complete by:  As directed    Increase activity slowly   Complete by:  As directed  Allergies as of 12/29/2017      Reactions   Clams [shellfish Allergy] Anaphylaxis   Hydralazine Other (See Comments)   Possible cause of chest discomfort   Red Dye Other (See Comments)   Constipation       Medication List    STOP taking these medications   irbesartan-hydrochlorothiazide 150-12.5 MG tablet Commonly known as:  AVALIDE   spironolactone 25 MG tablet Commonly known as:  ALDACTONE     TAKE these medications   ALIVE WOMENS 50+ Tabs Take 1 tablet by mouth daily.   amLODipine 5 MG tablet Commonly known as:  NORVASC Take 1 tablet (5 mg total) by mouth daily.   aspirin 81 MG chewable tablet Chew 81 mg by mouth daily.   cloNIDine 0.1 MG tablet Commonly known as:  CATAPRES Take 1 tablet (0.1 mg total) by mouth 2 (two) times daily.   diltiazem 240 MG 24 hr capsule Commonly known as:  CARDIZEM CD Take 240 mg by mouth daily.   isosorbide-hydrALAZINE 20-37.5 MG tablet Commonly known as:  BIDIL Take 1 tablet by mouth 3 (three) times daily.   levothyroxine 50 MCG tablet Commonly known as:  SYNTHROID, LEVOTHROID Take 50 mcg by mouth daily.   montelukast 5 MG chewable tablet Commonly known as:  SINGULAIR Chew 5 mg by mouth at bedtime.   pravastatin 40 MG tablet Commonly known as:  PRAVACHOL Take 40 mg by mouth daily.   vitamin B-12 100 MCG tablet Commonly known as:  CYANOCOBALAMIN Take 1,000 mcg by mouth daily.   Vitamin D-3 1000 units Caps Take 1,000 Units by mouth daily.      Allergies  Allergen Reactions  . Clams [Shellfish Allergy] Anaphylaxis  . Hydralazine Other (See Comments)    Possible cause of chest discomfort  . Red Dye Other (See Comments)    Constipation    Follow-up Information    Macy Mis, MD Follow up in 2 week(s).   Specialty:  Family Medicine Contact information: [address] [place] [address] [phone]        Maeola Harman, MD. Schedule an appointment as soon as possible for a visit in 2 week(s).   Specialties:  Vascular Surgery, Cardiology Contact information: [address] [place] [address] [phone]            The results of significant diagnostics from this  hospitalization (including imaging, microbiology, ancillary and laboratory) are listed below for reference.    Significant Diagnostic Studies: Ct Abdomen Pelvis Wo Contrast  Result Date: 12/18/2017 CLINICAL DATA:  Acute onset of vomiting and generalized abdominal pain. EXAM: CT ABDOMEN AND PELVIS WITHOUT CONTRAST TECHNIQUE: Multidetector CT imaging of the abdomen and pelvis was performed following the standard protocol without IV contrast. COMPARISON:  None. FINDINGS: Lower chest: Minimal left basilar atelectasis or scarring is noted. Diffuse coronary artery calcifications are seen. The patient is status post median sternotomy. Hepatobiliary: Scattered nonspecific hypodensities within the liver measure up to 2.5 cm in size. The liver is otherwise unremarkable. The gallbladder is grossly unremarkable. The common bile duct remains normal in size. Pancreas: The pancreas is within normal limits. Spleen: Calcification at the spleen likely reflects remote traumatic injury. Adrenals/Urinary Tract: The adrenal glands are unremarkable in appearance. Vascular calcifications are seen at the renal hila bilaterally, and along both renal arteries. Nonspecific perinephric stranding is noted bilaterally. There is no evidence of hydronephrosis. A left renal cyst is noted. No obstructing ureteral stones are seen. Stomach/Bowel: There is mild  distention and gradual decompression of small bowel loops, which may reflect mild dysmotility. The ileocolic anastomosis at the left mid abdomen is unremarkable in appearance. The remaining colon is largely decompressed and grossly unremarkable. The stomach is partially filled with fluid and is unremarkable in appearance. Vascular/Lymphatic: Diffuse calcification is seen along the abdominal aorta and its branches, with severe luminal narrowing noted at the infrarenal abdominal aorta. No retroperitoneal or pelvic sidewall lymphadenopathy is seen. Reproductive: The bladder is mildly distended  and grossly unremarkable. The uterus is unremarkable. The ovaries are relatively symmetric. No suspicious adnexal masses are seen. Other: No additional soft tissue abnormalities are seen. Musculoskeletal: No acute osseous abnormalities are identified. Multilevel vacuum phenomenon is noted along the lumbar spine, with endplate sclerotic change and facet disease. The visualized musculature is unremarkable in appearance. IMPRESSION: 1. Mild distention and gradual decompression of small bowel loops, which may reflect mild small bowel dysmotility. No evidence of bowel obstruction. Ileocolic anastomosis is unremarkable in appearance 2. Diffuse aortic atherosclerosis, with severe luminal narrowing at the infrarenal abdominal aorta. 3. Diffuse coronary artery calcifications. 4. Scattered nonspecific hypodensities within the liver measure up to 2.5 cm in size. 5. Small left renal cyst. 6. Degenerative change along the lumbar spine. Aortic Atherosclerosis (ICD10-I70.0). Electronically Signed   By: Roanna Raider M.D.   On: 12/18/2017 04:13   Dg Abd 1 View  Result Date: 12/25/2017 CLINICAL DATA:  Assessment for small bowel obstruction. 24 hour delayed film. EXAM: ABDOMEN - 1 VIEW COMPARISON:  Radiograph yesterday at 0133 hour FINDINGS: Administered enteric contrast has dissipated and is seen primarily throughout small bowel. Patient has had ileal colonic anastomosis in the transverse colon, and enteric contrast does appear to reach the splenic flexure. Persistent gaseous small bowel distention in the central abdomen. IMPRESSION: Probable administered enteric contrast in the splenic flexure of the colon. Additional enteric contrast has dissipated and is seen in throughout small bowel. Electronically Signed   By: Rubye Oaks M.D.   On: 12/25/2017 01:59   Dg Abd 1 View  Result Date: 12/23/2017 CLINICAL DATA:  Abdominal pain and nausea. Recent bowel obstruction. EXAM: ABDOMEN - 1 VIEW COMPARISON:  December 22, 2017  FINDINGS: Nasogastric tube tip and side port in stomach. There are multiple loops of dilated small bowel. There is also a moderate air in the stomach. There is modest air in the colon. No appreciable air-fluid levels. No free air. There are iliac artery calcifications bilaterally. Lung bases are clear. IMPRESSION: Findings most consistent with a degree of persistent bowel obstruction, similar to 1 day prior. No free air. Nasogastric tube tip and side port in stomach. There is iliac artery atherosclerotic calcification bilaterally. Electronically Signed   By: Bretta Bang III M.D.   On: 12/23/2017 12:27   Dg Abd 1 View  Result Date: 12/22/2017 CLINICAL DATA:  Follow-up small bowel obstruction.  Abdominal pain. EXAM: ABDOMEN - 1 VIEW COMPARISON:  12/21/2017 FINDINGS: Multiple mildly dilated small bowel loops show no significant change. There is a relative paucity of colonic gas. These findings are consistent with mid to distal small bowel obstruction, and without significant change. IMPRESSION: Mid to distal small bowel obstruction, without significant change. Electronically Signed   By: Myles Rosenthal M.D.   On: 12/22/2017 07:21   Nm Myocar Multi W/planar Izetta Dakin Motion / Ef  Result Date: 12/27/2017 CLINICAL DATA:  Myocardial infarction. Prior CABG. Hypertension. Congestive heart failure and coronary artery disease. EXAM: MYOCARDIAL IMAGING WITH SPECT (REST AND EXERCISE) GATED LEFT VENTRICULAR WALL  MOTION STUDY LEFT VENTRICULAR EJECTION FRACTION TECHNIQUE: Standard myocardial SPECT imaging was performed after resting intravenous injection of 10 mCi Tc-32m tetrofosmin. Subsequently, exercise tolerance test was performed by the patient under the supervision of the Cardiology staff. At peak-stress, 30 mCi Tc-51m tetrofosmin was injected intravenously and standard myocardial SPECT imaging was performed. Quantitative gated imaging was also performed to evaluate left ventricular wall motion, and estimate left  ventricular ejection fraction. COMPARISON:  None FINDINGS: Perfusion: In the anterior left ventricular wall there is a matched defect on stress and rest images extending from the apex to the mid heart, overall moderate to large in size. Matched reduced activity in the lateral wall at the cardiac base is relatively small. No inducible ischemia. Wall Motion: Mild septal hypokinesis Left Ventricular Ejection Fraction: 58 % End diastolic volume 88 ml End systolic volume 36 ml IMPRESSION: 1. Moderate to large size matched defect compatible with scar in the anterior wall extending from the cardiac apex to the mid heart. Small matched defect in the lateral wall at the cardiac base. No inducible ischemia. 2. Mild septal hypokinesis. 3. Left ventricular ejection fraction 58% 4. Non invasive risk stratification*: Low *2012 Appropriate Use Criteria for Coronary Revascularization Focused Update: J Am Coll Cardiol. 2012;59(9):857-881. http://content.dementiazones.com.aspx?articleid=[email or web address] Electronically Signed   By: Gaylyn Rong M.D.   On: 12/27/2017 12:35   Dg Abd 2 Views  Result Date: 12/21/2017 CLINICAL DATA:  Abdominal pain with nausea, vomiting, and diarrhea. Previous right hemicolectomy. EXAM: ABDOMEN - 2 VIEW COMPARISON:  CT scans dated 12/18/2017 and 12/20/2017 FINDINGS: There are several distended small bowel loops with air-fluid levels on the upright radiograph including within the nondistended left side of the colon. No free air. Extensive vascular calcifications.  No acute bone abnormality. IMPRESSION: The number of distended small bowel loops has diminished since the prior CT scan. Finding is consistent with improving partial small bowel obstruction. Electronically Signed   By: Francene Boyers M.D.   On: 12/21/2017 10:15   Dg Abd Portable 1v-small Bowel Obstruction Protocol-initial, 8 Hr Delay  Result Date: 12/24/2017 CLINICAL DATA:  Small-bowel obstruction, 8 hour delayed image. EXAM: PORTABLE  ABDOMEN - 1 VIEW COMPARISON:  12/23/2017 at 1159 hours FINDINGS: Enteric contrast is seen within the stomach with gastric tube in place. Persistent unchanged dilatation of small bowel loops compatible small bowel obstruction is identified. Flocculent contrast within the stomach is noted without antegrade progression. IMPRESSION: Unchanged small bowel dilatation compatible with high-grade partial or early small obstruction. Contrast remains within the stomach at 8 hours from the start of imaging. Electronically Signed   By: Tollie Eth M.D.   On: 12/24/2017 01:59   Ct Angio Abd/pel W/ And/or W/o  Result Date: 12/20/2017 CLINICAL DATA:  Generalized abdominal pain for 3 weeks. Evaluate for ischemic bowel. EXAM: CTA ABDOMEN AND PELVIS wITHOUT AND WITH CONTRAST TECHNIQUE: Multidetector CT imaging of the abdomen and pelvis was performed using the standard protocol during bolus administration of intravenous contrast. Multiplanar reconstructed images and MIPs were obtained and reviewed to evaluate the vascular anatomy. CONTRAST:  80mL ISOVUE-370 IOPAMIDOL (ISOVUE-370) INJECTION 76% COMPARISON:  12/18/2017 FINDINGS: VASCULAR Aorta: Diffuse atherosclerotic calcifications and plaque in the abdominal aorta without aneurysmal dilatation. Celiac: There is significant narrowing at the origin with atherosclerotic calcification. Accessory left hepatic artery anatomy. SMA: Diffuse atherosclerotic soft and calcified plaque. No definite significant narrowing. Renals: Single left renal artery is patent with extensive atherosclerotic calcified and soft plaque in both renal arteries. There is extensive atherosclerotic calcification at the origin  of the right renal artery. Significant narrowing cannot be excluded. IMA: The vessel is severely diminutive. There is significant disease at the origin. Inflow: There are diffuse atherosclerotic calcifications throughout the bilateral common and external iliac arteries.There is severe disease  involving the proximal internal iliac arteries bilaterally. Proximal Outflow: Right common femoral artery is patent. The proximal superficial femoral artery on the right is severely diseased. The left common femoral artery is patent. The left superficial femoral artery is occluded. Veins: No obvious DVT. Review of the MIP images confirms the above findings. NON-VASCULAR Lower chest: There is dependent atelectasis at the right lung base. There is atelectasis versus airspace disease at the left lung base which has increased since the prior study. Hepatobiliary: Several liver cysts are stable. The left lobe of the liver is somewhat lobulated of unknown significance. Gallbladder is within normal limits. Pancreas: Unremarkable Spleen: There is a small calcified lesion at the upper anterior spleen of unknown significance. Adrenals/Urinary Tract: No evidence of renal mass or hydronephrosis. There is a cyst in the mid left kidney. Adrenal glands are unremarkable. There is a noticeable diminished opacification of the right renal parenchyma on arterial phase suggesting arterial insufficiency and significant narrowing in the right renal artery. Bladder is decompressed. Stomach/Bowel: The stomach is decompressed. There are several small bowel loops which are moderately dilated with air-fluid levels. Distal small bowel loops in the mid abdomen adjacent to the anastomosis to transverse colon are decompressed. The colon is decompressed. The patient is status post right hemicolectomy. No obvious mass in the colon. Lymphatic: No abnormal retroperitoneal adenopathy. Reproductive: Uterus and adnexa are within normal limits. Other: There is a small amount of free fluid layering in the pelvis, in the left paracolic gutter, and about the liver. Musculoskeletal: No vertebral compression deformity. Advanced degenerative disc disease in the lumbar spine. IMPRESSION: VASCULAR There is extensive atherosclerotic calcification and plaque in the  aorta without aneurysmal dilatation. There may be significant narrowing at the origin of the celiac axis and IMA, but there is no significant narrowing involving the SMA. There is plaque involving the SMA which is scattered. There is no evidence of acute thrombosis or vessel occlusion in the mesenteric vasculature. Chronic mesenteric ischemia is a concern and is associated with weight loss and chronic postprandial pain. Significant narrowing at the origin of the right renal artery. NON-VASCULAR There are moderately dilated small bowel loops as well as decompressed distal small bowel loops suggesting an early or partial small bowel obstruction pattern. The patient is status post right hemicolectomy. Colon is decompressed. There is free fluid in the abdomen suggesting an inflammatory process. Increasing bibasilar atelectasis. More confluent pulmonary density at the left base may represent developing consolidation. Electronically Signed   By: Jolaine ClickArthur  Hoss M.D.   On: 12/20/2017 15:35    Microbiology: Recent Results (from the past 240 hour(s))  C difficile quick scan w PCR reflex     Status: None   Collection Time: 12/21/17  5:34 PM  Result Value Ref Range Status   C Diff antigen NEGATIVE NEGATIVE Final   C Diff toxin NEGATIVE NEGATIVE Final   C Diff interpretation No C. difficile detected.  Final    Comment: Performed at [facility], [address] [place], [place] [id number]  MRSA PCR Screening     Status: None   Collection Time: 12/23/17  4:19 PM  Result Value Ref Range Status   MRSA by PCR NEGATIVE NEGATIVE Final    Comment:  The GeneXpert MRSA Assay (FDA approved for NASAL specimens only), is one component of a comprehensive MRSA colonization surveillance program. It is not intended to diagnose MRSA infection nor to guide or monitor treatment for MRSA infections. Performed at [facility], [address] [place], [place] [address]       Labs: Basic Metabolic Panel: Recent Labs  Lab 12/25/17 0330 12/28/17 1733 12/29/17 1538  NA 139 139 139  K 3.3* 2.8* 3.4*  CL 109 108 108  CO2 21* 22 22  GLUCOSE 111* 98 102*  BUN 11 6 6   CREATININE 1.45* 1.17* 1.03*  CALCIUM 9.2 9.1 8.7*   Liver Function Tests: No results for input(s): AST, ALT, ALKPHOS, BILITOT, PROT, ALBUMIN in the last 168 hours. No results for input(s): LIPASE, AMYLASE in the last 168 hours. No results for input(s): AMMONIA in the last 168 hours. CBC: Recent Labs  Lab 12/25/17 0330 12/26/17 0136 12/27/17 0418 12/28/17 0611 12/29/17 0555  WBC 7.1 10.4 10.2 9.4 8.5  HGB 10.7* 10.5* 9.4* 10.2* 10.2*  HCT 31.0* 31.0* 27.5* 29.9* 30.1*  MCV 89.3 89.9 90.2 90.1 90.7  PLT 178 247 268 291 324   Cardiac Enzymes: No results for input(s): CKTOTAL, CKMB, CKMBINDEX, TROPONINI in the last 168 hours. BNP:     Signed:  Meredeth Ide MD.  [facility] 12/31/2017, 7:45 AM

## 2018-01-10 DIAGNOSIS — I739 Peripheral vascular disease, unspecified: Secondary | ICD-10-CM | POA: Insufficient documentation

## 2018-01-27 ENCOUNTER — Other Ambulatory Visit: Payer: Self-pay

## 2018-01-27 ENCOUNTER — Emergency Department (HOSPITAL_COMMUNITY): Payer: Medicare HMO

## 2018-01-27 ENCOUNTER — Emergency Department (HOSPITAL_COMMUNITY)
Admission: EM | Admit: 2018-01-27 | Discharge: 2018-01-27 | Disposition: A | Discharge status: Home / Self Care | Payer: Medicare HMO | Attending: Emergency Medicine | Admitting: Emergency Medicine

## 2018-01-27 DIAGNOSIS — I509 Heart failure, unspecified: Secondary | ICD-10-CM | POA: Diagnosis not present

## 2018-01-27 DIAGNOSIS — I25810 Atherosclerosis of coronary artery bypass graft(s) without angina pectoris: Secondary | ICD-10-CM | POA: Insufficient documentation

## 2018-01-27 DIAGNOSIS — Z7982 Long term (current) use of aspirin: Secondary | ICD-10-CM | POA: Diagnosis not present

## 2018-01-27 DIAGNOSIS — Z87891 Personal history of nicotine dependence: Secondary | ICD-10-CM | POA: Insufficient documentation

## 2018-01-27 DIAGNOSIS — Z79899 Other long term (current) drug therapy: Secondary | ICD-10-CM | POA: Insufficient documentation

## 2018-01-27 DIAGNOSIS — K5900 Constipation, unspecified: Secondary | ICD-10-CM | POA: Diagnosis present

## 2018-01-27 DIAGNOSIS — K5641 Fecal impaction: Secondary | ICD-10-CM | POA: Diagnosis not present

## 2018-01-27 DIAGNOSIS — I110 Hypertensive heart disease with heart failure: Secondary | ICD-10-CM | POA: Insufficient documentation

## 2018-01-27 DIAGNOSIS — E039 Hypothyroidism, unspecified: Secondary | ICD-10-CM | POA: Insufficient documentation

## 2018-01-27 MED ORDER — LIDOCAINE HCL 2 % EX GEL
1.0000 "application " | CUTANEOUS | Status: DC | PRN
  Administered 2018-01-27: 1 via TOPICAL
  Filled 2018-01-27: qty 5

## 2018-01-27 NOTE — ED Notes (Signed)
Bed: [id number] Expected date:  Expected time:  Means of arrival:  Comments: [age] y abdominal pain

## 2018-01-27 NOTE — ED Triage Notes (Signed)
States she felt she had to have a bowel movement and applied a lot of pressure to try to force bowel out of her system. She noticed a little blood when she wiped, but had no bowel so she tried again. After pushing down again to force bowel out she noticed she was unsuccessful, but did noticed she had passed some clots instead. She says after getting up to take a brake, she tried to do this several times through out the night.  Patient states she was constipated possibly from pain medicine so she decided to take 2 stool softeners last night. Though she has passed clots several time, she has yet to have a bowel movement. She reports her last bowel movement was 3 days ago.

## 2018-01-27 NOTE — ED Provider Notes (Signed)
[facility]ERGENCY DEPT Provider Note: Katie [name] Blayde Bacigalupi, MD, FACEP  CSN: [id number] MRN: [id number] ARRIVAL: 01/27/18 at 0044 ROOM: WA18/WA18   CHIEF COMPLAINT  Constipation   HISTORY OF PRESENT ILLNESS  01/27/18 1:42 AM Katie [name] is a 84 y.o. female who states she has been on pain medicine which causes constipation.  She had been treating this with laxatives.  She is here with out having had a bowel movement for the past 3 days.  She feels pressure in her rectum but not frank pain.  When forcibly trying to have a bowel movement she has noticed rectal bleeding including the passage of clots.  She denies abdominal pain, nausea, vomiting, chest pain or shortness of breath.  She has not had relief with stool softeners.   Past Medical History:  Diagnosis Date  . CHF (congestive heart failure) (HCC)   . Coronary artery disease   . Hypertension   . Hypothyroidism     Past Surgical History:  Procedure Laterality Date  . cadiac stents    . CARDIAC CATHETERIZATION    . COLON SURGERY    . CORONARY ARTERY BYPASS GRAFT  2009    Family History  Problem Relation Age of Onset  . Hypertension Brother     Social History   Tobacco Use  . Smoking status: Former Games developermoker  . Smokeless tobacco: Never Used  . Tobacco comment: quit smoking over 30 yrs ago   Substance Use Topics  . Alcohol use: No    Frequency: Never  . Drug use: No    Prior to Admission medications   Medication Sig Start Date End Date Taking? Authorizing Provider  amLODipine (NORVASC) 5 MG tablet Take 1 tablet (5 mg total) by mouth daily. 12/29/17  Yes Meredeth IdeLama, Gagan S, MD  aspirin 81 MG chewable tablet Chew 81 mg by mouth daily.   Yes [provider]  Cholecalciferol (VITAMIN D-3) 1000 units CAPS Take 1,000 Units by mouth daily.   Yes [provider]  cloNIDine (CATAPRES) 0.1 MG tablet Take 1 tablet (0.1 mg total) by mouth 2 (two) times daily. 12/29/17  Yes Meredeth IdeLama, Gagan S, MD  diltiazem (CARDIZEM CD) 240 MG  24 hr capsule Take 240 mg by mouth daily. 09/25/17  Yes [provider]  hydrALAZINE (APRESOLINE) 25 MG tablet Take 25 mg by mouth 3 (three) times daily.   Yes [provider]  isosorbide dinitrate (ISORDIL) 20 MG tablet Take 20 mg by mouth 3 (three) times daily.   Yes [provider]  levothyroxine (SYNTHROID, LEVOTHROID) 50 MCG tablet Take 50 mcg by mouth daily. 12/03/17  Yes [provider]  montelukast (SINGULAIR) 5 MG chewable tablet Chew 5 mg by mouth at bedtime. 10/21/17  Yes [provider]  pravastatin (PRAVACHOL) 40 MG tablet Take 40 mg by mouth daily. 11/18/17  Yes [provider]  vitamin B-12 (CYANOCOBALAMIN) 1000 MCG tablet Take 1,000 mcg by mouth daily.   Yes [provider]  isosorbide-hydrALAZINE (BIDIL) 20-37.5 MG tablet Take 1 tablet by mouth 3 (three) times daily. Patient not taking: Reported on 01/27/2018 12/29/17   Meredeth IdeLama, Gagan S, MD    Allergies Clams [shellfish allergy]; Hydralazine; and Red dye   REVIEW OF SYSTEMS  Negative except as noted here or in the History of Present Illness.   PHYSICAL EXAMINATION  Initial Vital Signs Blood pressure (!) 194/72, pulse 94, temperature 97.9 F (36.6 C), temperature source Oral, resp. rate 16, height 4\' 9"  (1.448 m), weight 61.2 kg (135 lb),  SpO2 100 %.  Examination General: Well-developed, well-nourished female in no acute distress; appearance consistent with age of record HENT: normocephalic; atraumatic Eyes: pupils equal, round and reactive to light; extraocular muscles intact; bilateral arcus senilis and pseudophakia Neck: supple Heart: regular rate and rhythm Lungs: clear to auscultation bilaterally Abdomen: soft; distended; mild diffuse tenderness; bowel sounds present Rectal: Inflamed external hemorrhoids; fecal impaction with brown stool and no gross blood on examining glove Extremities: No deformity; full range of motion; pulses normal Neurologic: Awake,  alert and oriented; motor function intact in all extremities and symmetric; no facial droop Skin: Warm and dry Psychiatric: Normal mood and affect   RESULTS  Summary of this visit's results, reviewed by myself:   EKG Interpretation  Date/Time:    Ventricular Rate:    PR Interval:    QRS Duration:   QT Interval:    QTC Calculation:   R Axis:     Text Interpretation:        Laboratory Studies: No results found for this or any previous visit (from the past 24 hour(s)). Imaging Studies: Dg Abdomen 1 View  Result Date: 01/27/2018 CLINICAL DATA:  Constipation. EXAM: ABDOMEN - 1 VIEW COMPARISON:  Radiograph 12/25/2017 FINDINGS: Stool distends the rectum with rectal dimension of 7.1 cm. Small to moderate stool throughout the remainder the colon. No small bowel dilatation to suggest obstruction. There are vascular calcifications. No evidence of free air. IMPRESSION: Stool distended rectum to 7.1 cm. Small to moderate stool throughout the remainder the colon. No bowel obstruction. Electronically Signed   By: Rubye Oaks M.D.   On: 01/27/2018 02:48    ED COURSE  Nursing notes and initial vitals signs, including pulse oximetry, reviewed.  Vitals:   01/27/18 0048 01/27/18 0116  BP: (!) 194/72   Pulse: 94   Resp: 16   Temp: 97.9 F (36.6 C)   TempSrc: Oral   SpO2: 100%   Weight:  61.2 kg (135 lb)  Height:  4\' 9"  (1.448 m)   3:44 AM Patient had large bowel movement after soap suds enema.  She states she is ready to go home at this time.  Exam and radiographs consistent with rectal fecal impaction.  PROCEDURES    ED DIAGNOSES     ICD-10-CM   1. Fecal impaction in rectum (HCC) K56.41        Shariya Gaster, Jonny Ruiz, MD 01/27/18 0345

## 2018-01-30 ENCOUNTER — Other Ambulatory Visit: Payer: Self-pay

## 2018-01-30 ENCOUNTER — Emergency Department (HOSPITAL_COMMUNITY): Payer: Medicare HMO

## 2018-01-30 ENCOUNTER — Encounter (HOSPITAL_COMMUNITY): Payer: Self-pay

## 2018-01-30 ENCOUNTER — Observation Stay (HOSPITAL_COMMUNITY)
Admission: EM | Admit: 2018-01-30 | Discharge: 2018-02-01 | Disposition: A | Discharge status: Home Health | Payer: Medicare HMO | Attending: Internal Medicine | Admitting: Internal Medicine

## 2018-01-30 DIAGNOSIS — Z951 Presence of aortocoronary bypass graft: Secondary | ICD-10-CM | POA: Insufficient documentation

## 2018-01-30 DIAGNOSIS — Z87891 Personal history of nicotine dependence: Secondary | ICD-10-CM | POA: Insufficient documentation

## 2018-01-30 DIAGNOSIS — I509 Heart failure, unspecified: Secondary | ICD-10-CM | POA: Insufficient documentation

## 2018-01-30 DIAGNOSIS — Z7982 Long term (current) use of aspirin: Secondary | ICD-10-CM | POA: Insufficient documentation

## 2018-01-30 DIAGNOSIS — J101 Influenza due to other identified influenza virus with other respiratory manifestations: Principal | ICD-10-CM | POA: Diagnosis present

## 2018-01-30 DIAGNOSIS — Z79890 Hormone replacement therapy (postmenopausal): Secondary | ICD-10-CM | POA: Insufficient documentation

## 2018-01-30 DIAGNOSIS — R748 Abnormal levels of other serum enzymes: Secondary | ICD-10-CM | POA: Insufficient documentation

## 2018-01-30 DIAGNOSIS — E039 Hypothyroidism, unspecified: Secondary | ICD-10-CM | POA: Diagnosis present

## 2018-01-30 DIAGNOSIS — Z79899 Other long term (current) drug therapy: Secondary | ICD-10-CM | POA: Insufficient documentation

## 2018-01-30 DIAGNOSIS — A419 Sepsis, unspecified organism: Secondary | ICD-10-CM | POA: Diagnosis present

## 2018-01-30 DIAGNOSIS — R1310 Dysphagia, unspecified: Secondary | ICD-10-CM | POA: Insufficient documentation

## 2018-01-30 DIAGNOSIS — R10817 Generalized abdominal tenderness: Secondary | ICD-10-CM | POA: Diagnosis present

## 2018-01-30 DIAGNOSIS — I2510 Atherosclerotic heart disease of native coronary artery without angina pectoris: Secondary | ICD-10-CM | POA: Diagnosis present

## 2018-01-30 DIAGNOSIS — E860 Dehydration: Secondary | ICD-10-CM | POA: Insufficient documentation

## 2018-01-30 DIAGNOSIS — R10827 Generalized rebound abdominal tenderness: Secondary | ICD-10-CM | POA: Diagnosis not present

## 2018-01-30 DIAGNOSIS — J449 Chronic obstructive pulmonary disease, unspecified: Secondary | ICD-10-CM | POA: Diagnosis not present

## 2018-01-30 DIAGNOSIS — I110 Hypertensive heart disease with heart failure: Secondary | ICD-10-CM | POA: Diagnosis not present

## 2018-01-30 DIAGNOSIS — I10 Essential (primary) hypertension: Secondary | ICD-10-CM | POA: Diagnosis not present

## 2018-01-30 DIAGNOSIS — N179 Acute kidney failure, unspecified: Secondary | ICD-10-CM | POA: Diagnosis not present

## 2018-01-30 DIAGNOSIS — Z9049 Acquired absence of other specified parts of digestive tract: Secondary | ICD-10-CM | POA: Diagnosis not present

## 2018-01-30 DIAGNOSIS — E876 Hypokalemia: Secondary | ICD-10-CM | POA: Diagnosis present

## 2018-01-30 DIAGNOSIS — R109 Unspecified abdominal pain: Secondary | ICD-10-CM | POA: Insufficient documentation

## 2018-01-30 DIAGNOSIS — R6510 Systemic inflammatory response syndrome (SIRS) of non-infectious origin without acute organ dysfunction: Secondary | ICD-10-CM | POA: Diagnosis present

## 2018-01-30 DIAGNOSIS — J111 Influenza due to unidentified influenza virus with other respiratory manifestations: Secondary | ICD-10-CM | POA: Diagnosis present

## 2018-01-30 LAB — CBC WITH DIFFERENTIAL/PLATELET
Basophils Absolute: 0 10*3/uL (ref 0.0–0.1)
Basophils Relative: 0 %
EOS PCT: 0 %
Eosinophils Absolute: 0 10*3/uL (ref 0.0–0.7)
HCT: 38 % (ref 36.0–46.0)
Hemoglobin: 12.5 g/dL (ref 12.0–15.0)
LYMPHS ABS: 0.9 10*3/uL (ref 0.7–4.0)
Lymphocytes Relative: 9 %
MCH: 30.7 pg (ref 26.0–34.0)
MCHC: 32.9 g/dL (ref 30.0–36.0)
MCV: 93.4 fL (ref 78.0–100.0)
MONO ABS: 1.1 10*3/uL — AB (ref 0.1–1.0)
MONOS PCT: 11 %
Neutro Abs: 8.1 10*3/uL — ABNORMAL HIGH (ref 1.7–7.7)
Neutrophils Relative %: 80 %
Platelets: 239 10*3/uL (ref 150–400)
RBC: 4.07 MIL/uL (ref 3.87–5.11)
RDW: 14.6 % (ref 11.5–15.5)
WBC: 10.1 10*3/uL (ref 4.0–10.5)

## 2018-01-30 LAB — URINALYSIS, ROUTINE W REFLEX MICROSCOPIC
Bilirubin Urine: NEGATIVE
GLUCOSE, UA: NEGATIVE mg/dL
HGB URINE DIPSTICK: NEGATIVE
Ketones, ur: NEGATIVE mg/dL
Leukocytes, UA: NEGATIVE
Nitrite: NEGATIVE
PROTEIN: 30 mg/dL — AB
SPECIFIC GRAVITY, URINE: 1.009 (ref 1.005–1.030)
pH: 6 (ref 5.0–8.0)

## 2018-01-30 LAB — COMPREHENSIVE METABOLIC PANEL
ALBUMIN: 3.8 g/dL (ref 3.5–5.0)
ALK PHOS: 50 U/L (ref 38–126)
ALT: 17 U/L (ref 14–54)
ANION GAP: 11 (ref 5–15)
AST: 34 U/L (ref 15–41)
BILIRUBIN TOTAL: 0.8 mg/dL (ref 0.3–1.2)
BUN: 11 mg/dL (ref 6–20)
CALCIUM: 9.3 mg/dL (ref 8.9–10.3)
CO2: 26 mmol/L (ref 22–32)
CREATININE: 1.47 mg/dL — AB (ref 0.44–1.00)
Chloride: 100 mmol/L — ABNORMAL LOW (ref 101–111)
GFR calc Af Amer: 37 mL/min — ABNORMAL LOW (ref >60)
GFR calc non Af Amer: 32 mL/min — ABNORMAL LOW (ref >60)
Glucose, Bld: 107 mg/dL — ABNORMAL HIGH (ref 65–99)
Potassium: 3.2 mmol/L — ABNORMAL LOW (ref 3.5–5.1)
Sodium: 137 mmol/L (ref 135–145)
TOTAL PROTEIN: 7.8 g/dL (ref 6.5–8.1)

## 2018-01-30 LAB — LACTIC ACID, PLASMA: Lactic Acid, Venous: 2 mmol/L (ref 0.5–1.9)

## 2018-01-30 LAB — INFLUENZA PANEL BY PCR (TYPE A & B)
INFLAPCR: POSITIVE — AB
Influenza B By PCR: NEGATIVE

## 2018-01-30 LAB — LIPASE, BLOOD: Lipase: 58 U/L — ABNORMAL HIGH (ref 11–51)

## 2018-01-30 MED ORDER — POLYETHYLENE GLYCOL 3350 17 G PO PACK: 17.0000 g | PACK | Freq: Every day | ORAL | Status: DC | PRN

## 2018-01-30 MED ORDER — SODIUM CHLORIDE 0.9 % IV BOLUS
500.0000 mL | Freq: Once | INTRAVENOUS | Status: AC
  Administered 2018-01-30: 500 mL via INTRAVENOUS

## 2018-01-30 MED ORDER — LEVALBUTEROL HCL 0.63 MG/3ML IN NEBU: 0.6300 mg | INHALATION_SOLUTION | RESPIRATORY_TRACT | Status: DC | PRN

## 2018-01-30 MED ORDER — IOPAMIDOL (ISOVUE-300) INJECTION 61%
INTRAVENOUS | Status: AC
  Administered 2018-01-30: 30 mL via ORAL
  Filled 2018-01-30: qty 30

## 2018-01-30 MED ORDER — ONDANSETRON HCL 4 MG/2ML IJ SOLN: 4.0000 mg | Freq: Four times a day (QID) | INTRAMUSCULAR | Status: DC | PRN

## 2018-01-30 MED ORDER — POTASSIUM CHLORIDE 10 MEQ/100ML IV SOLN
10.0000 meq | INTRAVENOUS | Status: AC
  Administered 2018-01-30 (×2): 10 meq via INTRAVENOUS
  Filled 2018-01-30 (×2): qty 100

## 2018-01-30 MED ORDER — CHLORHEXIDINE GLUCONATE 0.12 % MT SOLN
15.0000 mL | Freq: Two times a day (BID) | OROMUCOSAL | Status: DC
  Administered 2018-01-30 – 2018-02-01 (×4): 15 mL via OROMUCOSAL
  Filled 2018-01-30 (×4): qty 15

## 2018-01-30 MED ORDER — AMLODIPINE BESYLATE 5 MG PO TABS
5.0000 mg | ORAL_TABLET | Freq: Every day | ORAL | Status: DC
  Administered 2018-01-30 – 2018-02-01 (×3): 5 mg via ORAL
  Filled 2018-01-30 (×3): qty 1

## 2018-01-30 MED ORDER — SODIUM CHLORIDE 0.9 % IV SOLN
INTRAVENOUS | Status: DC
  Administered 2018-01-30: 19:00:00 via INTRAVENOUS

## 2018-01-30 MED ORDER — MONTELUKAST SODIUM 5 MG PO CHEW
5.0000 mg | CHEWABLE_TABLET | Freq: Every day | ORAL | Status: DC
  Administered 2018-01-31: 5 mg via ORAL
  Filled 2018-01-30: qty 1

## 2018-01-30 MED ORDER — IPRATROPIUM-ALBUTEROL 0.5-2.5 (3) MG/3ML IN SOLN: 3.0000 mL | Freq: Four times a day (QID) | RESPIRATORY_TRACT | Status: DC

## 2018-01-30 MED ORDER — ENOXAPARIN SODIUM 30 MG/0.3ML ~~LOC~~ SOLN
30.0000 mg | SUBCUTANEOUS | Status: DC
  Administered 2018-01-30 – 2018-01-31 (×2): 30 mg via SUBCUTANEOUS
  Filled 2018-01-30 (×2): qty 0.3

## 2018-01-30 MED ORDER — ACETAMINOPHEN 650 MG RE SUPP: 650.0000 mg | Freq: Four times a day (QID) | RECTAL | Status: DC | PRN

## 2018-01-30 MED ORDER — LEVOTHYROXINE SODIUM 50 MCG PO TABS
50.0000 ug | ORAL_TABLET | Freq: Every day | ORAL | Status: DC
  Administered 2018-01-31 – 2018-02-01 (×2): 50 ug via ORAL
  Filled 2018-01-30 (×2): qty 1

## 2018-01-30 MED ORDER — ALUM & MAG HYDROXIDE-SIMETH 200-200-20 MG/5ML PO SUSP
30.0000 mL | Freq: Once | ORAL | Status: AC
  Administered 2018-01-30: 30 mL via ORAL
  Filled 2018-01-30: qty 30

## 2018-01-30 MED ORDER — ACETAMINOPHEN 325 MG PO TABS
650.0000 mg | ORAL_TABLET | Freq: Once | ORAL | Status: AC | PRN
  Administered 2018-01-30: 650 mg via ORAL
  Filled 2018-01-30: qty 2

## 2018-01-30 MED ORDER — SODIUM CHLORIDE 0.9 % IV SOLN
2.0000 g | Freq: Once | INTRAVENOUS | Status: AC
  Administered 2018-01-30: 2 g via INTRAVENOUS
  Filled 2018-01-30: qty 2

## 2018-01-30 MED ORDER — POTASSIUM CHLORIDE CRYS ER 20 MEQ PO TBCR
40.0000 meq | EXTENDED_RELEASE_TABLET | Freq: Once | ORAL | Status: AC
  Administered 2018-01-30: 40 meq via ORAL
  Filled 2018-01-30: qty 2

## 2018-01-30 MED ORDER — PRAVASTATIN SODIUM 40 MG PO TABS
40.0000 mg | ORAL_TABLET | Freq: Every day | ORAL | Status: DC
  Administered 2018-01-31 – 2018-02-01 (×2): 40 mg via ORAL
  Filled 2018-01-30 (×2): qty 1

## 2018-01-30 MED ORDER — ONDANSETRON HCL 4 MG/2ML IJ SOLN
4.0000 mg | Freq: Once | INTRAMUSCULAR | Status: AC
  Administered 2018-01-30: 4 mg via INTRAVENOUS
  Filled 2018-01-30: qty 2

## 2018-01-30 MED ORDER — GUAIFENESIN ER 600 MG PO TB12
600.0000 mg | ORAL_TABLET | Freq: Two times a day (BID) | ORAL | Status: DC
  Administered 2018-01-30 – 2018-02-01 (×4): 600 mg via ORAL
  Filled 2018-01-30 (×4): qty 1

## 2018-01-30 MED ORDER — SODIUM CHLORIDE 0.9 % IV SOLN
INTRAVENOUS | Status: AC
  Administered 2018-01-30: 23:00:00 via INTRAVENOUS

## 2018-01-30 MED ORDER — ACETAMINOPHEN 325 MG PO TABS
650.0000 mg | ORAL_TABLET | Freq: Four times a day (QID) | ORAL | Status: DC | PRN
  Administered 2018-01-31: 650 mg via ORAL
  Filled 2018-01-30: qty 2

## 2018-01-30 MED ORDER — ORAL CARE MOUTH RINSE
15.0000 mL | Freq: Two times a day (BID) | OROMUCOSAL | Status: DC
  Administered 2018-01-31: 15 mL via OROMUCOSAL

## 2018-01-30 MED ORDER — ENSURE ENLIVE PO LIQD
237.0000 mL | Freq: Two times a day (BID) | ORAL | Status: DC
  Administered 2018-01-31 – 2018-02-01 (×3): 237 mL via ORAL

## 2018-01-30 MED ORDER — SODIUM CHLORIDE 0.9 % IV SOLN
500.0000 mg | INTRAVENOUS | Status: DC
  Administered 2018-01-31: 500 mg via INTRAVENOUS
  Filled 2018-01-30: qty 500

## 2018-01-30 MED ORDER — ASPIRIN 81 MG PO CHEW
81.0000 mg | CHEWABLE_TABLET | Freq: Every day | ORAL | Status: DC
  Administered 2018-01-31 – 2018-02-01 (×2): 81 mg via ORAL
  Filled 2018-01-30 (×2): qty 1

## 2018-01-30 MED ORDER — IOPAMIDOL (ISOVUE-300) INJECTION 61%
30.0000 mL | Freq: Once | INTRAVENOUS | Status: AC | PRN
  Administered 2018-01-30: 30 mL via ORAL

## 2018-01-30 MED ORDER — OSELTAMIVIR PHOSPHATE 75 MG PO CAPS
75.0000 mg | ORAL_CAPSULE | Freq: Two times a day (BID) | ORAL | Status: DC
  Administered 2018-01-30 – 2018-01-31 (×2): 75 mg via ORAL
  Filled 2018-01-30 (×2): qty 1

## 2018-01-30 MED ORDER — ISOSORB DINITRATE-HYDRALAZINE 20-37.5 MG PO TABS
1.0000 | ORAL_TABLET | Freq: Three times a day (TID) | ORAL | Status: DC
  Administered 2018-01-30 – 2018-01-31 (×3): 1 via ORAL
  Filled 2018-01-30 (×7): qty 1

## 2018-01-30 MED ORDER — ONDANSETRON HCL 4 MG PO TABS: 4.0000 mg | ORAL_TABLET | Freq: Four times a day (QID) | ORAL | Status: DC | PRN

## 2018-01-30 MED ORDER — VANCOMYCIN HCL IN DEXTROSE 1-5 GM/200ML-% IV SOLN
1000.0000 mg | Freq: Once | INTRAVENOUS | Status: AC
  Administered 2018-01-30: 1000 mg via INTRAVENOUS
  Filled 2018-01-30: qty 200

## 2018-01-30 MED ORDER — CLONIDINE HCL 0.1 MG PO TABS
0.1000 mg | ORAL_TABLET | Freq: Two times a day (BID) | ORAL | Status: DC
  Administered 2018-01-30 – 2018-02-01 (×4): 0.1 mg via ORAL
  Filled 2018-01-30 (×4): qty 1

## 2018-01-30 MED ORDER — BISACODYL 10 MG RE SUPP: 10.0000 mg | Freq: Every day | RECTAL | Status: DC | PRN

## 2018-01-30 MED ORDER — LEVALBUTEROL HCL 0.63 MG/3ML IN NEBU: 0.6300 mg | INHALATION_SOLUTION | Freq: Four times a day (QID) | RESPIRATORY_TRACT | Status: DC | PRN

## 2018-01-30 MED ORDER — DILTIAZEM HCL ER COATED BEADS 240 MG PO CP24
240.0000 mg | ORAL_CAPSULE | Freq: Every day | ORAL | Status: DC
  Administered 2018-01-31 – 2018-02-01 (×2): 240 mg via ORAL
  Filled 2018-01-30 (×2): qty 1

## 2018-01-30 NOTE — H&P (Addendum)
Katie [name]:[id number] DOB: [birth year] DOA: 01/30/2018     PCP: Macy Mis, MD  [facility]:   CARDS:  Dr. Jae Dire   Patient arrived to ER on 01/30/18 at [phone]  Patient coming from: home Lives With family    Chief Complaint:  Chief Complaint  Patient presents with  . Cough    HPI: Katie [name] is a 84 y.o. female with medical history significant of  CAD, partial colectomy for invasive polyp and take back for postop infection remotely, HTN, hypothyroidism, HLD, and COPD   Presented with cough and abnominal pain Endorses subjective fevers and chills, No NV but reports disphasia.  Abdominal pain worse with cough. Reports epigastric discomfort worse with cough. Husband have been coughing too. She has had occasional wheezing.  Denies chest pain.  February patient was admitted with significant abdominal pain and AKI  CT at that time showed evidence of small bowel dysmotility but no obstruction she was admitted for dehydration and felt to be secondary to viral gastroenteritis Seen by surgery and recovered with conservative management   While in ER: Was noted to be febrile Respiratory panel ordered Influenza PCR and blood cultures ordered Significant initial  Findings: Abnormal Labs Reviewed  COMPREHENSIVE METABOLIC PANEL - Abnormal; Notable for the following components:      Result Value   Potassium 3.2 (*)    Chloride 100 (*)    Glucose, Bld 107 (*)    Creatinine, Ser 1.47 (*)    GFR calc non Af Amer 32 (*)    GFR calc Af Amer 37 (*)    All other components within normal limits  LIPASE, BLOOD - Abnormal; Notable for the following components:   Lipase 58 (*)    All other components within normal limits  CBC WITH DIFFERENTIAL/PLATELET - Abnormal; Notable for the following components:   Neutro Abs 8.1 (*)    Monocytes Absolute 1.1 (*)    All other components within normal limits  URINALYSIS, ROUTINE W REFLEX MICROSCOPIC - Abnormal;  Notable for the following components:   Protein, ur 30 (*)    Bacteria, UA RARE (*)    Squamous Epithelial / LPF 6-30 (*)    All other components within normal limits      Na 137 K 3.2  Cr  Up from baseline see below of 1.03 Lab Results  Component Value Date   CREATININE 1.47 (H) 01/30/2018   CREATININE 1.03 (H) 12/29/2017   CREATININE 1.17 (H) 12/28/2017    Lipase 58  WBC  10.1  HG/HCT Up from baseline see below 10.2    Component Value Date/Time   HGB 12.5 01/30/2018 1133   HCT 38.0 01/30/2018 1133     Lactic Acid, Venous    Component Value Date/Time   LATICACIDVEN 1.0 12/20/2017 1134      UA no evidence of UTI      CXR -   NON acute CT abdomen nonacute  ECG:  Not obtained     ED Triage Vitals  Enc Vitals Group     BP 01/30/18 0959 (!) 177/62     Pulse Rate 01/30/18 0959 (!) 109     Resp 01/30/18 0959 20     Temp 01/30/18 0957 (!) 100.5 F (38.1 C)     Temp Source 01/30/18 0957 Oral     SpO2 01/30/18 0959 96 %     Weight 01/30/18 0953 135 lb (61.2 kg)     Height 01/30/18 0953  4\' 9"  (1.448 m)     Head Circumference --      Peak Flow --      Pain Score 01/30/18 0953 10     Pain Loc --      Pain Edu? --      Excl. in GC? --   TMAX(24)@     on arrival  ED Triage Vitals  Enc Vitals Group     BP 01/30/18 0959 (!) 177/62     Pulse Rate 01/30/18 0959 (!) 109     Resp 01/30/18 0959 20     Temp 01/30/18 0957 (!) 100.5 F (38.1 C)     Temp Source 01/30/18 0957 Oral     SpO2 01/30/18 0959 96 %     Weight 01/30/18 0953 135 lb (61.2 kg)     Height 01/30/18 0953 4\' 9"  (1.448 m)     Head Circumference --      Peak Flow --      Pain Score 01/30/18 0953 10     Pain Loc --      Pain Edu? --      Excl. in GC? --      Latest  Blood pressure (!) 175/72, pulse 92, temperature (!) 100.5 F (38.1 C), temperature source Oral, resp. rate (!) 23, height 4\' 9"  (1.448 m), weight 61.2 kg (135 lb), SpO2 95 %.   Following Medications were ordered in  ER: Medications  sodium chloride 0.9 % bolus 500 mL (500 mLs Intravenous Given 01/30/18 1132)    And  0.9 %  sodium chloride infusion ( Intravenous New Bag/Given 01/30/18 1900)  acetaminophen (TYLENOL) tablet 650 mg (650 mg Oral Given 01/30/18 1000)  ondansetron (ZOFRAN) injection 4 mg (4 mg Intravenous Given 01/30/18 1859)  iopamidol (ISOVUE-300) 61 % injection 30 mL (30 mLs Oral Contrast Given 01/30/18 1558)  potassium chloride SA (K-DUR,KLOR-CON) CR tablet 40 mEq (40 mEq Oral Given 01/30/18 1900)  alum & mag hydroxide-simeth (MAALOX/MYLANTA) 200-200-20 MG/5ML suspension 30 mL (30 mLs Oral Given 01/30/18 1859)     Hospitalist was called for admission for SIRS likely viral  Regarding pertinent Chronic problems: History of coronary artery disease status post CABG x3 in 2008  Review of Systems:    Pertinent positives include: Fevers, chills, fatigue, shortness of breath at rest. excess mucus, wheezing.  Constitutional:  No weight loss, night sweats,  weight loss  HEENT:  No headaches, Difficulty swallowing,Tooth/dental problems,Sore throat,  No sneezing, itching, ear ache, nasal congestion, post nasal drip,  Cardio-vascular:  No chest pain, Orthopnea, PND, anasarca, dizziness, palpitations.no Bilateral lower extremity swelling  GI:  No heartburn, indigestion, abdominal pain, nausea, vomiting, diarrhea, change in bowel habits, loss of appetite, melena, blood in stool, hematemesis Resp:   no  dyspnea on exertion, No  no productive cough, No non-productive cough, No coughing up of blood.No change in color of mucus.No Skin:  no rash or lesions. No jaundice GU:  no dysuria, change in color of urine, no urgency or frequency. No straining to urinate.  No flank pain.  Musculoskeletal:  No joint pain or no joint swelling. No decreased range of motion. No back pain.  Psych:  No change in mood or affect. No depression or anxiety. No memory loss.  Neuro: no localizing neurological complaints, no  tingling, no weakness, no double vision, no gait abnormality, no slurred speech, no confusion  As per HPI otherwise 10 point review of systems negative.   Past Medical History: Past Medical History:  Diagnosis  Date  . CHF (congestive heart failure) (HCC)   . Coronary artery disease   . Hypertension   . Hypothyroidism        Past Surgical History:  Procedure Laterality Date  . cadiac stents    . CARDIAC CATHETERIZATION    . COLON SURGERY    . CORONARY ARTERY BYPASS GRAFT  2009    Social History:  Ambulatory   Independently      reports that she has quit smoking. She has never used smokeless tobacco. She reports that she does not drink alcohol or use drugs.     Family History:   Family History  Problem Relation Age of Onset  . Hypertension Brother     Allergies: Allergies  Allergen Reactions  . Clams [Shellfish Allergy] Anaphylaxis  . Hydralazine Other (See Comments)    Possible cause of chest discomfort  . Red Dye Other (See Comments)    Constipation   . Sesame Seed [Sesame Oil] Swelling and Other (See Comments)    Wheezing      Prior to Admission medications   Medication Sig Start Date End Date Taking? Authorizing Provider  amLODipine (NORVASC) 5 MG tablet Take 1 tablet (5 mg total) by mouth daily. 12/29/17  Yes Meredeth Ide, MD  aspirin 81 MG chewable tablet Chew 81 mg by mouth daily.   Yes [provider]  Cholecalciferol (VITAMIN D-3) 1000 units CAPS Take 1,000 Units by mouth daily.   Yes [provider]  cloNIDine (CATAPRES) 0.1 MG tablet Take 1 tablet (0.1 mg total) by mouth 2 (two) times daily. 12/29/17  Yes Meredeth Ide, MD  diltiazem (CARDIZEM CD) 240 MG 24 hr capsule Take 240 mg by mouth daily. 09/25/17  Yes [provider]  ENSURE PLUS (ENSURE PLUS) LIQD Take 237 mLs by mouth daily.   Yes [provider]  isosorbide-hydrALAZINE (BIDIL) 20-37.5 MG tablet Take 1 tablet by mouth 3 (three) times daily. 12/29/17  Yes  Meredeth Ide, MD  levothyroxine (SYNTHROID, LEVOTHROID) 50 MCG tablet Take 50 mcg by mouth daily. 12/03/17  Yes [provider]  montelukast (SINGULAIR) 5 MG chewable tablet Chew 5 mg by mouth at bedtime. 10/21/17  Yes [provider]  Multiple Vitamin (MULTIVITAMIN WITH MINERALS) TABS tablet Take 1 tablet by mouth daily.   Yes [provider]  pravastatin (PRAVACHOL) 40 MG tablet Take 40 mg by mouth daily. 11/18/17  Yes [provider]  vitamin B-12 (CYANOCOBALAMIN) 1000 MCG tablet Take 1,000 mcg by mouth daily.   Yes [provider]   Physical Exam: Blood pressure (!) 175/72, pulse 92, temperature (!) 100.5 F (38.1 C), temperature source Oral, resp. rate (!) 23, height 4\' 9"  (1.448 m), weight 61.2 kg (135 lb), SpO2 95 %. 1. General:  in No Acute distress  acutely ill -appearing 2. Psychological: Alert and  Oriented 3. Head/ENT:    Dry Mucous Membranes                          Head Non traumatic, neck supple                           Poor Dentition 4. SKIN:   decreased Skin turgor,  Skin clean Dry and intact no rash 5. Heart: Regular rate and rhythm no Murmur, no Rub or gallop 6. Lungs:  no wheezes mild occasional crackles   7. Abdomen: Soft,  non-tender, Non  distended   Obese bowel sounds present 8. Lower extremities: no clubbing, cyanosis, or edema 9. Neurologically Grossly intact, moving all 4 extremities equally  10. MSK: Normal range of motion   LABS:     Recent Labs  Lab 01/30/18 1133  WBC 10.1  NEUTROABS 8.1*  HGB 12.5  HCT 38.0  MCV 93.4  PLT 239   Basic Metabolic Panel: Recent Labs  Lab 01/30/18 1133  NA 137  K 3.2*  CL 100*  CO2 26  GLUCOSE 107*  BUN 11  CREATININE 1.47*  CALCIUM 9.3     Recent Labs  Lab 01/30/18 1133  AST 34  ALT 17  ALKPHOS 50  BILITOT 0.8  PROT 7.8  ALBUMIN 3.8   Recent Labs  Lab 01/30/18 1133  LIPASE 58*     Urine analysis:    Component Value Date/Time   COLORURINE  YELLOW 01/30/2018 1313   APPEARANCEUR CLEAR 01/30/2018 1313   LABSPEC 1.009 01/30/2018 1313   PHURINE 6.0 01/30/2018 1313   GLUCOSEU NEGATIVE 01/30/2018 1313   HGBUR NEGATIVE 01/30/2018 1313   BILIRUBINUR NEGATIVE 01/30/2018 1313   KETONESUR NEGATIVE 01/30/2018 1313   PROTEINUR 30 (A) 01/30/2018 1313   NITRITE NEGATIVE 01/30/2018 1313   LEUKOCYTESUR NEGATIVE 01/30/2018 1313      Cultures: No results found for: SDES, SPECREQUEST, CULT, REPTSTATUS   Radiological Exams on Admission: Ct Abdomen Pelvis Wo Contrast  Result Date: 01/30/2018 CLINICAL DATA:  84 y/o  F; coughing, sore throat, abdominal pain. EXAM: CT ABDOMEN AND PELVIS WITHOUT CONTRAST TECHNIQUE: Multidetector CT imaging of the abdomen and pelvis was performed following the standard protocol without IV contrast. COMPARISON:  12/20/2017 CT abdomen and pelvis FINDINGS: Lower chest: Cardiomegaly and coronary artery calcification. Hepatobiliary: Stable liver cysts. No other focal liver lesion. No gallbladder wall thickening or gallstone. No biliary ductal dilatation. Pancreas: Unremarkable. No pancreatic ductal dilatation or surrounding inflammatory changes. Spleen: Stable focus of splenic calcification. Otherwise no focal splenic lesion. Adrenals/Urinary Tract: Adrenal glands are unremarkable. Multiple renal hilar calcifications, likely vascular. Kidneys are normal, without renal calculi, focal lesion, or hydronephrosis. Bladder is unremarkable. Stomach/Bowel: Stomach is within normal limits. Scattered sigmoid diverticulosis. No evidence of bowel wall thickening, distention, or inflammatory changes. Vascular/Lymphatic: Aortic and iliofemoral atherosclerosis with severe calcification. No enlarged abdominal or pelvic lymph nodes. Reproductive: Uterus and bilateral adnexa are unremarkable. Other: No abdominal wall hernia or abnormality. No abdominopelvic ascites. Musculoskeletal: No fracture is seen. Moderate degenerative changes of the lumbar  spine with discogenic degenerative disease greatest at L2-L4 and extensive lower lumbar facet arthrosis. IMPRESSION: 1. No acute process identified. 2. Coronary and aortic calcific atherosclerosis. Stable cardiomegaly. 3. Stable liver cysts. 4. Scattered sigmoid diverticulosis, no findings of diverticulitis. 5. Moderate degenerative changes of lumbar spine. Electronically Signed   By: Mitzi Hansen M.D.   On: 01/30/2018 18:24   Dg Abdomen Acute W/chest  Result Date: 01/30/2018 CLINICAL DATA:  Abdominal pain, cough EXAM: DG ABDOMEN ACUTE W/ 1V CHEST COMPARISON:  01/27/2018 FINDINGS: Cardiac enlargement. Median sternotomy. Atherosclerotic calcification of the ascending aorta and aortic arch and descending aorta. Negative for heart failure. No edema or effusion. Mild left lower lobe atelectasis with elevated left hemidiaphragm. Nonobstructive bowel gas pattern. No air-fluid levels in the bowel. No free air. Atherosclerotic disease. Lumbar disc degeneration and spurring L3-4 and L4-5. No acute skeletal abnormality. Degenerative change in the pubic symphysis. IMPRESSION: Mild left lower lobe atelectasis. No acute cardiopulmonary abnormality Normal bowel gas pattern.  No acute abnormality in the abdomen.  Aortic Atherosclerosis (ICD10-I70.0). Electronically Signed   By: Marlan Palau M.D.   On: 01/30/2018 12:04    Chart has been reviewed    Assessment/Plan   84 y.o. female with medical history significant of  CAD, partial colectomy for invasive polyp and take back for postop infection remotely, HTN, hypothyroidism, HLD, and COPD  Admitted for SIRS secondary to type a influenza  Present on Admission: . SIRS (systemic inflammatory response syndrome) (HCC) - secondary to influenza type A we will rehydrate start Tamiflu patient initiated originally on antibiotics await results of blood cultures and de-escalate as able. Continue rehydration.  . Hypokalemia - will replace, check magnesium level     . HTN (hypertension) restart home medications given accelerated hypertension . Hypothyroidism -restart Synthroid check TSH . Generalized abdominal tenderness  -likely related to viral illness CT of abdomen unremarkable . CAD (coronary artery disease) -  - chronic, continue aspirin and statin    . AKI (acute kidney injury) (HCC) - - likely secondary to dehydration, check FeNA and if not improved with IVF    Dysphasia patient states she feels like food is getting stuck in her throat.  Order speech pathology dysphagia evaluation.  Be secondary to general globulus sensation in the setting of acute illness  Elevated lipase slightly no evidence of pancreatitis on CT of abdomen   History of COPD currently no evidence of wheezing continue as needed nebulizers as needed scheduled DuoNeb Other plan as per orders.  DVT prophylaxis:    Lovenox     Code Status:  FULL CODE  as per patient   Family Communication:   Family  at  Bedside  plan of care was discussed with Husband,    Disposition Plan:    To home once workup is complete and patient is stable                                               Consults called: none  Admission status:    obs   Level of care    tele          I have spent a total of 56 min on this admission   Lurae Hornbrook 01/30/2018, 10:56 PM    [facility]  Pager [phone]   after 2 AM please page floor coverage PA If 7AM-7PM, please contact the day team taking care of the patient  [email or web address]  Password [email or web address]

## 2018-01-30 NOTE — ED Notes (Signed)
ED TO INPATIENT HANDOFF REPORT  Name/Age/Gender Katie [name] 84 y.o. female  Code Status Code Status History    Date Active Date Inactive Code Status Order ID Comments User Context   12/18/2017 1009 12/29/2017 2229 Full Code [id number]  Patrecia Pour, MD Inpatient      Home/SNF/Other Home  Chief Complaint abd pain  Level of Care/Admitting Diagnosis ED Disposition    ED Disposition Condition [facility] Area: [facility] [[id number]]  Level of Care: Telemetry [5]  Admit to tele based on following criteria: Other see comments  Comments: hypokalemia  Diagnosis: SIRS (systemic inflammatory response syndrome) ([place]) [947096]  Admitting Physician: Toy Baker [[id number]]  Attending Physician: Toy Baker [[id number]]  PT Class (Do Not Modify): Observation [104]  PT Acc Code (Do Not Modify): Observation [10022]       Medical History Past Medical History:  Diagnosis Date  . CHF (congestive heart failure) ([place])   . Coronary artery disease   . Hypertension   . Hypothyroidism     Allergies Allergies  Allergen Reactions  . Clams [Shellfish Allergy] Anaphylaxis  . Hydralazine Other (See Comments)    Possible cause of chest discomfort  . Red Dye Other (See Comments)    Constipation   . Sesame Seed [Sesame Oil] Swelling and Other (See Comments)    Wheezing     IV Location/Drains/Wounds Patient Lines/Drains/Airways Status   Active Line/Drains/Airways    Name:   Placement date:   Placement time:   Site:   Days:   Peripheral IV 01/30/18 Left Wrist   01/30/18    1133    Wrist   less than 1          Labs/Imaging Results for orders placed or performed during the hospital encounter of 01/30/18 (from the past 48 hour(s))  Comprehensive metabolic panel     Status: Abnormal   Collection Time: 01/30/18 11:33 AM  Result Value Ref Range   Sodium 137 135 - 145 mmol/L   Potassium 3.2 (L) 3.5 - 5.1 mmol/L   Chloride 100 (L) 101 - 111 mmol/L    CO2 26 22 - 32 mmol/L   Glucose, Bld 107 (H) 65 - 99 mg/dL   BUN 11 6 - 20 mg/dL   Creatinine, Ser 1.47 (H) 0.44 - 1.00 mg/dL   Calcium 9.3 8.9 - 10.3 mg/dL   Total Protein 7.8 6.5 - 8.1 g/dL   Albumin 3.8 3.5 - 5.0 g/dL   AST 34 15 - 41 U/L   ALT 17 14 - 54 U/L   Alkaline Phosphatase 50 38 - 126 U/L   Total Bilirubin 0.8 0.3 - 1.2 mg/dL   GFR calc non Af Amer 32 (L) >60 mL/min   GFR calc Af Amer 37 (L) >60 mL/min    Comment: (NOTE) The eGFR has been calculated using the CKD EPI equation. This calculation has not been validated in all clinical situations. eGFR's persistently <60 mL/min signify possible Chronic Kidney Disease.    Anion gap 11 5 - 15    Comment: Performed at [facility], [place] [address] [place], [place] [address]  Lipase, blood     Status: Abnormal   Collection Time: 01/30/18 11:33 AM  Result Value Ref Range   Lipase 58 (H) 11 - 51 U/L    Comment: Performed at [facility], [place] [address], [place], [place] [address]  CBC WITH DIFFERENTIAL     Status: Abnormal   Collection  Time: 01/30/18 11:33 AM  Result Value Ref Range   WBC 10.1 4.0 - 10.5 K/uL   RBC 4.07 3.87 - 5.11 MIL/uL   Hemoglobin 12.5 12.0 - 15.0 g/dL   HCT 38.0 36.0 - 46.0 %   MCV 93.4 78.0 - 100.0 fL   MCH 30.7 26.0 - 34.0 pg   MCHC 32.9 30.0 - 36.0 g/dL   RDW 14.6 11.5 - 15.5 %   Platelets 239 150 - 400 K/uL   Neutrophils Relative % 80 %   Neutro Abs 8.1 (H) 1.7 - 7.7 K/uL   Lymphocytes Relative 9 %   Lymphs Abs 0.9 0.7 - 4.0 K/uL   Monocytes Relative 11 %   Monocytes Absolute 1.1 (H) 0.1 - 1.0 K/uL   Eosinophils Relative 0 %   Eosinophils Absolute 0.0 0.0 - 0.7 K/uL   Basophils Relative 0 %   Basophils Absolute 0.0 0.0 - 0.1 K/uL    Comment: Performed at [facility], [place] [address] [place], [place] [address]  Urinalysis, Routine w reflex microscopic     Status: Abnormal   Collection Time: 01/30/18  1:13 PM  Result Value Ref  Range   Color, Urine YELLOW YELLOW   APPearance CLEAR CLEAR   Specific Gravity, Urine 1.009 1.005 - 1.030   pH 6.0 5.0 - 8.0   Glucose, UA NEGATIVE NEGATIVE mg/dL   Hgb urine dipstick NEGATIVE NEGATIVE   Bilirubin Urine NEGATIVE NEGATIVE   Ketones, ur NEGATIVE NEGATIVE mg/dL   Protein, ur 30 (A) NEGATIVE mg/dL   Nitrite NEGATIVE NEGATIVE   Leukocytes, UA NEGATIVE NEGATIVE   RBC / HPF 0-5 0 - 5 RBC/hpf   WBC, UA 0-5 0 - 5 WBC/hpf   Bacteria, UA RARE (A) NONE SEEN   Squamous Epithelial / LPF 6-30 (A) NONE SEEN   Mucus PRESENT     Comment: Performed at [facility], [place] [address] [place], [place] [address]  Influenza panel by PCR (type A & B)     Status: Abnormal   Collection Time: 01/30/18  7:28 PM  Result Value Ref Range   Influenza A By PCR POSITIVE (A) NEGATIVE   Influenza B By PCR NEGATIVE NEGATIVE    Comment: (NOTE) The Xpert Xpress Flu assay is intended as an aid in the diagnosis of  influenza and should not be used as a sole basis for treatment.  This  assay is FDA approved for nasopharyngeal swab specimens only. Nasal  washings and aspirates are unacceptable for Xpert Xpress Flu testing. Performed at [facility], [place] [address], [place], [place] [address]    Ct Abdomen Pelvis Wo Contrast  Result Date: 01/30/2018 CLINICAL DATA:  84 y/o  F; coughing, sore throat, abdominal pain. EXAM: CT ABDOMEN AND PELVIS WITHOUT CONTRAST TECHNIQUE: Multidetector CT imaging of the abdomen and pelvis was performed following the standard protocol without IV contrast. COMPARISON:  12/20/2017 CT abdomen and pelvis FINDINGS: Lower chest: Cardiomegaly and coronary artery calcification. Hepatobiliary: Stable liver cysts. No other focal liver lesion. No gallbladder wall thickening or gallstone. No biliary ductal dilatation. Pancreas: Unremarkable. No pancreatic ductal dilatation or surrounding inflammatory changes. Spleen: Stable focus of splenic calcification.  Otherwise no focal splenic lesion. Adrenals/Urinary Tract: Adrenal glands are unremarkable. Multiple renal hilar calcifications, likely vascular. Kidneys are normal, without renal calculi, focal lesion, or hydronephrosis. Bladder is unremarkable. Stomach/Bowel: Stomach is within normal limits. Scattered sigmoid diverticulosis. No evidence of bowel wall thickening, distention, or inflammatory changes. Vascular/Lymphatic: Aortic and  iliofemoral atherosclerosis with severe calcification. No enlarged abdominal or pelvic lymph nodes. Reproductive: Uterus and bilateral adnexa are unremarkable. Other: No abdominal wall hernia or abnormality. No abdominopelvic ascites. Musculoskeletal: No fracture is seen. Moderate degenerative changes of the lumbar spine with discogenic degenerative disease greatest at L2-L4 and extensive lower lumbar facet arthrosis. IMPRESSION: 1. No acute process identified. 2. Coronary and aortic calcific atherosclerosis. Stable cardiomegaly. 3. Stable liver cysts. 4. Scattered sigmoid diverticulosis, no findings of diverticulitis. 5. Moderate degenerative changes of lumbar spine. Electronically Signed   By: Kristine Garbe M.D.   On: 01/30/2018 18:24   Dg Abdomen Acute W/chest  Result Date: 01/30/2018 CLINICAL DATA:  Abdominal pain, cough EXAM: DG ABDOMEN ACUTE W/ 1V CHEST COMPARISON:  01/27/2018 FINDINGS: Cardiac enlargement. Median sternotomy. Atherosclerotic calcification of the ascending aorta and aortic arch and descending aorta. Negative for heart failure. No edema or effusion. Mild left lower lobe atelectasis with elevated left hemidiaphragm. Nonobstructive bowel gas pattern. No air-fluid levels in the bowel. No free air. Atherosclerotic disease. Lumbar disc degeneration and spurring L3-4 and L4-5. No acute skeletal abnormality. Degenerative change in the pubic symphysis. IMPRESSION: Mild left lower lobe atelectasis. No acute cardiopulmonary abnormality Normal bowel gas pattern.   No acute abnormality in the abdomen. Aortic Atherosclerosis (ICD10-I70.0). Electronically Signed   By: Franchot Gallo M.D.   On: 01/30/2018 12:04    Pending Labs Unresulted Labs (From admission, onward)   Start     Ordered   01/30/18 2012  Hepatic function panel  Add-on,   R     01/30/18 2011   01/30/18 2011  Magnesium  Add-on,   R     01/30/18 2011   01/30/18 2011  Phosphorus  Add-on,   R     01/30/18 2011   01/30/18 1840  Respiratory Panel by PCR  (Respiratory virus panel)  Once,   R     01/30/18 1840   Signed and Held  Culture, blood (x 2)  BLOOD CULTURE X 2,   STAT    Comments:  INITIATE ANTIBIOTICS WITHIN 1 HOUR AFTER BLOOD CULTURES DRAWN.  If unable to obtain blood cultures, call MD immediately regarding antibiotic instructions.    Signed and Held   Signed and Held  Lactic acid, plasma  STAT Now then every 3 hours,   STAT     Signed and Held   Signed and Held  Procalcitonin  STAT,   R     Signed and Held   Signed and Held  Magnesium  Tomorrow morning,   R    Comments:  Call MD if <1.5    Signed and Held   Signed and Held  Phosphorus  Tomorrow morning,   R     Signed and Held   Signed and Held  TSH  Once,   R    Comments:  Cancel if already done within 1 month and notify MD    Signed and Held   Signed and Held  Comprehensive metabolic panel  Once,   R    Comments:  Cal MD for K<3.5 or >5.0    Signed and Held   Signed and Held  CBC  Once,   R    Comments:  Call for hg <8.0    Signed and Held      Vitals/Pain Today's Vitals   01/30/18 1145 01/30/18 1232 01/30/18 1521 01/30/18 1734  BP:  (!) 166/58 (!) 173/72 (!) 175/72  Pulse:  86 86 92  Resp:  19  13 (!) 23  Temp:      TempSrc:      SpO2:  97% 96% 95%  Weight:      Height:      PainSc: 0-No pain       Isolation Precautions Droplet precaution  Medications Medications  sodium chloride 0.9 % bolus 500 mL (500 mLs Intravenous Given 01/30/18 1132)    And  0.9 %  sodium chloride infusion ( Intravenous New  Bag/Given 01/30/18 1900)  potassium chloride 10 mEq in 100 mL IVPB (has no administration in time range)  acetaminophen (TYLENOL) tablet 650 mg (650 mg Oral Given 01/30/18 1000)  ondansetron (ZOFRAN) injection 4 mg (4 mg Intravenous Given 01/30/18 1859)  iopamidol (ISOVUE-300) 61 % injection 30 mL (30 mLs Oral Contrast Given 01/30/18 1558)  potassium chloride SA (K-DUR,KLOR-CON) CR tablet 40 mEq (40 mEq Oral Given 01/30/18 1900)  alum & mag hydroxide-simeth (MAALOX/MYLANTA) 200-200-20 MG/5ML suspension 30 mL (30 mLs Oral Given 01/30/18 1859)    Mobility walks

## 2018-01-30 NOTE — ED Notes (Signed)
Vera advised to wait 15 minutes before coming up with patient.

## 2018-01-30 NOTE — ED Notes (Addendum)
Ambulated to bathroom. O2 sats drop to 89 on RA

## 2018-01-30 NOTE — ED Provider Notes (Signed)
Assumed care at 4 PM. Briefly, pt is an 84 yo F here with fever, cough, abdominal pain. Suspect this is viral illness, but CT pending. Labs reassuring.  CT scan w/o focal abnormality. However, pt with persistent weakness, difficulty ambulating 2/2 weakness. Reviewed labs, concern for possible occult sepsis, SIRS with viral illness. Given age, fever, borderline leukocytosis, weakness, and tachycardia with dehydration, will admit for obs.    Shaune PollackIsaacs, Tonnie Friedel, MD 01/30/18 [phone]

## 2018-01-30 NOTE — ED Triage Notes (Signed)
Patient c/o non productive cough x 2 days.

## 2018-01-30 NOTE — ED Notes (Signed)
Pt ambulated to restroom without difficulty

## 2018-01-30 NOTE — Progress Notes (Signed)
Pt gave permission for her husband to remain in the room while nursing admission history was done. Briscoe Burns. Katie [name] BSN, RN-BC Admissions RN 01/30/2018 7:13 PM

## 2018-01-30 NOTE — ED Provider Notes (Signed)
[facility]-EMERGENCY DEPT Provider Note   CSN: [id number] Arrival date & time: 01/30/18  4098     History   Chief Complaint Chief Complaint  Patient presents with  . Cough    HPI Katie [name] is a 84 y.o. female.  HPI Pt started coughing on Friday night.   The sx save persisted and increased.  She has a dry cough.  She has been feeling feverish and chilled.  No measured fevers.   No vomiting or diarrhea.  No trouble with rhinitis.  Her throat is sore.  Her throat and chest hurts when she coughs. Her abdomen also hurts when she coughs.   Pt has a history of constipation.  She was seen in the ED on the 22nd of this month for that.   She was given an enema for fecal impaction. Past Medical History:  Diagnosis Date  . CHF (congestive heart failure) ([facility])   . Coronary artery disease   . Hypertension   . Hypothyroidism     Patient Active Problem List   Diagnosis Date Noted  . Chest pain   . Generalized abdominal pain   . Nausea and vomiting   . Generalized abdominal tenderness   . UTI (urinary tract infection) 12/19/2017  . Viral gastroenteritis 12/18/2017  . AKI (acute kidney injury) ([facility]) 12/18/2017  . Aortic atherosclerosis ([facility]) 12/18/2017  . CAD (coronary artery disease) 12/18/2017  . HTN (hypertension) 12/18/2017  . Hypothyroidism 12/18/2017    Past Surgical History:  Procedure Laterality Date  . cadiac stents    . CARDIAC CATHETERIZATION    . COLON SURGERY    . CORONARY ARTERY BYPASS GRAFT  2009     OB History   None      Home Medications    Prior to Admission medications   Medication Sig Start Date End Date Taking? Authorizing Provider  amLODipine (NORVASC) 5 MG tablet Take 1 tablet (5 mg total) by mouth daily. 12/29/17  Yes Meredeth Ide, MD  aspirin 81 MG chewable tablet Chew 81 mg by mouth daily.   Yes [provider]  Cholecalciferol (VITAMIN D-3) 1000 units CAPS Take 1,000 Units by mouth daily.   Yes [provider]  cloNIDine (CATAPRES) 0.1 MG tablet Take 1 tablet (0.1 mg total) by mouth 2 (two) times daily. 12/29/17  Yes Meredeth Ide, MD  diltiazem (CARDIZEM CD) 240 MG 24 hr capsule Take 240 mg by mouth daily. 09/25/17  Yes [provider]  ENSURE PLUS (ENSURE PLUS) LIQD Take 237 mLs by mouth daily.   Yes [provider]  isosorbide-hydrALAZINE (BIDIL) 20-37.5 MG tablet Take 1 tablet by mouth 3 (three) times daily. 12/29/17  Yes Meredeth Ide, MD  levothyroxine (SYNTHROID, LEVOTHROID) 50 MCG tablet Take 50 mcg by mouth daily. 12/03/17  Yes [provider]  montelukast (SINGULAIR) 5 MG chewable tablet Chew 5 mg by mouth at bedtime. 10/21/17  Yes [provider]  Multiple Vitamin (MULTIVITAMIN WITH MINERALS) TABS tablet Take 1 tablet by mouth daily.   Yes [provider]  pravastatin (PRAVACHOL) 40 MG tablet Take 40 mg by mouth daily. 11/18/17  Yes [provider]  vitamin B-12 (CYANOCOBALAMIN) 1000 MCG tablet Take 1,000 mcg by mouth daily.   Yes [provider]    Family History Family History  Problem Relation Age of Onset  . Hypertension Brother     Social History Social History   Tobacco Use  . Smoking status: Former Games developer  .  Smokeless tobacco: Never Used  . Tobacco comment: quit smoking over 30 yrs ago   Substance Use Topics  . Alcohol use: No    Frequency: Never  . Drug use: No     Allergies   Clams [shellfish allergy]; Hydralazine; Red dye; and Sesame seed [sesame oil]   Review of Systems Review of Systems  Respiratory: Positive for cough.   Gastrointestinal: Negative for constipation (had a bm this am), nausea and vomiting.  Genitourinary: Negative for dysuria.  All other systems reviewed and are negative.    Physical Exam Updated Vital Signs BP (!) 173/72   Pulse 86   Temp (!) 100.5 F (38.1 C) (Oral)   Resp 13   Ht 1.448 m (4\' 9" )   Wt 61.2 kg (135 lb)   SpO2 96%   BMI 29.21 kg/m    Physical Exam  Constitutional: She appears well-developed and well-nourished. No distress.  HENT:  Head: Normocephalic and atraumatic.  Right Ear: External ear normal.  Left Ear: External ear normal.  Eyes: Conjunctivae are normal. Right eye exhibits no discharge. Left eye exhibits no discharge. No scleral icterus.  Neck: Neck supple. No tracheal deviation present.  Cardiovascular: Normal rate, regular rhythm and intact distal pulses.  Pulmonary/Chest: Effort normal and breath sounds normal. No stridor. No respiratory distress. She has no wheezes. She has no rales.  Abdominal: Soft. Bowel sounds are normal. She exhibits no distension. There is generalized tenderness. There is no rebound and no guarding. No hernia.  Musculoskeletal: She exhibits no edema or tenderness.  Neurological: She is alert. She has normal strength. No cranial nerve deficit (no facial droop, extraocular movements intact, no slurred speech) or sensory deficit. She exhibits normal muscle tone. She displays no seizure activity. Coordination normal.  Skin: Skin is warm and dry. No rash noted.  Psychiatric: She has a normal mood and affect.  Nursing note and vitals reviewed.    ED Treatments / Results  Labs (all labs ordered are listed, but only abnormal results are displayed) Labs Reviewed  COMPREHENSIVE METABOLIC PANEL - Abnormal; Notable for the following components:      Result Value   Potassium 3.2 (*)    Chloride 100 (*)    Glucose, Bld 107 (*)    Creatinine, Ser 1.47 (*)    GFR calc non Af Amer 32 (*)    GFR calc Af Amer 37 (*)    All other components within normal limits  LIPASE, BLOOD - Abnormal; Notable for the following components:   Lipase 58 (*)    All other components within normal limits  CBC WITH DIFFERENTIAL/PLATELET - Abnormal; Notable for the following components:   Neutro Abs 8.1 (*)    Monocytes Absolute 1.1 (*)    All other components within normal limits  URINALYSIS, ROUTINE W REFLEX  MICROSCOPIC - Abnormal; Notable for the following components:   Protein, ur 30 (*)    Bacteria, UA RARE (*)    Squamous Epithelial / LPF 6-30 (*)    All other components within normal limits     Radiology Dg Abdomen Acute W/chest  Result Date: 01/30/2018 CLINICAL DATA:  Abdominal pain, cough EXAM: DG ABDOMEN ACUTE W/ 1V CHEST COMPARISON:  01/27/2018 FINDINGS: Cardiac enlargement. Median sternotomy. Atherosclerotic calcification of the ascending aorta and aortic arch and descending aorta. Negative for heart failure. No edema or effusion. Mild left lower lobe atelectasis with elevated left hemidiaphragm. Nonobstructive bowel gas pattern. No air-fluid levels in the bowel. No free air. Atherosclerotic disease.  Lumbar disc degeneration and spurring L3-4 and L4-5. No acute skeletal abnormality. Degenerative change in the pubic symphysis. IMPRESSION: Mild left lower lobe atelectasis. No acute cardiopulmonary abnormality Normal bowel gas pattern.  No acute abnormality in the abdomen. Aortic Atherosclerosis (ICD10-I70.0). Electronically Signed   By: Marlan Palau M.D.   On: 01/30/2018 12:04    Procedures Procedures (including critical care time)  Medications Ordered in ED Medications  sodium chloride 0.9 % bolus 500 mL (500 mLs Intravenous Given 01/30/18 1132)    And  0.9 %  sodium chloride infusion (has no administration in time range)  ondansetron (ZOFRAN) injection 4 mg (4 mg Intravenous Refused 01/30/18 1131)  acetaminophen (TYLENOL) tablet 650 mg (650 mg Oral Given 01/30/18 1000)  iopamidol (ISOVUE-300) 61 % injection 30 mL (30 mLs Oral Contrast Given 01/30/18 1558)     Initial Impression / Assessment and Plan / ED Course  I have reviewed the triage vital signs and the nursing notes.  Pertinent labs & imaging results that were available during my care of the patient were reviewed by me and considered in my medical decision making (see chart for details).  Clinical Course as of Jan 30 1633    Mon Jan 30, 2018  1525 Labs reviewed.  No acute abnormalities.  NO pna or bowel obstruction on xrays.  Still with abd ttp.  Concerned about possible diverticulitis with her abd pain and fever.   Will ct to evaluate further   [JK]    Clinical Course User Index [JK] Linwood Dibbles, MD    Pt still has abdominal pain with coughing.  Abd ttp on exam.  Sx may be related to uri abdominal wall discomfort however with her recent constipation complaints and fever today I think a CT scan is indicated to evaluate for diverticulitis.  Dr Erma Heritage will follow up on that result.  Final Clinical Impressions(s) / ED Diagnoses  pending   Linwood Dibbles, MD 01/30/18 [phone]

## 2018-01-31 DIAGNOSIS — I10 Essential (primary) hypertension: Secondary | ICD-10-CM | POA: Diagnosis not present

## 2018-01-31 DIAGNOSIS — J101 Influenza due to other identified influenza virus with other respiratory manifestations: Secondary | ICD-10-CM | POA: Diagnosis not present

## 2018-01-31 DIAGNOSIS — I2510 Atherosclerotic heart disease of native coronary artery without angina pectoris: Secondary | ICD-10-CM | POA: Diagnosis not present

## 2018-01-31 DIAGNOSIS — N179 Acute kidney failure, unspecified: Secondary | ICD-10-CM | POA: Diagnosis not present

## 2018-01-31 LAB — C DIFFICILE QUICK SCREEN W PCR REFLEX
C DIFFICLE (CDIFF) ANTIGEN: NEGATIVE
C Diff interpretation: NOT DETECTED
C Diff toxin: NEGATIVE

## 2018-01-31 LAB — RESPIRATORY PANEL BY PCR
ADENOVIRUS-RVPPCR: NOT DETECTED
Bordetella pertussis: NOT DETECTED
CHLAMYDOPHILA PNEUMONIAE-RVPPCR: NOT DETECTED
CORONAVIRUS NL63-RVPPCR: NOT DETECTED
Coronavirus 229E: NOT DETECTED
Coronavirus HKU1: NOT DETECTED
Coronavirus OC43: NOT DETECTED
INFLUENZA A H3-RVPPCR: DETECTED — AB
Influenza B: NOT DETECTED
MYCOPLASMA PNEUMONIAE-RVPPCR: NOT DETECTED
Metapneumovirus: NOT DETECTED
PARAINFLUENZA VIRUS 4-RVPPCR: NOT DETECTED
Parainfluenza Virus 1: NOT DETECTED
Parainfluenza Virus 2: NOT DETECTED
Parainfluenza Virus 3: NOT DETECTED
Respiratory Syncytial Virus: NOT DETECTED
Rhinovirus / Enterovirus: NOT DETECTED

## 2018-01-31 LAB — CBC
HCT: 34.7 % — ABNORMAL LOW (ref 36.0–46.0)
HEMOGLOBIN: 11.6 g/dL — AB (ref 12.0–15.0)
MCH: 30.8 pg (ref 26.0–34.0)
MCHC: 33.4 g/dL (ref 30.0–36.0)
MCV: 92 fL (ref 78.0–100.0)
PLATELETS: 221 10*3/uL (ref 150–400)
RBC: 3.77 MIL/uL — AB (ref 3.87–5.11)
RDW: 14.6 % (ref 11.5–15.5)
WBC: 7 10*3/uL (ref 4.0–10.5)

## 2018-01-31 LAB — COMPREHENSIVE METABOLIC PANEL
ALK PHOS: 41 U/L (ref 38–126)
ALT: 17 U/L (ref 14–54)
ANION GAP: 8 (ref 5–15)
AST: 38 U/L (ref 15–41)
Albumin: 3.2 g/dL — ABNORMAL LOW (ref 3.5–5.0)
BUN: 8 mg/dL (ref 6–20)
CALCIUM: 8.5 mg/dL — AB (ref 8.9–10.3)
CO2: 23 mmol/L (ref 22–32)
CREATININE: 1.17 mg/dL — AB (ref 0.44–1.00)
Chloride: 105 mmol/L (ref 101–111)
GFR calc non Af Amer: 42 mL/min — ABNORMAL LOW (ref >60)
GFR, EST AFRICAN AMERICAN: 49 mL/min — AB (ref >60)
GLUCOSE: 94 mg/dL (ref 65–99)
Potassium: 3.6 mmol/L (ref 3.5–5.1)
Sodium: 136 mmol/L (ref 135–145)
Total Bilirubin: 0.6 mg/dL (ref 0.3–1.2)
Total Protein: 7 g/dL (ref 6.5–8.1)

## 2018-01-31 LAB — HEPATIC FUNCTION PANEL
ALBUMIN: 3.7 g/dL (ref 3.5–5.0)
ALK PHOS: 48 U/L (ref 38–126)
ALT: 18 U/L (ref 14–54)
AST: 47 U/L — ABNORMAL HIGH (ref 15–41)
BILIRUBIN TOTAL: 0.9 mg/dL (ref 0.3–1.2)
Bilirubin, Direct: 0.1 mg/dL (ref 0.1–0.5)
Indirect Bilirubin: 0.8 mg/dL (ref 0.3–0.9)
Total Protein: 8.1 g/dL (ref 6.5–8.1)

## 2018-01-31 LAB — TSH: TSH: 2.642 u[IU]/mL (ref 0.350–4.500)

## 2018-01-31 LAB — PROCALCITONIN: PROCALCITONIN: 0.2 ng/mL

## 2018-01-31 LAB — PHOSPHORUS
Phosphorus: 2.5 mg/dL (ref 2.5–4.6)
Phosphorus: 2.5 mg/dL (ref 2.5–4.6)

## 2018-01-31 LAB — MRSA PCR SCREENING: MRSA BY PCR: NEGATIVE

## 2018-01-31 LAB — MAGNESIUM
MAGNESIUM: 1.8 mg/dL (ref 1.7–2.4)
Magnesium: 1.9 mg/dL (ref 1.7–2.4)

## 2018-01-31 LAB — LACTIC ACID, PLASMA: Lactic Acid, Venous: 1.5 mmol/L (ref 0.5–1.9)

## 2018-01-31 MED ORDER — HYDRALAZINE HCL 20 MG/ML IJ SOLN: 5.0000 mg | INTRAMUSCULAR | Status: DC | PRN

## 2018-01-31 MED ORDER — OSELTAMIVIR PHOSPHATE 30 MG PO CAPS
30.0000 mg | ORAL_CAPSULE | Freq: Every day | ORAL | Status: DC
  Administered 2018-02-01: 30 mg via ORAL
  Filled 2018-01-31: qty 1

## 2018-01-31 MED ORDER — CEFEPIME HCL 1 G IJ SOLR: 1.0000 g | INTRAMUSCULAR | Status: DC

## 2018-01-31 MED ORDER — POTASSIUM CHLORIDE IN NACL 20-0.9 MEQ/L-% IV SOLN
INTRAVENOUS | Status: DC
  Administered 2018-01-31 (×2): via INTRAVENOUS
  Filled 2018-01-31 (×3): qty 1000

## 2018-01-31 MED ORDER — VANCOMYCIN HCL 500 MG IV SOLR: 500.0000 mg | INTRAVENOUS | Status: DC

## 2018-01-31 NOTE — Progress Notes (Signed)
PHARMACY NOTE:  ANTIMICROBIAL RENAL DOSAGE ADJUSTMENT  Current antimicrobial regimen includes a mismatch between antimicrobial dosage and estimated renal function. As per policy approved by the Pharmacy & Therapeutics and Medical Executive Committees, the antimicrobial dosage will be adjusted accordingly.  Current antimicrobial and dosage:  Tamiflu 75 mg q12 hr  Indication: influenza  Renal Function:   Estimated Creatinine Clearance: 27.3 mL/min (A) (by C-G formula based on SCr of 1.17 mg/dL (H)). []      On intermittent HD, scheduled: []      On CRRT    Antimicrobial dosage has been changed to:  Tamiflu 30 mg daily   Additional Comments: none   Thank you for allowing pharmacy to be a part of this patient's care.  Bernadene Personrew Lyndon Chapel, PharmD, BCPS [phone] 01/31/2018, 2:41 PM

## 2018-01-31 NOTE — Progress Notes (Signed)
[facility] chosen for HHPT rep Clydie BraunKaren aware.

## 2018-01-31 NOTE — Evaluation (Signed)
Physical Therapy Evaluation Patient Details Name: Katie [name] MRN: [id number] DOB: 09-12-1933 Today's Date: 01/31/2018   History of Present Illness  84 yo female admitted with hypokalemia, SIRS, +flu. Hx of CAD, CBG, partial colectomy, COPD.   Clinical Impression  On eval, pt was Min guard assist for mobility. She walked ~60 feet. Pt was unsteady at times. She c/o R LE weakness. Pt presents with general weakness, decreased activity tolerance, and impaired gait and balance. Recommend HHPT f/u at discharge. Will follow during hospital stay.     Follow Up Recommendations Home health PT    Equipment Recommendations  None recommended by PT    Recommendations for Other Services       Precautions / Restrictions Precautions Precautions: Fall Precaution Comments: droplet Restrictions Weight Bearing Restrictions: No      Mobility  Bed Mobility Overal bed mobility: Modified Independent                Transfers Overall transfer level: Needs assistance   Transfers: Sit to/from Stand Sit to Stand: Min guard         General transfer comment: close guard for safety.   Ambulation/Gait Ambulation/Gait assistance: Min guard Ambulation Distance (Feet): 60 Feet Assistive device: (IV pole) Gait Pattern/deviations: Step-through pattern;Decreased stride length;Decreased step length - right     General Gait Details: close guard for safety. Pt stated she felt like she was dragging her R LE when ambulating. slow gait speed.   Stairs            Wheelchair Mobility    Modified Rankin (Stroke Patients Only)       Balance Overall balance assessment: Needs assistance           Standing balance-Leahy Scale: Fair                               Pertinent Vitals/Pain Pain Assessment: 0-10 Pain Score: 8  Pain Location: abdomen Pain Intervention(s): Monitored during session    Home Living Family/patient expects to be discharged to:: Private  residence Living Arrangements: Spouse/significant other   Type of Home: Apartment Home Access: Stairs to enter     Home Layout: One level Home Equipment: Gilmer Mor - single point      Prior Function Level of Independence: Independent               Hand Dominance        Extremity/Trunk Assessment   Upper Extremity Assessment Upper Extremity Assessment: Overall WFL for tasks assessed    Lower Extremity Assessment Lower Extremity Assessment: Generalized weakness(R leg appears slightly weaker than L)    Cervical / Trunk Assessment Cervical / Trunk Assessment: Normal  Communication   Communication: No difficulties  Cognition Arousal/Alertness: Awake/alert Behavior During Therapy: WFL for tasks assessed/performed Overall Cognitive Status: Within Functional Limits for tasks assessed                                        General Comments      Exercises     Assessment/Plan    PT Assessment Patient needs continued PT services  PT Problem List Decreased mobility;Decreased balance;Decreased activity tolerance;Decreased cognition;Decreased strength       PT Treatment Interventions DME instruction;Functional mobility training;Gait training;Therapeutic activities;Balance training;Patient/family education;Therapeutic exercise    PT Goals (Current goals can be found in the Care Plan  section)  Acute Rehab PT Goals Patient Stated Goal: to feel better PT Goal Formulation: With patient Time For Goal Achievement: 02/14/18 Potential to Achieve Goals: Good    Frequency Min 3X/week   Barriers to discharge        Co-evaluation               AM-PAC PT "6 Clicks" Daily Activity  Outcome Measure Difficulty turning over in bed (including adjusting bedclothes, sheets and blankets)?: None Difficulty moving from lying on back to sitting on the side of the bed? : None Difficulty sitting down on and standing up from a chair with arms (e.g., wheelchair,  bedside commode, etc,.)?: A Little Help needed moving to and from a bed to chair (including a wheelchair)?: A Little Help needed walking in hospital room?: A Little Help needed climbing 3-5 steps with a railing? : A Little 6 Click Score: 20    End of Session Equipment Utilized During Treatment: Gait belt Activity Tolerance: Patient limited by fatigue Patient left: in bed;with call bell/phone within reach   PT Visit Diagnosis: Muscle weakness (generalized) (M62.81);Difficulty in walking, not elsewhere classified (R26.2)    Time: [id number] PT Time Calculation (min) (ACUTE ONLY): 34 min   Charges:   PT Evaluation $PT Eval Moderate Complexity: 1 Mod PT Treatments $Gait Training: 8-22 mins   PT G Codes:          Rebeca AlertJannie Maygen Sirico, MPT Pager: [phone]

## 2018-01-31 NOTE — Progress Notes (Addendum)
Initial Nutrition Assessment  DOCUMENTATION CODES:   Not applicable  INTERVENTION:    Ensure Enlive po BID, each supplement provides 350 kcal and 20 grams of protein   NUTRITION DIAGNOSIS:   Inadequate oral intake related to decreased appetite, other (see comment)(reported swallowing difficulty) as evidenced by per patient/family report.  GOAL:   Patient will meet greater than or equal to 90% of their needs  MONITOR:   PO intake, Supplement acceptance, Weight trends, Labs, Diet advancement  REASON FOR ASSESSMENT:   Malnutrition Screening Tool    ASSESSMENT:   Patient with PMH significant for CAD, partial colectomy for invasive polyp and take back postop infection, HTN, HLD, and COPD. Presents this admission with complaints of cough and abdominal pain. Admitted for SIRS secondary to influenza.    Spoke with pt at bedside. Reports appetite varies day to day. Some days she will eat over three meals and others only bites.  Pt unable to provide dietary recall.  Pt endorses consuming one regular ensure at home. Discussed swallowing issues. Pt describes feelings of food being stuck in her esophagus upon eating anything. Pt had pureed breakfast this morning and did not have that feeling. SLP to to assess swallowing ability. Pt has not consumed Ensure this admission but would like for them to be sent.   Pt endorses a UBW of 149 lb, unsure of the last time she was at that weight. Records indicate pt weighed 141 lb on 2/20 and 129 lb this admission 8.5% (wt loss in one month, significant for time frame). Nutrition-Focused physical exam completed. Given lack of intake history hard to diagnose malnutrition at this time. Will monitor weight trends closely.   Medications reviewed and include: NS with 20 mEq KCl @ 100 ml/hr Labs reviewed: Creatinine 1.17 (H)   NUTRITION - FOCUSED PHYSICAL EXAM:    Most Recent Value  Orbital Region  No depletion  Upper Arm Region  No depletion  Thoracic  and Lumbar Region  Unable to assess  Buccal Region  No depletion  Temple Region  No depletion  Clavicle Bone Region  No depletion  Clavicle and Acromion Bone Region  No depletion  Scapular Bone Region  Unable to assess  Dorsal Hand  No depletion  Patellar Region  Mild depletion  Anterior Thigh Region  Mild depletion  Posterior Calf Region  Mild depletion  Edema (RD Assessment)  None  Hair  Reviewed  Eyes  Reviewed  Mouth  Reviewed  Skin  Reviewed  Nails  Reviewed     Diet Order:  DIET - DYS 1 Room service appropriate? Yes; Fluid consistency: Thin  EDUCATION NEEDS:   Education needs have been addressed  Skin:  Skin Assessment: Reviewed RN Assessment  Last BM:  01/31/18  Height:   Ht Readings from Last 1 Encounters:  01/30/18 4\' 9"  (1.448 m)    Weight:   Wt Readings from Last 1 Encounters:  01/30/18 129 lb 1.6 oz (58.6 kg)    Ideal Body Weight:  43.2 kg  BMI:  Body mass index is 27.94 kg/m.  Estimated Nutritional Needs:   Kcal:  1200-1400 kcal/day  Protein:  60-70 g/day  Fluid:  >1.2 L/day    Vanessa Kickarly Terrion Gencarelli RD, LDN Clinical Nutrition Pager # - [phone]

## 2018-01-31 NOTE — Plan of Care (Signed)
  Problem: Education: Goal: Knowledge of General Education information will improve Outcome: Progressing   Problem: Health Behavior/Discharge Planning: Goal: Ability to manage health-related needs will improve Outcome: Progressing   Problem: Clinical Measurements: Goal: Ability to maintain clinical measurements within normal limits will improve Outcome: Progressing   Problem: Nutrition: Goal: Adequate nutrition will be maintained Outcome: Progressing   Problem: Elimination: Goal: Will not experience complications related to urinary retention Outcome: Progressing   Problem: Pain Managment: Goal: General experience of comfort will improve Outcome: Progressing   Problem: Safety: Goal: Ability to remain free from injury will improve Outcome: Progressing   

## 2018-01-31 NOTE — Care Management Obs Status (Signed)
MEDICARE OBSERVATION STATUS NOTIFICATION   Patient Details  Name: Katie [name] MRN: [id number] Date of Birth: 1933/05/15   Medicare Observation Status Notification Given:  Yes    Lanier ClamMahabir, Aira Sallade, RN 01/31/2018, 4:43 PM

## 2018-01-31 NOTE — Progress Notes (Signed)
Pharmacy Antibiotic Note  Katie [name] is a 84 y.o. female admitted on 01/30/2018 with pneumonia.  Pharmacy has been consulted for Vancomycin, cefepime dosing.  Plan: Cefepime 2gm iv x1, then 1gm iv q24hr  Vancomycin 1gm iv x1, then 500mg  iv q48hr Goal AUC = 400 - 500 for all indications, except meningitis (goal AUC > 500 and Cmin 15-20 mcg/mL)   Height: 4\' 9"  (144.8 cm) Weight: 135 lb (61.2 kg) IBW/kg (Calculated) : 38.6  Temp (24hrs), Avg:100.3 F (37.9 C), Min:100.1 F (37.8 C), Max:100.5 F (38.1 C)  Recent Labs  Lab 01/30/18 1133 01/30/18 2245 01/31/18 0207  WBC 10.1  --   --   CREATININE 1.47*  --   --   LATICACIDVEN  --  2.0* 1.5    Estimated Creatinine Clearance: 22.2 mL/min (A) (by C-G formula based on SCr of 1.47 mg/dL (H)).    Allergies  Allergen Reactions  . Clams [Shellfish Allergy] Anaphylaxis  . Hydralazine Other (See Comments)    Possible cause of chest discomfort  . Red Dye Other (See Comments)    Constipation   . Sesame Seed [Sesame Oil] Swelling and Other (See Comments)    Wheezing     Antimicrobials this admission: Vancomycin 01/30/2018 >> Cefepime 01/30/2018 >>   Dose adjustments this admission: -  Microbiology results: -  Thank you for allowing pharmacy to be a part of this patient's care.  Katie [name], Katie [name] 01/31/2018 5:11 AM

## 2018-01-31 NOTE — Progress Notes (Signed)
[facility] PROGRESS NOTE  Katie [name] ZOX:[id number] DOB: December 18, 1932 DOA: 01/30/2018   PCP: Macy Mis, MD     Assessment/Plan: Active Problems:   AKI (acute kidney injury) (HCC)   CAD (coronary artery disease)   HTN (hypertension)   Hypothyroidism   Generalized abdominal tenderness   Hypokalemia   Sepsis (HCC)   SIRS (systemic inflammatory response syndrome) (HCC)   Influenza due to influenza virus, type A, human     84 y.o. female with medical history significant of  CAD, partial colectomy for invasive polyp and take back for postop infection remotely, HTN, hypothyroidism, HLD, and COPD who presents with cough and abdominal pain secondary to influenza A  Assessment and plan . SIRS (systemic inflammatory response syndrome) (HCC), without any evidence of sepsis. No growth on blood cultures thus far. Will discontinue empiric antibiotics. Symptoms likely secondary to influenza A. ContinueTamiflu, continue hydration with IV fluids. Follow blood cultures closely and resume antibiotics if needed    . Hypokalemia/dehydration - will replace, check magnesium level   . HTN (hypertension) restart home medications given accelerated hypertension  . Hypothyroidism -restart Synthroid  TSH 2.64  . Generalized abdominal tenderness  -likely related to viral illness CT of abdomen unremarkable  . CAD (coronary artery disease) -  - chronic, continue aspirin and statin     . AKI (acute kidney injury) (HCC) - - likely secondary to dehydration, creatinine has improved from 1.47> 1.17, continue IV fluids   Dysphagia patient states she feels like food is getting stuck in her throat.  Order speech pathology dysphagia evaluation.  Be secondary to general globulus sensation in the setting of acute illness   History of COPD currently no evidence of wheezing continue as needed nebulizers as needed scheduled DuoNeb Other plan as per orders.   DVT prophylaxsis Lovenox  Code  Status:  full code    Family Communication: Discussed in detail with the patient, all imaging results, lab results explained to the patient   Disposition Plan:   Anticipate discharge tomorrow if afebrile for 24 hours     Consultants:  none  Procedures:  none  Antibiotics: Anti-infectives (From admission, onward)   Start     Dose/Rate Route Frequency Ordered Stop   02/01/18 2200  vancomycin (VANCOCIN) 500 mg in sodium chloride 0.9 % 100 mL IVPB  Status:  Discontinued     500 mg 100 mL/hr over 60 Minutes Intravenous Every 48 hours 01/31/18 0510 01/31/18 0945   01/31/18 2200  ceFEPIme (MAXIPIME) 1 g in sodium chloride 0.9 % 100 mL IVPB  Status:  Discontinued     1 g 200 mL/hr over 30 Minutes Intravenous Every 24 hours 01/31/18 0510 01/31/18 0945   01/30/18 2300  azithromycin (ZITHROMAX) 500 mg in sodium chloride 0.9 % 250 mL IVPB  Status:  Discontinued     500 mg 250 mL/hr over 60 Minutes Intravenous Every 24 hours 01/30/18 2211 01/31/18 0945   01/30/18 2300  oseltamivir (TAMIFLU) capsule 75 mg     75 mg Oral 2 times daily 01/30/18 2257 02/04/18 2159   01/30/18 2230  vancomycin (VANCOCIN) IVPB 1000 mg/200 mL premix     1,000 mg 200 mL/hr over 60 Minutes Intravenous  Once 01/30/18 2216 01/31/18 0044   01/30/18 2230  ceFEPIme (MAXIPIME) 2 g in sodium chloride 0.9 % 100 mL IVPB     2 g 200 mL/hr over 30 Minutes Intravenous  Once 01/30/18 2216 01/30/18 2325  HPI/Subjective: Patient complains of generalized myalgias and body aches, still feels very weak and lightheaded, denies any nausea vomiting abdominal pain able to eat and drink  Objective: Vitals:   01/30/18 1734 01/30/18 2030 01/30/18 2100 01/31/18 0615  BP: (!) 175/72 (!) 198/79 (!) 198/85 (!) 158/52  Pulse: 92 95 86 97  Resp: (!) 23 (!) 22 (!) 21 (!) 21  Temp:  100.1 F (37.8 C) (!) 101.2 F (38.4 C) (!) 101.4 F (38.6 C)  TempSrc:  Oral Oral Oral  SpO2: 95% 95% 98% 93%  Weight:   58.6 kg (129 lb 1.6  oz)   Height:   4\' 9"  (1.448 m)     Intake/Output Summary (Last 24 hours) at 01/31/2018 1019 Last data filed at 01/31/2018 0549 Gross per 24 hour  Intake 1027.92 ml  Output -  Net 1027.92 ml    Exam:  Examination:  General exam: Appears calm and comfortable  Respiratory system: Clear to auscultation. Respiratory effort normal. Cardiovascular system: S1 & S2 heard, RRR. No JVD, murmurs, rubs, gallops or clicks. No pedal edema. Gastrointestinal system: Abdomen is nondistended, soft and nontender. No organomegaly or masses felt. Normal bowel sounds heard. Central nervous system: Alert and oriented. No focal neurological deficits. Extremities: Symmetric 5 x 5 power. Skin: No rashes, lesions or ulcers Psychiatry: Judgement and insight appear normal. Mood & affect appropriate.     Data Reviewed: I have personally reviewed following labs and imaging studies  Micro Results Recent Results (from the past 240 hour(s))  MRSA PCR Screening     Status: None   Collection Time: 01/30/18 11:12 PM  Result Value Ref Range Status   MRSA by PCR NEGATIVE NEGATIVE Final    Comment:        The GeneXpert MRSA Assay (FDA approved for NASAL specimens only), is one component of a comprehensive MRSA colonization surveillance program. It is not intended to diagnose MRSA infection nor to guide or monitor treatment for MRSA infections. Performed at [facility], [address] [place], [place] [address]   C difficile quick scan w PCR reflex     Status: None   Collection Time: 01/30/18 11:12 PM  Result Value Ref Range Status   C Diff antigen NEGATIVE NEGATIVE Final   C Diff toxin NEGATIVE NEGATIVE Final   C Diff interpretation No C. difficile detected.  Final    Comment: Performed at [facility], [address] [place], [place] [address]    Radiology Reports Ct Abdomen Pelvis Wo Contrast  Result Date: 01/30/2018 CLINICAL DATA:  84 y/o  F; coughing,  sore throat, abdominal pain. EXAM: CT ABDOMEN AND PELVIS WITHOUT CONTRAST TECHNIQUE: Multidetector CT imaging of the abdomen and pelvis was performed following the standard protocol without IV contrast. COMPARISON:  12/20/2017 CT abdomen and pelvis FINDINGS: Lower chest: Cardiomegaly and coronary artery calcification. Hepatobiliary: Stable liver cysts. No other focal liver lesion. No gallbladder wall thickening or gallstone. No biliary ductal dilatation. Pancreas: Unremarkable. No pancreatic ductal dilatation or surrounding inflammatory changes. Spleen: Stable focus of splenic calcification. Otherwise no focal splenic lesion. Adrenals/Urinary Tract: Adrenal glands are unremarkable. Multiple renal hilar calcifications, likely vascular. Kidneys are normal, without renal calculi, focal lesion, or hydronephrosis. Bladder is unremarkable. Stomach/Bowel: Stomach is within normal limits. Scattered sigmoid diverticulosis. No evidence of bowel wall thickening, distention, or inflammatory changes. Vascular/Lymphatic: Aortic and iliofemoral atherosclerosis with severe calcification. No enlarged abdominal or pelvic lymph nodes. Reproductive: Uterus and bilateral adnexa are unremarkable. Other: No abdominal  wall hernia or abnormality. No abdominopelvic ascites. Musculoskeletal: No fracture is seen. Moderate degenerative changes of the lumbar spine with discogenic degenerative disease greatest at L2-L4 and extensive lower lumbar facet arthrosis. IMPRESSION: 1. No acute process identified. 2. Coronary and aortic calcific atherosclerosis. Stable cardiomegaly. 3. Stable liver cysts. 4. Scattered sigmoid diverticulosis, no findings of diverticulitis. 5. Moderate degenerative changes of lumbar spine. Electronically Signed   By: Mitzi Hansen M.D.   On: 01/30/2018 18:24   Dg Abdomen 1 View  Result Date: 01/27/2018 CLINICAL DATA:  Constipation. EXAM: ABDOMEN - 1 VIEW COMPARISON:  Radiograph 12/25/2017 FINDINGS: Stool  distends the rectum with rectal dimension of 7.1 cm. Small to moderate stool throughout the remainder the colon. No small bowel dilatation to suggest obstruction. There are vascular calcifications. No evidence of free air. IMPRESSION: Stool distended rectum to 7.1 cm. Small to moderate stool throughout the remainder the colon. No bowel obstruction. Electronically Signed   By: Rubye Oaks M.D.   On: 01/27/2018 02:48   Dg Abdomen Acute W/chest  Result Date: 01/30/2018 CLINICAL DATA:  Abdominal pain, cough EXAM: DG ABDOMEN ACUTE W/ 1V CHEST COMPARISON:  01/27/2018 FINDINGS: Cardiac enlargement. Median sternotomy. Atherosclerotic calcification of the ascending aorta and aortic arch and descending aorta. Negative for heart failure. No edema or effusion. Mild left lower lobe atelectasis with elevated left hemidiaphragm. Nonobstructive bowel gas pattern. No air-fluid levels in the bowel. No free air. Atherosclerotic disease. Lumbar disc degeneration and spurring L3-4 and L4-5. No acute skeletal abnormality. Degenerative change in the pubic symphysis. IMPRESSION: Mild left lower lobe atelectasis. No acute cardiopulmonary abnormality Normal bowel gas pattern.  No acute abnormality in the abdomen. Aortic Atherosclerosis (ICD10-I70.0). Electronically Signed   By: Marlan Palau M.D.   On: 01/30/2018 12:04     CBC Recent Labs  Lab 01/30/18 1133 01/31/18 0601  WBC 10.1 7.0  HGB 12.5 11.6*  HCT 38.0 34.7*  PLT 239 221  MCV 93.4 92.0  MCH 30.7 30.8  MCHC 32.9 33.4  RDW 14.6 14.6  LYMPHSABS 0.9  --   MONOABS 1.1*  --   EOSABS 0.0  --   BASOSABS 0.0  --     Chemistries  Recent Labs  Lab 01/30/18 1133 01/31/18 0207 01/31/18 0601  NA 137  --  136  K 3.2*  --  3.6  CL 100*  --  105  CO2 26  --  23  GLUCOSE 107*  --  94  BUN 11  --  8  CREATININE 1.47*  --  1.17*  CALCIUM 9.3  --  8.5*  MG  --  1.9 1.8  AST 34 47* 38  ALT 17 18 17   ALKPHOS 50 48 41  BILITOT 0.8 0.9 0.6    ------------------------------------------------------------------------------------------------------------------ estimated creatinine clearance is 27.3 mL/min (A) (by C-G formula based on SCr of 1.17 mg/dL (H)). ------------------------------------------------------------------------------------------------------------------ No results for input(s): HGBA1C in the last 72 hours. ------------------------------------------------------------------------------------------------------------------ No results for input(s): CHOL, HDL, LDLCALC, TRIG, CHOLHDL, LDLDIRECT in the last 72 hours. ------------------------------------------------------------------------------------------------------------------ Recent Labs    01/31/18 0601  TSH 2.642   ------------------------------------------------------------------------------------------------------------------ No results for input(s): VITAMINB12, FOLATE, FERRITIN, TIBC, IRON, RETICCTPCT in the last 72 hours.  Coagulation profile No results for input(s): INR, PROTIME in the last 168 hours.  No results for input(s): DDIMER in the last 72 hours.  Cardiac Enzymes No results for input(s): CKMB, TROPONINI, MYOGLOBIN in the last 168 hours.  Invalid input(s): CK ------------------------------------------------------------------------------------------------------------------ Invalid input(s): POCBNP   CBG: No results  for input(s): GLUCAP in the last 168 hours.     Studies: Ct Abdomen Pelvis Wo Contrast  Result Date: 01/30/2018 CLINICAL DATA:  84 y/o  F; coughing, sore throat, abdominal pain. EXAM: CT ABDOMEN AND PELVIS WITHOUT CONTRAST TECHNIQUE: Multidetector CT imaging of the abdomen and pelvis was performed following the standard protocol without IV contrast. COMPARISON:  12/20/2017 CT abdomen and pelvis FINDINGS: Lower chest: Cardiomegaly and coronary artery calcification. Hepatobiliary: Stable liver cysts. No other focal liver lesion. No  gallbladder wall thickening or gallstone. No biliary ductal dilatation. Pancreas: Unremarkable. No pancreatic ductal dilatation or surrounding inflammatory changes. Spleen: Stable focus of splenic calcification. Otherwise no focal splenic lesion. Adrenals/Urinary Tract: Adrenal glands are unremarkable. Multiple renal hilar calcifications, likely vascular. Kidneys are normal, without renal calculi, focal lesion, or hydronephrosis. Bladder is unremarkable. Stomach/Bowel: Stomach is within normal limits. Scattered sigmoid diverticulosis. No evidence of bowel wall thickening, distention, or inflammatory changes. Vascular/Lymphatic: Aortic and iliofemoral atherosclerosis with severe calcification. No enlarged abdominal or pelvic lymph nodes. Reproductive: Uterus and bilateral adnexa are unremarkable. Other: No abdominal wall hernia or abnormality. No abdominopelvic ascites. Musculoskeletal: No fracture is seen. Moderate degenerative changes of the lumbar spine with discogenic degenerative disease greatest at L2-L4 and extensive lower lumbar facet arthrosis. IMPRESSION: 1. No acute process identified. 2. Coronary and aortic calcific atherosclerosis. Stable cardiomegaly. 3. Stable liver cysts. 4. Scattered sigmoid diverticulosis, no findings of diverticulitis. 5. Moderate degenerative changes of lumbar spine. Electronically Signed   By: Mitzi Hansen M.D.   On: 01/30/2018 18:24   Dg Abdomen Acute W/chest  Result Date: 01/30/2018 CLINICAL DATA:  Abdominal pain, cough EXAM: DG ABDOMEN ACUTE W/ 1V CHEST COMPARISON:  01/27/2018 FINDINGS: Cardiac enlargement. Median sternotomy. Atherosclerotic calcification of the ascending aorta and aortic arch and descending aorta. Negative for heart failure. No edema or effusion. Mild left lower lobe atelectasis with elevated left hemidiaphragm. Nonobstructive bowel gas pattern. No air-fluid levels in the bowel. No free air. Atherosclerotic disease. Lumbar disc degeneration  and spurring L3-4 and L4-5. No acute skeletal abnormality. Degenerative change in the pubic symphysis. IMPRESSION: Mild left lower lobe atelectasis. No acute cardiopulmonary abnormality Normal bowel gas pattern.  No acute abnormality in the abdomen. Aortic Atherosclerosis (ICD10-I70.0). Electronically Signed   By: Marlan Palau M.D.   On: 01/30/2018 12:04      No results found for: HGBA1C Lab Results  Component Value Date   CREATININE 1.17 (H) 01/31/2018       Scheduled Meds: . amLODipine  5 mg Oral Daily  . aspirin  81 mg Oral Daily  . chlorhexidine  15 mL Mouth Rinse BID  . cloNIDine  0.1 mg Oral BID  . diltiazem  240 mg Oral Daily  . enoxaparin (LOVENOX) injection  30 mg Subcutaneous Q24H  . feeding supplement (ENSURE ENLIVE)  237 mL Oral BID BM  . guaiFENesin  600 mg Oral BID  . isosorbide-hydrALAZINE  1 tablet Oral TID  . levothyroxine  50 mcg Oral QAC breakfast  . mouth rinse  15 mL Mouth Rinse q12n4p  . montelukast  5 mg Oral QHS  . oseltamivir  75 mg Oral BID  . pravastatin  40 mg Oral Daily   Continuous Infusions: . 0.9 % NaCl with KCl 20 mEq / L       LOS: 0 days    Time spent: >30 MINS    Richarda Overlie  [facility] Pager [phone]. If 7PM-7AM, please contact night-coverage at [email or web address], password [facility] 01/31/2018, 10:19 AM  LOS:  0 days

## 2018-01-31 NOTE — Care Management Note (Signed)
Case Management Note  Patient Details  Name: Lance MorinMary Agnes Ragain MRN: [id number] Date of Birth: [birth year]  Subjective/Objective: 84 y/o f admitted w/hypokalemia. From home w/spouse. Has rw. PT recc HHPT. Provided w/HHC agency list await choice.                   Action/Plan:d/c home w/HHC.   Expected Discharge Date:  (unknown)               Expected Discharge Plan:  Home w Home Health Services  In-House Referral:     Discharge planning Services  CM Consult  Post Acute Care Choice:  Durable Medical Equipment(rw) Choice offered to:     DME Arranged:    DME Agency:     HH Arranged:    HH Agency:     Status of Service:  In process, will continue to follow  If discussed at Long Length of Stay Meetings, dates discussed:    Additional Comments:  Lanier ClamMahabir, Areebah Meinders, RN 01/31/2018, 4:44 PM

## 2018-02-01 DIAGNOSIS — N179 Acute kidney failure, unspecified: Secondary | ICD-10-CM | POA: Diagnosis not present

## 2018-02-01 DIAGNOSIS — J101 Influenza due to other identified influenza virus with other respiratory manifestations: Secondary | ICD-10-CM | POA: Diagnosis not present

## 2018-02-01 DIAGNOSIS — I10 Essential (primary) hypertension: Secondary | ICD-10-CM | POA: Diagnosis not present

## 2018-02-01 DIAGNOSIS — I2510 Atherosclerotic heart disease of native coronary artery without angina pectoris: Secondary | ICD-10-CM | POA: Diagnosis not present

## 2018-02-01 LAB — BASIC METABOLIC PANEL
ANION GAP: 8 (ref 5–15)
BUN: 9 mg/dL (ref 6–20)
CHLORIDE: 108 mmol/L (ref 101–111)
CO2: 23 mmol/L (ref 22–32)
Calcium: 8.7 mg/dL — ABNORMAL LOW (ref 8.9–10.3)
Creatinine, Ser: 1.21 mg/dL — ABNORMAL HIGH (ref 0.44–1.00)
GFR calc non Af Amer: 41 mL/min — ABNORMAL LOW (ref >60)
GFR, EST AFRICAN AMERICAN: 47 mL/min — AB (ref >60)
Glucose, Bld: 84 mg/dL (ref 65–99)
Potassium: 3.8 mmol/L (ref 3.5–5.1)
SODIUM: 139 mmol/L (ref 135–145)

## 2018-02-01 MED ORDER — AMLODIPINE BESYLATE 5 MG PO TABS: 5.0000 mg | ORAL_TABLET | Freq: Every day | ORAL | 2 refills | Status: AC

## 2018-02-01 MED ORDER — CLONIDINE HCL 0.1 MG PO TABS: 0.1000 mg | ORAL_TABLET | Freq: Two times a day (BID) | ORAL | 11 refills | Status: DC

## 2018-02-01 MED ORDER — GUAIFENESIN ER 600 MG PO TB12: 600.0000 mg | ORAL_TABLET | Freq: Two times a day (BID) | ORAL | 0 refills | Status: AC

## 2018-02-01 MED ORDER — OSELTAMIVIR PHOSPHATE 30 MG PO CAPS: 30.0000 mg | ORAL_CAPSULE | Freq: Two times a day (BID) | ORAL | 0 refills | Status: AC

## 2018-02-01 NOTE — Care Management Note (Signed)
Case Management Note  Patient Details  Name: Katie MorinMary Agnes [name] MRN: [id number] Date of Birth: [birth year]  Subjective/Objective: [facility]-HHPT.No further CM needs.                   Action/Plan:d/c home w/HHC.   Expected Discharge Date:  02/01/18               Expected Discharge Plan:  Home w Home Health Services  In-House Referral:     Discharge planning Services  CM Consult  Post Acute Care Choice:  Durable Medical Equipment(rw) Choice offered to:  Patient  DME Arranged:    DME Agency:     HH Arranged:  PT HH Agency:  [facility]  Status of Service:  Completed, signed off  If discussed at Long Length of Stay Meetings, dates discussed:    Additional Comments:  Lanier ClamMahabir, Kynli Chou, RN 02/01/2018, 9:14 AM

## 2018-02-01 NOTE — Discharge Summary (Signed)
Physician Discharge Summary  Katie [name] MRN: [id number] DOB/AGE: Mar 09, 1933 84 y.o.  PCP: Katherina Mires, MD   Admit date: 01/30/2018 Discharge date: 02/01/2018  Discharge Diagnoses:    Active Problems:   AKI (acute kidney injury) ([place])   CAD (coronary artery disease)   HTN (hypertension)   Hypothyroidism   Generalized abdominal tenderness   Hypokalemia   Sepsis ([place])   SIRS (systemic inflammatory response syndrome) (HCC)   Influenza due to influenza virus, type A, human    Follow-up recommendations Follow-up with PCP in 3-5 days , including all  additional recommended appointments as below Follow-up CBC, CMP in 3-5 days Patient discharged with home health      Allergies as of 02/01/2018      Reactions   Clams [shellfish Allergy] Anaphylaxis   Hydralazine Other (See Comments)   Possible cause of chest discomfort   Red Dye Other (See Comments)   Constipation   Sesame Seed [sesame Oil] Swelling, Other (See Comments)   Wheezing      Medication List    TAKE these medications   amLODipine 5 MG tablet Commonly known as:  NORVASC Take 1 tablet (5 mg total) by mouth daily.   aspirin 81 MG chewable tablet Chew 81 mg by mouth daily.   cloNIDine 0.1 MG tablet Commonly known as:  CATAPRES Take 1 tablet (0.1 mg total) by mouth 2 (two) times daily. Start taking on:  02/06/2018 What changed:  These instructions start on 02/06/2018. If you are unsure what to do until then, ask your doctor or other care provider.   diltiazem 240 MG 24 hr capsule Commonly known as:  CARDIZEM CD Take 240 mg by mouth daily.   ENSURE PLUS Liqd Take 237 mLs by mouth daily.   guaiFENesin 600 MG 12 hr tablet Commonly known as:  MUCINEX Take 1 tablet (600 mg total) by mouth 2 (two) times daily for 7 days.   isosorbide-hydrALAZINE 20-37.5 MG tablet Commonly known as:  BIDIL Take 1 tablet by mouth 3 (three) times daily.   levothyroxine 50 MCG tablet Commonly known as:  SYNTHROID,  LEVOTHROID Take 50 mcg by mouth daily.   montelukast 5 MG chewable tablet Commonly known as:  SINGULAIR Chew 5 mg by mouth at bedtime.   multivitamin with minerals Tabs tablet Take 1 tablet by mouth daily.   oseltamivir 30 MG capsule Commonly known as:  TAMIFLU Take 1 capsule (30 mg total) by mouth 2 (two) times daily for 4 days.   pravastatin 40 MG tablet Commonly known as:  PRAVACHOL Take 40 mg by mouth daily.   vitamin B-12 1000 MCG tablet Commonly known as:  CYANOCOBALAMIN Take 1,000 mcg by mouth daily.   Vitamin D-3 1000 units Caps Take 1,000 Units by mouth daily.        Discharge Condition: discharge in stable condition with husband  Discharge Instructions Get Medicines reviewed and adjusted: Please take all your medications with you for your next visit with your Primary MD  Please request your Primary MD to go over all hospital tests and procedure/radiological results at the follow up, please ask your Primary MD to get all Hospital records sent to his/her office.  If you experience worsening of your admission symptoms, develop shortness of breath, life threatening emergency, suicidal or homicidal thoughts you must seek medical attention immediately by calling 911 or calling your MD immediately if symptoms less severe.  You must read complete instructions/literature along with all the possible adverse reactions/side effects for all  the Medicines you take and that have been prescribed to you. Take any new Medicines after you have completely understood and accpet all the possible adverse reactions/side effects.   Do not drive when taking Pain medications.   Do not take more than prescribed Pain, Sleep and Anxiety Medications  Special Instructions: If you have smoked or chewed Tobacco in the last 2 yrs please stop smoking, stop any regular Alcohol and or any Recreational drug use.  Wear Seat belts while driving.  Please note  You were cared for by a  hospitalist during your hospital stay. Once you are discharged, your primary care physician will handle any further medical issues. Please note that NO REFILLS for any discharge medications will be authorized once you are discharged, as it is imperative that you return to your primary care physician (or establish a relationship with a primary care physician if you do not have one) for your aftercare needs so that they can reassess your need for medications and monitor your lab values.     Allergies  Allergen Reactions  . Clams [Shellfish Allergy] Anaphylaxis  . Hydralazine Other (See Comments)    Possible cause of chest discomfort  . Red Dye Other (See Comments)    Constipation   . Sesame Seed [Sesame Oil] Swelling and Other (See Comments)    Wheezing       Disposition: Discharge disposition: 01-Home or Self Care        Consults:  None*    Significant Diagnostic Studies:  Ct Abdomen Pelvis Wo Contrast  Result Date: 01/30/2018 CLINICAL DATA:  84 y/o  F; coughing, sore throat, abdominal pain. EXAM: CT ABDOMEN AND PELVIS WITHOUT CONTRAST TECHNIQUE: Multidetector CT imaging of the abdomen and pelvis was performed following the standard protocol without IV contrast. COMPARISON:  12/20/2017 CT abdomen and pelvis FINDINGS: Lower chest: Cardiomegaly and coronary artery calcification. Hepatobiliary: Stable liver cysts. No other focal liver lesion. No gallbladder wall thickening or gallstone. No biliary ductal dilatation. Pancreas: Unremarkable. No pancreatic ductal dilatation or surrounding inflammatory changes. Spleen: Stable focus of splenic calcification. Otherwise no focal splenic lesion. Adrenals/Urinary Tract: Adrenal glands are unremarkable. Multiple renal hilar calcifications, likely vascular. Kidneys are normal, without renal calculi, focal lesion, or hydronephrosis. Bladder is unremarkable. Stomach/Bowel: Stomach is within normal limits. Scattered sigmoid diverticulosis. No  evidence of bowel wall thickening, distention, or inflammatory changes. Vascular/Lymphatic: Aortic and iliofemoral atherosclerosis with severe calcification. No enlarged abdominal or pelvic lymph nodes. Reproductive: Uterus and bilateral adnexa are unremarkable. Other: No abdominal wall hernia or abnormality. No abdominopelvic ascites. Musculoskeletal: No fracture is seen. Moderate degenerative changes of the lumbar spine with discogenic degenerative disease greatest at L2-L4 and extensive lower lumbar facet arthrosis. IMPRESSION: 1. No acute process identified. 2. Coronary and aortic calcific atherosclerosis. Stable cardiomegaly. 3. Stable liver cysts. 4. Scattered sigmoid diverticulosis, no findings of diverticulitis. 5. Moderate degenerative changes of lumbar spine. Electronically Signed   By: Kristine Garbe M.D.   On: 01/30/2018 18:24   Dg Abdomen 1 View  Result Date: 01/27/2018 CLINICAL DATA:  Constipation. EXAM: ABDOMEN - 1 VIEW COMPARISON:  Radiograph 12/25/2017 FINDINGS: Stool distends the rectum with rectal dimension of 7.1 cm. Small to moderate stool throughout the remainder the colon. No small bowel dilatation to suggest obstruction. There are vascular calcifications. No evidence of free air. IMPRESSION: Stool distended rectum to 7.1 cm. Small to moderate stool throughout the remainder the colon. No bowel obstruction. Electronically Signed   By: Jeb Levering M.D.   On:  01/27/2018 02:48   Dg Abdomen Acute W/chest  Result Date: 01/30/2018 CLINICAL DATA:  Abdominal pain, cough EXAM: DG ABDOMEN ACUTE W/ 1V CHEST COMPARISON:  01/27/2018 FINDINGS: Cardiac enlargement. Median sternotomy. Atherosclerotic calcification of the ascending aorta and aortic arch and descending aorta. Negative for heart failure. No edema or effusion. Mild left lower lobe atelectasis with elevated left hemidiaphragm. Nonobstructive bowel gas pattern. No air-fluid levels in the bowel. No free air. Atherosclerotic  disease. Lumbar disc degeneration and spurring L3-4 and L4-5. No acute skeletal abnormality. Degenerative change in the pubic symphysis. IMPRESSION: Mild left lower lobe atelectasis. No acute cardiopulmonary abnormality Normal bowel gas pattern.  No acute abnormality in the abdomen. Aortic Atherosclerosis (ICD10-I70.0). Electronically Signed   By: Franchot Gallo M.D.   On: 01/30/2018 12:04          Filed Weights   01/30/18 0953 01/30/18 2100  Weight: 61.2 kg (135 lb) 58.6 kg (129 lb 1.6 oz)     Microbiology: Recent Results (from the past 240 hour(s))  Respiratory Panel by PCR     Status: Abnormal   Collection Time: 01/30/18  7:28 PM  Result Value Ref Range Status   Adenovirus NOT DETECTED NOT DETECTED Final   Coronavirus 229E NOT DETECTED NOT DETECTED Final   Coronavirus HKU1 NOT DETECTED NOT DETECTED Final   Coronavirus NL63 NOT DETECTED NOT DETECTED Final   Coronavirus OC43 NOT DETECTED NOT DETECTED Final   Metapneumovirus NOT DETECTED NOT DETECTED Final   Rhinovirus / Enterovirus NOT DETECTED NOT DETECTED Final   Influenza A H3 DETECTED (A) NOT DETECTED Final   Influenza B NOT DETECTED NOT DETECTED Final   Parainfluenza Virus 1 NOT DETECTED NOT DETECTED Final   Parainfluenza Virus 2 NOT DETECTED NOT DETECTED Final   Parainfluenza Virus 3 NOT DETECTED NOT DETECTED Final   Parainfluenza Virus 4 NOT DETECTED NOT DETECTED Final   Respiratory Syncytial Virus NOT DETECTED NOT DETECTED Final   Bordetella pertussis NOT DETECTED NOT DETECTED Final   Chlamydophila pneumoniae NOT DETECTED NOT DETECTED Final   Mycoplasma pneumoniae NOT DETECTED NOT DETECTED Final  Culture, blood (x 2)     Status: None (Preliminary result)   Collection Time: 01/30/18 10:45 PM  Result Value Ref Range Status   Specimen Description   Final    BLOOD RIGHT HAND Performed at [facility], [place] [address] [place], [place] [address]    Special Requests   Final    BOTTLES DRAWN AEROBIC ONLY  Blood Culture results may not be optimal due to an inadequate volume of blood received in culture bottles Performed at [place] [address] [place], [place] [address]    Culture   Final    NO GROWTH 1 DAY Performed at [facility], [place] [address] [place], [place] [address]    Report Status PENDING  Incomplete  Culture, blood (x 2)     Status: None (Preliminary result)   Collection Time: 01/30/18 10:55 PM  Result Value Ref Range Status   Specimen Description   Final    BLOOD RIGHT WRIST Performed at [place] [address], [place], [place] [address]    Special Requests   Final    BOTTLES DRAWN AEROBIC ONLY Blood Culture results may not be optimal due to an inadequate volume of blood received in culture bottles Performed at [place] [address], [place], [place] [address]    Culture   Final  NO GROWTH 1 DAY Performed at [facility], [place] [address], [place], [place] [address]    Report Status PENDING  Incomplete  MRSA PCR Screening     Status: None   Collection Time: 01/30/18 11:12 PM  Result Value Ref Range Status   MRSA by PCR NEGATIVE NEGATIVE Final    Comment:        The GeneXpert MRSA Assay (FDA approved for NASAL specimens only), is one component of a comprehensive MRSA colonization surveillance program. It is not intended to diagnose MRSA infection nor to guide or monitor treatment for MRSA infections. Performed at [facility], [place] [address], [place], [place] [address]   C difficile quick scan w PCR reflex     Status: None   Collection Time: 01/30/18 11:12 PM  Result Value Ref Range Status   C Diff antigen NEGATIVE NEGATIVE Final   C Diff toxin NEGATIVE NEGATIVE Final   C Diff interpretation No C. difficile detected.  Final    Comment: Performed at [facility], [place] [address] [place], [place] [address]       Blood Culture     Component Value Date/Time   SDES  01/30/2018 2255    BLOOD RIGHT WRIST Performed at [place] [address] [place], [place] [address]    SPECREQUEST  01/30/2018 2255    BOTTLES DRAWN AEROBIC ONLY Blood Culture results may not be optimal due to an inadequate volume of blood received in culture bottles Performed at [facility], [place] [address] [place], [place] [address]    CULT  01/30/2018 2255    NO GROWTH 1 DAY Performed at [place] [address] [place], [place] [address]    REPTSTATUS PENDING 01/30/2018 2255      Labs: Results for orders placed or performed during the hospital encounter of 01/30/18 (from the past 48 hour(s))  Comprehensive metabolic panel     Status: Abnormal   Collection Time: 01/30/18 11:33 AM  Result Value Ref Range   Sodium 137 135 - 145 mmol/L   Potassium 3.2 (L) 3.5 - 5.1 mmol/L   Chloride 100 (L) 101 - 111 mmol/L   CO2 26 22 - 32 mmol/L   Glucose, Bld 107 (H) 65 - 99 mg/dL   BUN 11 6 - 20 mg/dL   Creatinine, Ser 1.47 (H) 0.44 - 1.00 mg/dL   Calcium 9.3 8.9 - 10.3 mg/dL   Total Protein 7.8 6.5 - 8.1 g/dL   Albumin 3.8 3.5 - 5.0 g/dL   AST 34 15 - 41 U/L   ALT 17 14 - 54 U/L   Alkaline Phosphatase 50 38 - 126 U/L   Total Bilirubin 0.8 0.3 - 1.2 mg/dL   GFR calc non Af Amer 32 (L) >60 mL/min   GFR calc Af Amer 37 (L) >60 mL/min    Comment: (NOTE) The eGFR has been calculated using the CKD EPI equation. This calculation has not been validated in all clinical situations. eGFR's persistently <60 mL/min signify possible Chronic Kidney Disease.    Anion gap 11 5 - 15    Comment: Performed at [facility], [place] [address] [place], [place] [address]  Lipase, blood     Status: Abnormal   Collection Time: 01/30/18 11:33 AM  Result Value Ref Range   Lipase 58 (H) 11 - 51 U/L    Comment: Performed at [facility], [place] [address] [place],  [place] [address]   CBC WITH DIFFERENTIAL     Status: Abnormal   Collection Time: 01/30/18 11:33 AM  Result Value Ref Range   WBC 10.1 4.0 - 10.5 K/uL   RBC 4.07 3.87 - 5.11 MIL/uL   Hemoglobin 12.5 12.0 - 15.0 g/dL   HCT 38.0 36.0 - 46.0 %   MCV 93.4 78.0 - 100.0 fL   MCH 30.7 26.0 - 34.0 pg   MCHC 32.9 30.0 - 36.0 g/dL   RDW 14.6 11.5 - 15.5 %   Platelets 239 150 - 400 K/uL   Neutrophils Relative % 80 %   Neutro Abs 8.1 (H) 1.7 - 7.7 K/uL   Lymphocytes Relative 9 %   Lymphs Abs 0.9 0.7 - 4.0 K/uL   Monocytes Relative 11 %   Monocytes Absolute 1.1 (H) 0.1 - 1.0 K/uL   Eosinophils Relative 0 %   Eosinophils Absolute 0.0 0.0 - 0.7 K/uL   Basophils Relative 0 %   Basophils Absolute 0.0 0.0 - 0.1 K/uL    Comment: Performed at [facility], [place] [address], [place], [place] [address]  Urinalysis, Routine w reflex microscopic     Status: Abnormal   Collection Time: 01/30/18  1:13 PM  Result Value Ref Range   Color, Urine YELLOW YELLOW   APPearance CLEAR CLEAR   Specific Gravity, Urine 1.009 1.005 - 1.030   pH 6.0 5.0 - 8.0   Glucose, UA NEGATIVE NEGATIVE mg/dL   Hgb urine dipstick NEGATIVE NEGATIVE   Bilirubin Urine NEGATIVE NEGATIVE   Ketones, ur NEGATIVE NEGATIVE mg/dL   Protein, ur 30 (A) NEGATIVE mg/dL   Nitrite NEGATIVE NEGATIVE   Leukocytes, UA NEGATIVE NEGATIVE   RBC / HPF 0-5 0 - 5 RBC/hpf   WBC, UA 0-5 0 - 5 WBC/hpf   Bacteria, UA RARE (A) NONE SEEN   Squamous Epithelial / LPF 6-30 (A) NONE SEEN   Mucus PRESENT     Comment: Performed at [facility], [place] [address] [place], [place] [address]  Respiratory Panel by PCR     Status: Abnormal   Collection Time: 01/30/18  7:28 PM  Result Value Ref Range   Adenovirus NOT DETECTED NOT DETECTED   Coronavirus 229E NOT DETECTED NOT DETECTED   Coronavirus HKU1 NOT DETECTED NOT DETECTED   Coronavirus NL63 NOT DETECTED NOT DETECTED   Coronavirus OC43 NOT DETECTED NOT DETECTED   Metapneumovirus NOT DETECTED  NOT DETECTED   Rhinovirus / Enterovirus NOT DETECTED NOT DETECTED   Influenza A H3 DETECTED (A) NOT DETECTED   Influenza B NOT DETECTED NOT DETECTED   Parainfluenza Virus 1 NOT DETECTED NOT DETECTED   Parainfluenza Virus 2 NOT DETECTED NOT DETECTED   Parainfluenza Virus 3 NOT DETECTED NOT DETECTED   Parainfluenza Virus 4 NOT DETECTED NOT DETECTED   Respiratory Syncytial Virus NOT DETECTED NOT DETECTED   Bordetella pertussis NOT DETECTED NOT DETECTED   Chlamydophila pneumoniae NOT DETECTED NOT DETECTED   Mycoplasma pneumoniae NOT DETECTED NOT DETECTED  Influenza panel by PCR (type A & B)     Status: Abnormal   Collection Time: 01/30/18  7:28 PM  Result Value Ref Range   Influenza A By PCR POSITIVE (A) NEGATIVE   Influenza B By PCR NEGATIVE NEGATIVE    Comment: (NOTE) The Xpert Xpress Flu assay is intended as an aid in the diagnosis of  influenza and should not be used as a sole basis for treatment.  This  assay is FDA approved for  nasopharyngeal swab specimens only. Nasal  washings and aspirates are unacceptable for Xpert Xpress Flu testing. Performed at [facility], [place] [address] [place], [place] [address]   Culture, blood (x 2)     Status: None (Preliminary result)   Collection Time: 01/30/18 10:45 PM  Result Value Ref Range   Specimen Description      BLOOD RIGHT HAND Performed at [place] [address], [place], [place] [address]    Special Requests      BOTTLES DRAWN AEROBIC ONLY Blood Culture results may not be optimal due to an inadequate volume of blood received in culture bottles Performed at [place] [address] [place], [place] [address]    Culture      NO GROWTH 1 DAY Performed at [place] [address] [place], [place] [address]    Report Status PENDING   Lactic acid, plasma     Status: Abnormal   Collection Time: 01/30/18 10:45 PM  Result Value Ref Range   Lactic Acid,  Venous 2.0 (HH) 0.5 - 1.9 mmol/L    Comment: CRITICAL RESULT CALLED TO, READ BACK BY AND VERIFIED WITHMellody Drown RN [id number] 01/30/18 A NAVARRO Performed at [facility], [place] [address] [place], [place] [address]   Culture, blood (x 2)     Status: None (Preliminary result)   Collection Time: 01/30/18 10:55 PM  Result Value Ref Range   Specimen Description      BLOOD RIGHT WRIST Performed at [place] [address], [place], [place] [address]    Special Requests      BOTTLES DRAWN AEROBIC ONLY Blood Culture results may not be optimal due to an inadequate volume of blood received in culture bottles Performed at [place] [address] [place], [place] [address]    Culture      NO GROWTH 1 DAY Performed at [place] [address] [place], [place] [address]    Report Status PENDING   MRSA PCR Screening     Status: None   Collection Time: 01/30/18 11:12 PM  Result Value Ref Range   MRSA by PCR NEGATIVE NEGATIVE    Comment:        The GeneXpert MRSA Assay (FDA approved for NASAL specimens only), is one component of a comprehensive MRSA colonization surveillance program. It is not intended to diagnose MRSA infection nor to guide or monitor treatment for MRSA infections. Performed at [facility], [place] [address], [place], [place] [address]   C difficile quick scan w PCR reflex     Status: None   Collection Time: 01/30/18 11:12 PM  Result Value Ref Range   C Diff antigen NEGATIVE NEGATIVE   C Diff toxin NEGATIVE NEGATIVE   C Diff interpretation No C. difficile detected.     Comment: Performed at [facility], [place] [address] [place] [address], [place], [place] [address]  Magnesium     Status: None   Collection Time: 01/31/18  2:07 AM  Result Value Ref Range   Magnesium 1.9 1.7 - 2.4 mg/dL    Comment: Performed at [facility], [place] [address], [place], [place]  [address]  Phosphorus     Status: None   Collection Time: 01/31/18  2:07 AM  Result Value Ref Range   Phosphorus 2.5 2.5 - 4.6 mg/dL    Comment: Performed at [facility], [address] [place], [place] [address]  Hepatic function panel     Status: Abnormal   Collection Time: 01/31/18  2:07 AM  Result Value Ref Range   Total Protein 8.1 6.5 - 8.1 g/dL   Albumin 3.7 3.5 - 5.0 g/dL   AST 47 (H) 15 - 41 U/L   ALT 18 14 - 54 U/L   Alkaline Phosphatase 48 38 - 126 U/L   Total Bilirubin 0.9 0.3 - 1.2 mg/dL   Bilirubin, Direct 0.1 0.1 - 0.5 mg/dL   Indirect Bilirubin 0.8 0.3 - 0.9 mg/dL    Comment: Performed at [facility], [place] [address] [place], [place] [address]  Lactic acid, plasma     Status: None   Collection Time: 01/31/18  2:07 AM  Result Value Ref Range   Lactic Acid, Venous 1.5 0.5 - 1.9 mmol/L    Comment: Performed at [facility], [place] [address] [place], [place] [address]  Procalcitonin     Status: None   Collection Time: 01/31/18  2:07 AM  Result Value Ref Range   Procalcitonin 0.20 ng/mL    Comment:        Interpretation: PCT (Procalcitonin) <= 0.5 ng/mL: Systemic infection (sepsis) is not likely. Local bacterial infection is possible. (NOTE)       Sepsis PCT Algorithm           Lower Respiratory Tract                                      Infection PCT Algorithm    ----------------------------     ----------------------------         PCT < 0.25 ng/mL                PCT < 0.10 ng/mL         Strongly encourage             Strongly discourage   discontinuation of antibiotics    initiation of antibiotics    ----------------------------     -----------------------------       PCT 0.25 - 0.50 ng/mL            PCT 0.10 - 0.25 ng/mL               OR       >80% decrease in PCT            Discourage initiation of                                            antibiotics      Encourage discontinuation           of  antibiotics    ----------------------------     -----------------------------         PCT >= 0.50 ng/mL              PCT 0.26 - 0.50 ng/mL               AND        <80% decrease in PCT             Encourage initiation of  antibiotics       Encourage continuation           of antibiotics    ----------------------------     -----------------------------        PCT >= 0.50 ng/mL                  PCT > 0.50 ng/mL               AND         increase in PCT                  Strongly encourage                                      initiation of antibiotics    Strongly encourage escalation           of antibiotics                                     -----------------------------                                           PCT <= 0.25 ng/mL                                                 OR                                        > 80% decrease in PCT                                     Discontinue / Do not initiate                                             antibiotics Performed at [place] [address], [place], [place] [address]   Magnesium     Status: None   Collection Time: 01/31/18  6:01 AM  Result Value Ref Range   Magnesium 1.8 1.7 - 2.4 mg/dL    Comment: Performed at [facility], [place] [address] [place], [place] [address]  Phosphorus     Status: None   Collection Time: 01/31/18  6:01 AM  Result Value Ref Range   Phosphorus 2.5 2.5 - 4.6 mg/dL    Comment: Performed at [facility], [place] [address] [place], [place] [address]  TSH     Status: None   Collection Time: 01/31/18  6:01 AM  Result Value Ref Range   TSH 2.642 0.350 - 4.500 uIU/mL    Comment: Performed by a 3rd Generation assay with a functional sensitivity of <=0.01 uIU/mL. Performed at [facility], [place] [address] [place], [place] [address]   Comprehensive metabolic panel  Status: Abnormal    Collection Time: 01/31/18  6:01 AM  Result Value Ref Range   Sodium 136 135 - 145 mmol/L   Potassium 3.6 3.5 - 5.1 mmol/L   Chloride 105 101 - 111 mmol/L   CO2 23 22 - 32 mmol/L   Glucose, Bld 94 65 - 99 mg/dL   BUN 8 6 - 20 mg/dL   Creatinine, Ser 1.17 (H) 0.44 - 1.00 mg/dL   Calcium 8.5 (L) 8.9 - 10.3 mg/dL   Total Protein 7.0 6.5 - 8.1 g/dL   Albumin 3.2 (L) 3.5 - 5.0 g/dL   AST 38 15 - 41 U/L   ALT 17 14 - 54 U/L   Alkaline Phosphatase 41 38 - 126 U/L   Total Bilirubin 0.6 0.3 - 1.2 mg/dL   GFR calc non Af Amer 42 (L) >60 mL/min   GFR calc Af Amer 49 (L) >60 mL/min    Comment: (NOTE) The eGFR has been calculated using the CKD EPI equation. This calculation has not been validated in all clinical situations. eGFR's persistently <60 mL/min signify possible Chronic Kidney Disease.    Anion gap 8 5 - 15    Comment: Performed at [facility], [place] [address], [place], [place] [address]  CBC     Status: Abnormal   Collection Time: 01/31/18  6:01 AM  Result Value Ref Range   WBC 7.0 4.0 - 10.5 K/uL   RBC 3.77 (L) 3.87 - 5.11 MIL/uL   Hemoglobin 11.6 (L) 12.0 - 15.0 g/dL   HCT 34.7 (L) 36.0 - 46.0 %   MCV 92.0 78.0 - 100.0 fL   MCH 30.8 26.0 - 34.0 pg   MCHC 33.4 30.0 - 36.0 g/dL   RDW 14.6 11.5 - 15.5 %   Platelets 221 150 - 400 K/uL    Comment: Performed at [facility], [place] [address] [place], [place] [address]  Basic metabolic panel     Status: Abnormal   Collection Time: 02/01/18  5:19 AM  Result Value Ref Range   Sodium 139 135 - 145 mmol/L   Potassium 3.8 3.5 - 5.1 mmol/L   Chloride 108 101 - 111 mmol/L   CO2 23 22 - 32 mmol/L   Glucose, Bld 84 65 - 99 mg/dL   BUN 9 6 - 20 mg/dL   Creatinine, Ser 1.21 (H) 0.44 - 1.00 mg/dL   Calcium 8.7 (L) 8.9 - 10.3 mg/dL   GFR calc non Af Amer 41 (L) >60 mL/min   GFR calc Af Amer 47 (L) >60 mL/min    Comment: (NOTE) The eGFR has been calculated using the CKD EPI equation. This  calculation has not been validated in all clinical situations. eGFR's persistently <60 mL/min signify possible Chronic Kidney Disease.    Anion gap 8 5 - 15    Comment: Performed at [facility], [place] [address] [place], [place] [address]     Lipid Panel  No results found for: CHOL, TRIG, HDL, CHOLHDL, VLDL, LDLCALC, LDLDIRECT   No results found for: HGBA1C   Lab Results  Component Value Date   CREATININE 1.21 (H) 02/01/2018     HPI   84 y.o.femalewith medical history significant of CAD,partial colectomy for invasive polyp and take back for postop infection remotely, HTN, hypothyroidism, HLD, and COPD who presents with cough and abdominal pain secondary to influenza A. Initial chest x-ray was negative for any acute cardiopulmonary abnormality. Patient also had a CT abdomen pelvis ,  but did not show any acute process, scattered sigmoid diverticulosis without diverticulitis.     HOSPITAL COURSE:   . SIRS (systemic inflammatory response syndrome) ([place]), without any evidence of sepsis.febrile to 101.4. No growth on blood cultures thus far.  discontinued empiric antibiotics  which was started at the time of admission. Symptoms likely secondary to influenza A. Initiated onTamiflu, which she will continue for another 5 days. She was hydrated aggressively with IV fluids. Please continue to follow blood cultures did remain negative so far. Patient is being discharged with home health  . Hypokalemia/dehydration-  Initially hypokalemic, repleted completed IV fluids. Magnesium 1.8 this admission.  Marland Kitchen HTN (hypertension)continue home medications given accelerated hypertension  . Hypothyroidism-restart Synthroid , TSH 2.64  . Generalized abdominal tenderness-likely related to viral illness CT of abdomen unremarkable  . CAD (coronary artery disease)-- chronic, continue aspirin and statin   . AKI (acute kidney injury) ([place])-- likely secondary to  dehydration, creatinine has improved from 1.47> 1.17,     History of COPD currently no evidence of wheezing, continued as needed nebulizers as needed scheduled DuoNeb        Discharge Exam:  * Blood pressure (!) 151/52, pulse 77, temperature 99.7 F (37.6 C), temperature source Oral, resp. rate 18, height '4\' 9"'$  (1.448 m), weight 58.6 kg (129 lb 1.6 oz), SpO2 94 %.  Cardiovascular system: S1 & S2 heard, RRR. No JVD, murmurs, rubs, gallops or clicks. No pedal edema. Gastrointestinal system: Abdomen is nondistended, soft and nontender. No organomegaly or masses felt. Normal bowel sounds heard. Central nervous system: Alert and oriented. No focal neurological deficits. Extremities: Symmetric 5 x 5 power. Skin: No rashes, lesions or ulcers Psychiatry: Judgement and insight appear normal. Mood & affect appropriate.      Follow-up Information    [facility] Follow up.   Specialty:  [place] Why:  [facility] physical therapy Contact information: [place] [address] [phone]           Signed: Reyne Dumas 02/01/2018, 11:28 AM        Time spent >1 hour

## 2018-02-03 ENCOUNTER — Encounter: Payer: Self-pay | Admitting: Vascular Surgery

## 2018-02-03 ENCOUNTER — Ambulatory Visit (INDEPENDENT_AMBULATORY_CARE_PROVIDER_SITE_OTHER): Payer: Medicare HMO | Admitting: Vascular Surgery

## 2018-02-03 ENCOUNTER — Other Ambulatory Visit: Payer: Self-pay

## 2018-02-03 VITALS — BP 137/69 | HR 98 | Temp 97.2°F | Resp 20 | Ht <= 58 in | Wt 129.0 lb

## 2018-02-03 DIAGNOSIS — I739 Peripheral vascular disease, unspecified: Secondary | ICD-10-CM | POA: Diagnosis not present

## 2018-02-03 NOTE — Progress Notes (Signed)
Patient ID: Katie MorinMary Agnes [name], female   DOB: 06/05/1933, 84 y.o.   MRN: [id number]  Reason for Consult: New Patient (Initial Visit) (3-6 wk f/u to discuss angiogram. )   Referred by Macy MisBriscoe, Kim K, MD  Subjective:     HPI:  Katie MorinMary Agnes [name] is a 84 y.o. 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  She states that she has had some weight loss and has difficulty swallowing but does not have any food fear or pain with eating.  Her chief complaint regarding her abdomen is constipation.  She also has difficulty with her medications at this time.  She has no lower extremity complaint specifically no claudication or wounds.  Past Medical History:  Diagnosis Date  . CHF (congestive heart failure) (HCC)   . Coronary artery disease   . Hypertension   . Hypothyroidism    Family History  Problem Relation Age of Onset  . Hypertension Brother    Past Surgical History:  Procedure Laterality Date  . cadiac stents    . CARDIAC CATHETERIZATION    . COLON SURGERY    . CORONARY ARTERY BYPASS GRAFT  2009    Short Social History:  Social History   Tobacco Use  . Smoking status: Former Games developermoker  . Smokeless tobacco: Never Used  . Tobacco comment: quit smoking over 30 yrs ago   Substance Use Topics  . Alcohol use: No    Frequency: Never    Allergies  Allergen Reactions  . Clams [Shellfish Allergy] Anaphylaxis  . Hydralazine Other (See Comments)    Possible cause of chest discomfort  . Red Dye Other (See Comments)    Constipation   . Sesame Seed [Sesame Oil] Swelling and Other (See Comments)    Wheezing     Current Outpatient Medications  Medication Sig Dispense Refill  . amLODipine (NORVASC) 5 MG tablet Take 1 tablet (5 mg total) by mouth daily. 30 tablet 2  . aspirin 81 MG chewable tablet Chew 81 mg by mouth daily.    . Cholecalciferol (VITAMIN D-3) 1000 units CAPS Take 1,000 Units by mouth daily.    Marland Kitchen.  diltiazem (CARDIZEM CD) 240 MG 24 hr capsule Take 240 mg by mouth daily.  1  . ENSURE PLUS (ENSURE PLUS) LIQD Take 237 mLs by mouth daily.    Marland Kitchen. guaiFENesin (MUCINEX) 600 MG 12 hr tablet Take 1 tablet (600 mg total) by mouth 2 (two) times daily for 7 days. 14 tablet 0  . isosorbide-hydrALAZINE (BIDIL) 20-37.5 MG tablet Take 1 tablet by mouth 3 (three) times daily. 90 tablet 2  . levothyroxine (SYNTHROID, LEVOTHROID) 50 MCG tablet Take 50 mcg by mouth daily.  3  . montelukast (SINGULAIR) 5 MG chewable tablet Chew 5 mg by mouth at bedtime.    . Multiple Vitamin (MULTIVITAMIN WITH MINERALS) TABS tablet Take 1 tablet by mouth daily.    Marland Kitchen. oseltamivir (TAMIFLU) 30 MG capsule Take 1 capsule (30 mg total) by mouth 2 (two) times daily for 4 days. 8 capsule 0  . pravastatin (PRAVACHOL) 40 MG tablet Take 40 mg by mouth daily.  3  . vitamin B-12 (CYANOCOBALAMIN) 1000 MCG tablet Take 1,000 mcg by mouth daily.    Melene Muller. [START ON 02/06/2018] cloNIDine (CATAPRES) 0.1 MG tablet Take 1 tablet (0.1 mg total) by mouth 2 (two) times daily. (Patient not taking: Reported on 02/03/2018) 60 tablet 11   No current facility-administered medications for  this visit.     Review of Systems  Constitutional: Positive for unexpected weight change.  HENT: Positive for trouble swallowing.  Eyes: Eyes negative.  Cardiovascular: Cardiovascular negative.  GI: Positive for abdominal pain.       constipation GU: Genitourinary negative. Musculoskeletal: Musculoskeletal negative.  Skin: Skin negative.  Neurological: Neurological negative. Hematologic: Hematologic/lymphatic negative.  Psychiatric: Psychiatric negative.        Objective:  Objective   Vitals:   02/03/18 1021  BP: 137/69  Pulse: 98  Resp: 20  Temp: (!) 97.2 F (36.2 C)  TempSrc: Oral  SpO2: 97%  Weight: 129 lb (58.5 kg)  Height: 4\' 9"  (1.448 m)   Body mass index is 27.92 kg/m.  Physical Exam  Constitutional: She appears well-developed.  HENT:  Head:  Normocephalic.  Eyes: Pupils are equal, round, and reactive to light.  Cardiovascular:  Pulses:      Carotid pulses are 2+ on the right side, and 2+ on the left side.      Radial pulses are 2+ on the right side, and 2+ on the left side.       Femoral pulses are 2+ on the right side, and 2+ on the left side. Monophasic dp/pt bilaterally    Data: She has no new studies today.  She previously had ABIs of 0.56 and 0.39.    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     Assessment/Plan:     84 year old female presents after multiple hospitalizations with abdominal pain and recently had influenza A.  She has severely depressed ABIs bilaterally.  She is free of lower extremity symptoms specifically having no wounds or claudication and from that standpoint we will continue to follow her with ABIs in 6 months.  At that time I will also get a mesenteric duplex given her ongoing abdominal symptoms although she does not appear to have chronic mesenteric ischemia at this time.     Maeola Harman MD [facility]

## 2018-02-05 LAB — CULTURE, BLOOD (ROUTINE X 2)
CULTURE: NO GROWTH
Culture: NO GROWTH

## 2018-02-09 ENCOUNTER — Other Ambulatory Visit: Payer: Self-pay

## 2018-02-09 DIAGNOSIS — I739 Peripheral vascular disease, unspecified: Secondary | ICD-10-CM

## 2018-02-09 DIAGNOSIS — R1084 Generalized abdominal pain: Secondary | ICD-10-CM

## 2018-08-11 ENCOUNTER — Ambulatory Visit (INDEPENDENT_AMBULATORY_CARE_PROVIDER_SITE_OTHER)
Admission: RE | Admit: 2018-08-11 | Discharge: 2018-08-11 | Disposition: A | Discharge status: Home / Self Care | Payer: Medicare HMO | Source: Ambulatory Visit | Attending: Family | Admitting: Family

## 2018-08-11 ENCOUNTER — Other Ambulatory Visit: Payer: Self-pay

## 2018-08-11 ENCOUNTER — Ambulatory Visit (INDEPENDENT_AMBULATORY_CARE_PROVIDER_SITE_OTHER): Payer: Medicare HMO | Admitting: Family

## 2018-08-11 ENCOUNTER — Encounter: Payer: Self-pay | Admitting: Family

## 2018-08-11 ENCOUNTER — Ambulatory Visit (HOSPITAL_COMMUNITY)
Admission: RE | Admit: 2018-08-11 | Discharge: 2018-08-11 | Disposition: A | Discharge status: Home / Self Care | Payer: Medicare HMO | Source: Ambulatory Visit | Attending: Family | Admitting: Family

## 2018-08-11 VITALS — BP 191/73 | HR 68 | Temp 97.1°F | Resp 18 | Ht <= 58 in | Wt 130.0 lb

## 2018-08-11 DIAGNOSIS — I739 Peripheral vascular disease, unspecified: Secondary | ICD-10-CM

## 2018-08-11 DIAGNOSIS — Z87891 Personal history of nicotine dependence: Secondary | ICD-10-CM | POA: Insufficient documentation

## 2018-08-11 DIAGNOSIS — R1084 Generalized abdominal pain: Secondary | ICD-10-CM | POA: Diagnosis not present

## 2018-08-11 DIAGNOSIS — K551 Chronic vascular disorders of intestine: Secondary | ICD-10-CM

## 2018-08-11 DIAGNOSIS — I10 Essential (primary) hypertension: Secondary | ICD-10-CM | POA: Insufficient documentation

## 2018-08-11 DIAGNOSIS — I2510 Atherosclerotic heart disease of native coronary artery without angina pectoris: Secondary | ICD-10-CM | POA: Insufficient documentation

## 2018-08-11 NOTE — Progress Notes (Signed)
CC: Follow up Mesenteric Ischemia  History of Present Illness  Katie [name] is a 84 y.o. 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   She states that she has had some weight loss and had difficulty swallowing but does not have any food fear or pain with eating.   At previous visits her primary complaint regarding her abdomen was constipation.  She also has had difficulty with her medications.  Dr. Randie Heinz last evaluated pt on 02-03-18. 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 She had multiple hospitalizations with abdominal pain and more recently had influenza A.  She had severely depressed ABIs bilaterally. But was free of lower extremity symptoms specifically having no wounds or claudication and from that standpoint Dr. Randie Heinz advised to continue to follow her with ABIs in 6 months.  At that time will also get a mesenteric duplex given her ongoing abdominal symptoms although she did not appear to have chronic mesenteric ischemia at this time.  She currently denies post prandial abdominal pain. She states her appetite is alright.  She weighed 129 pounds on 02-03-18, weighs 130 pounds today; her weight is stable.  She denies any more abdominal pain.  She denies constipation, but seems to have tenesmus.  Pt states she had a partial colectomy to remove some polyps, had complications from this.   She takes a daily 81 mg ASA and a statin.   She denies chest pain, denies dyspnea, denies headache; she is hypertensive now.   She denies pain or weakness in the muscles of her legs when she walks, denies non healing wounds in her lower extremities.    Diabetic: no Tobacco use: former smoker, quit about age 47, started age 19  Past Medical History:  Diagnosis  Date  . CHF (congestive heart failure) (HCC)   . Coronary artery disease   . Hypertension   . Hypothyroidism     Social History Social History   Tobacco Use  . Smoking status: Former Games developer  . Smokeless tobacco: Never Used  . Tobacco comment: quit smoking over 30 yrs ago   Substance Use Topics  . Alcohol use: No    Frequency: Never  . Drug use: No    Family History Family History  Problem Relation Age of Onset  . Hypertension Brother     Surgical History Past Surgical History:  Procedure Laterality Date  . cadiac stents    . CARDIAC CATHETERIZATION    . COLON SURGERY    . CORONARY ARTERY BYPASS GRAFT  2009    Allergies  Allergen Reactions  . Clams [Shellfish Allergy] Anaphylaxis  . Hydralazine Other (See Comments)    Possible cause of chest discomfort  . Red Dye Other (See Comments)    Constipation   . Sesame Seed [Sesame Oil] Swelling and Other (See Comments)    Wheezing     Current Outpatient Medications  Medication Sig Dispense Refill  . amLODipine (NORVASC) 5 MG tablet Take 1 tablet (5 mg total) by mouth daily. 30 tablet 2  . aspirin 81 MG chewable tablet Chew 81 mg by mouth daily.    . Cholecalciferol (VITAMIN D-3) 1000 units CAPS Take 1,000 Units by mouth daily.    . cloNIDine (CATAPRES) 0.1 MG tablet Take 1 tablet (0.1 mg total) by  mouth 2 (two) times daily. 60 tablet 11  . diltiazem (CARDIZEM CD) 240 MG 24 hr capsule Take 240 mg by mouth daily.  1  . ENSURE PLUS (ENSURE PLUS) LIQD Take 237 mLs by mouth daily.    . isosorbide-hydrALAZINE (BIDIL) 20-37.5 MG tablet Take 1 tablet by mouth 3 (three) times daily. 90 tablet 2  . levothyroxine (SYNTHROID, LEVOTHROID) 50 MCG tablet Take 50 mcg by mouth daily.  3  . montelukast (SINGULAIR) 5 MG chewable tablet Chew 5 mg by mouth at bedtime.    . Multiple Vitamin (MULTIVITAMIN WITH MINERALS) TABS tablet Take 1 tablet by mouth daily.    . pravastatin (PRAVACHOL) 40 MG tablet Take 40 mg by mouth daily.  3  .  vitamin B-12 (CYANOCOBALAMIN) 1000 MCG tablet Take 1,000 mcg by mouth daily.     No current facility-administered medications for this visit.     ROS: see HPI for pertinent positives and negatives    Physical Examination  Vitals:   08/11/18 1000 08/11/18 1003  BP: (!) 195/71 (!) 191/73  Pulse: 68   Resp: 18   Temp: (!) 97.1 F (36.2 C)   TempSrc: Oral   SpO2: 96%   Weight: 130 lb (59 kg)   Height: 4\' 9"  (1.448 m)    Body mass index is 28.13 kg/m.  General: A&O x 3, WDWN, petite female Gait: normal HEENT: no gross abnormalities  Pulmonary: Sym exp, good air movt, CTAB, no rales, rhonchi, or wheezing. Cardiac: RRR, Nl S1, S2, no detected Murmur.  Vascular: Vessel Right Left  Radial 2+Palpable 2+Palpable  Carotid  without bruit  without bruit  Aorta Not palpable N/A  Femoral 1+Palpable 1+Palpable  Popliteal Not palpable Not palpable  PT Not Palpable Not Palpable  DP Not Palpable Not Palpable   Gastrointestinal: soft, NTND, -G/R, - HSM, - masses, - CVAT. Musculoskeletal: M/S 5/5 throughout, Extremities without ischemic changes. Neurologic: Pain and light touch intact in extremities, Motor exam as listed above. CN 2-12 intact.  Skin: No rash, no cellulitis, no ulcers noted.  Psychiatric: Normal thought content, mood appropriate to clinical situation.     DATA  Mesenteric Duplex (Date: 08/11/2018):   Ao: no stenosis  Celiac artery: 367 cm/s  SMA: 331 cm//s proximal  IMA: not identified  ABI (Date: 08-11-18):  R:   ABI: 0.65 (was 0.56 on 12-19-17),   PT: mono  DP: mono  TBI:  0.32, toe pressure 64, (was 0.30)  L:   ABI: 0.46 (was 0.56),   PT: mono  DP: mono  TBI: 0.29, toe pressure 57, (was 0.26) Slight improvement in right ABI to moderate disease. Slight decline in left ABI to severe disease. All monophasic waveforms. Stable bilateral TBI.    Medical Decision Making  Katie [name] is a 84 y.o. female who presents with: asymptomatic mild  chronic mesenteric ischemia and asymptomatic moderate/severe peripheral artery occlusive disease.     - Asyptomatic mild mesenteric ischemia: I discussed with Dr. Randie Heinz, no need to recheck mesenteric duplex unless pt develops post prandial abdominal pain.    - Asymptomatic moderate/severe PAD: walk at least 30 minutes daily in a safe environment. Notify us if she develops concerns re the circulation in her feet or legs.    - Asymptomatic uncontrolled htn: I advised pt to call her PCP's office today ASAP to seek advice how to address.    Based on her exam and studies, I have offered the patient return in 1 year with ABI's. Marland Kitchen  I discussed in depth with the patient the nature of atherosclerosis, and emphasized the importance of maximal medical management including strict control of blood pressure, blood glucose, and lipid levels, obtaining regular exercise, and continued cessation of smoking.    The patient is aware that without maximal medical management the underlying atherosclerotic disease process will progress, limiting the benefit of any interventions. . The patient is currently on a statin: . The patient is currently on an anti-platelet: ASA.    Thank you for allowing [place] to participate in this patient's care.  Charisse March, RN, MSN, FNP-C [facility] [place] Office: [phone]  Clinic MD: Randie Heinz  08/11/2018, 10:20 AM

## 2018-08-11 NOTE — Patient Instructions (Addendum)
Peripheral Vascular Disease Peripheral vascular disease (PVD) is a disease of the blood vessels that are not part of your heart and brain. A simple term for PVD is poor circulation. In most cases, PVD narrows the blood vessels that carry blood from your heart to the rest of your body. This can result in a decreased supply of blood to your arms, legs, and internal organs, like your stomach or kidneys. However, it most often affects a person's lower legs and feet. There are two types of PVD.  Organic PVD. This is the more common type. It is caused by damage to the structure of blood vessels.  Functional PVD. This is caused by conditions that make blood vessels contract and tighten (spasm).  Without treatment, PVD tends to get worse over time. PVD can also lead to acute ischemic limb. This is when an arm or limb suddenly has trouble getting enough blood. This is a medical emergency. Follow these instructions at home:  Take medicines only as told by your doctor.  Do not use any tobacco products, including cigarettes, chewing tobacco, or electronic cigarettes. If you need help quitting, ask your doctor.  Lose weight if you are overweight, and maintain a healthy weight as told by your doctor.  Eat a diet that is low in fat and cholesterol. If you need help, ask your doctor.  Exercise regularly. Ask your doctor for some good activities for you.  Take good care of your feet. ? Wear comfortable shoes that fit well. ? Check your feet often for any cuts or sores. Contact a doctor if:  You have cramps in your legs while walking.  You have leg pain when you are at rest.  You have coldness in a leg or foot.  Your skin changes.  You are unable to get or have an erection (erectile dysfunction).  You have cuts or sores on your feet that are not healing. Get help right away if:  Your arm or leg turns cold and blue.  Your arms or legs become red, warm, swollen, painful, or numb.  You have  chest pain or trouble breathing.  You suddenly have weakness in your face, arm, or leg.  You become very confused or you cannot speak.  You suddenly have a very bad headache.  You suddenly cannot see. This information is not intended to replace advice given to you by your health care provider. Make sure you discuss any questions you have with your health care provider. Document Released: 01/19/2010 Document Revised: 04/01/2016 Document Reviewed: 04/04/2014 Elsevier Interactive Patient Education  2017 Else[employer] Inc.     Chronic Mesenteric Ischemia Mesenteric ischemia is poor blood flow (circulation) in the vessels that supply blood to the stomach, intestines, and liver (mesenteric organs). Chronic mesenteric ischemia, also called mesenteric angina or intestinal angina, is a long-term (chronic) condition. It happens when an artery or vein that provides blood to the mesenteric organs gradually becomes blocked or narrow, restricting the blood supply to the organs. When the blood supply is severely restricted, the mesenteric organs cannot work properly. What are the causes? This condition is commonly caused by fatty deposits that build up in an artery (plaque), which can narrow the artery and restrict blood flow. Other causes include:  Weakened areas in blood vessel walls (aneurysms).  Conditions that cause twisting or inflammation of blood vessels, such as fibromuscular dysplasia or arteritis.  A disorder in which blood clots form in the veins (venous thrombosis).  Scarring and thickening (fibrosis) of blood   vessels caused by radiation therapy.  A tear in the aorta, the body's main artery (aortic dissection).  Blood vessel problems after illegal drug use, such as use of cocaine.  Tumors in the nervous system (neurofibromatosis).  Certain autoimmune diseases, such as lupus.  What increases the risk? The following factors may make you more likely to develop this condition:  Being  female.  Being over age 50, especially if you have a history of heart problems.  Smoking.  Congestive heart failure.  Irregular heartbeat (arrhythmia).  Having a history of heart attack or stroke.  Diabetes.  High cholesterol.  High blood pressure (hypertension).  Being overweight or obese.  Kidney disease (renal disease) requiring dialysis.  What are the signs or symptoms? Symptoms of this condition include:  Abdomen (abdominal) pain or cramps that develop 15-60 minutes after a meal. This pain may last for 1-3 hours. Some people may develop a fear of eating because of this symptom.  Weight loss.  Diarrhea.  Bloody stool.  Nausea.  Vomiting.  Bloating.  Abdominal pain after stress or with exercise.  How is this diagnosed? This condition is diagnosed based on:  Your medical history.  A physical exam.  Tests, such as: ? Ultrasound. ? CT scan. ? Blood tests. ? Urine tests. ? An imaging test that involves injecting a dye into your arteries to show blood flow through blood vessels (angiogram). This can help to show if there are any blockages in the vessels that lead to the intestines. ? Passing a small probe through the mouth and into the stomach to measure the output of carbon dioxide (gastric tonometry). This can help to indicate whether there is decreased blood flow to the stomach and intestines.  How is this treated? This condition may be treated with:  Dietary changes such as eating smaller, low-fat, meals more frequently.  Lifestyle changes to treat underlying conditions that contribute to the disease, such as high cholesterol and high blood pressure.  Medicines to reduce blood clotting and increase blood flow.  Surgery to remove the blockage, repair arteries or veins, and restore blood flow. This may involve: ? Angioplasty. This is surgery to widen the affected artery, reduce the blockage, and sometimes insert a small, mesh tube (stent). ? Bypass  surgery. This may be done to go around (bypass) the blockage and reconnect healthy arteries or veins. ? Placing a stent in the affected area. This may be done to help keep blocked arteries open.  Follow these instructions at home: Eating and drinking  Eat a heart-healthy diet. This includes fresh fruits and vegetables, whole grains, and lean proteins like chicken, fish, eggs, and beans.  Avoid foods that contain a lot of: ? Salt (sodium). ? Sugar. ? Saturated fat (such as red meat). ? Trans fat (such as fried foods).  Stay hydrated. Drink enough fluid to keep your urine clear or pale yellow. Lifestyle  Stay active and get regular exercise as told by your health care provider. Aim for 150 minutes of moderate activity or 75 minutes of vigorous activity a week. Ask your health care provider what activities and forms of exercise are safe for you.  Maintain a healthy weight.  Work with your health care provider to manage your cholesterol.  Manage any other health problems you have, such as high blood pressure, diabetes, or heart rhythm problems.  Do not use any products that contain nicotine or tobacco, such as cigarettes and e-cigarettes. If you need help quitting, ask your health care provider.   General instructions  Take over-the-counter and prescription medicines only as told by your health care provider.  Keep all follow-up visits as told by your health care provider. This is important. Contact a health care provider if:  Your symptoms do not improve or they return after treatment.  You have a fever. Get help right away if:  You have severe abdominal pain.  You have severe chest pain.  You have shortness of breath.  You feel weak or dizzy.  You have palpitations.  You have numbness or weakness in your face, arm, or leg.  You are confused.  You have trouble speaking or people have trouble understanding what you are saying.  You are constipated.  You have trouble  urinating.  You have blood in your stool.  You have severe nausea, vomiting, or persistent diarrhea. Summary  Mesenteric ischemia is poor circulation in the vessels that supply blood to the the stomach, intestines, and liver (mesenteric organs).  This condition happens when an artery or vein that provides blood to the mesenteric organs gradually becomes blocked or narrow, restricting the blood supply to the organs.  This condition is commonly caused by fatty deposits that build up in an artery (plaque), which can narrow the artery and restrict blood flow.  You are more likely to develop this condition if you are over age 50 and have a history of heart problems, high blood pressure, diabetes, or high cholesterol.  This condition is usually treated with medicines, dietary and lifestyle changes, and surgery to remove the blockage, repair arteries or veins, and restore blood flow. This information is not intended to replace advice given to you by your health care provider. Make sure you discuss any questions you have with your health care provider. Document Released: 06/14/2011 Document Revised: 10/09/2016 Document Reviewed: 10/09/2016 Elsevier Interactive Patient Education  2017 Elsevier Inc.  

## 2018-08-12 ENCOUNTER — Encounter: Payer: Self-pay | Admitting: Family

## 2018-08-14 DIAGNOSIS — N183 Chronic kidney disease, stage 3 unspecified: Secondary | ICD-10-CM | POA: Insufficient documentation

## 2018-08-23 IMAGING — CR DG ABDOMEN 1V
1 series · 1 of 1 positions shown · non-contrast
Comparison: Radiograph 12/25/2017

CLINICAL DATA: Constipation.

EXAM:
ABDOMEN - 1 VIEW

[x abdomen supine]
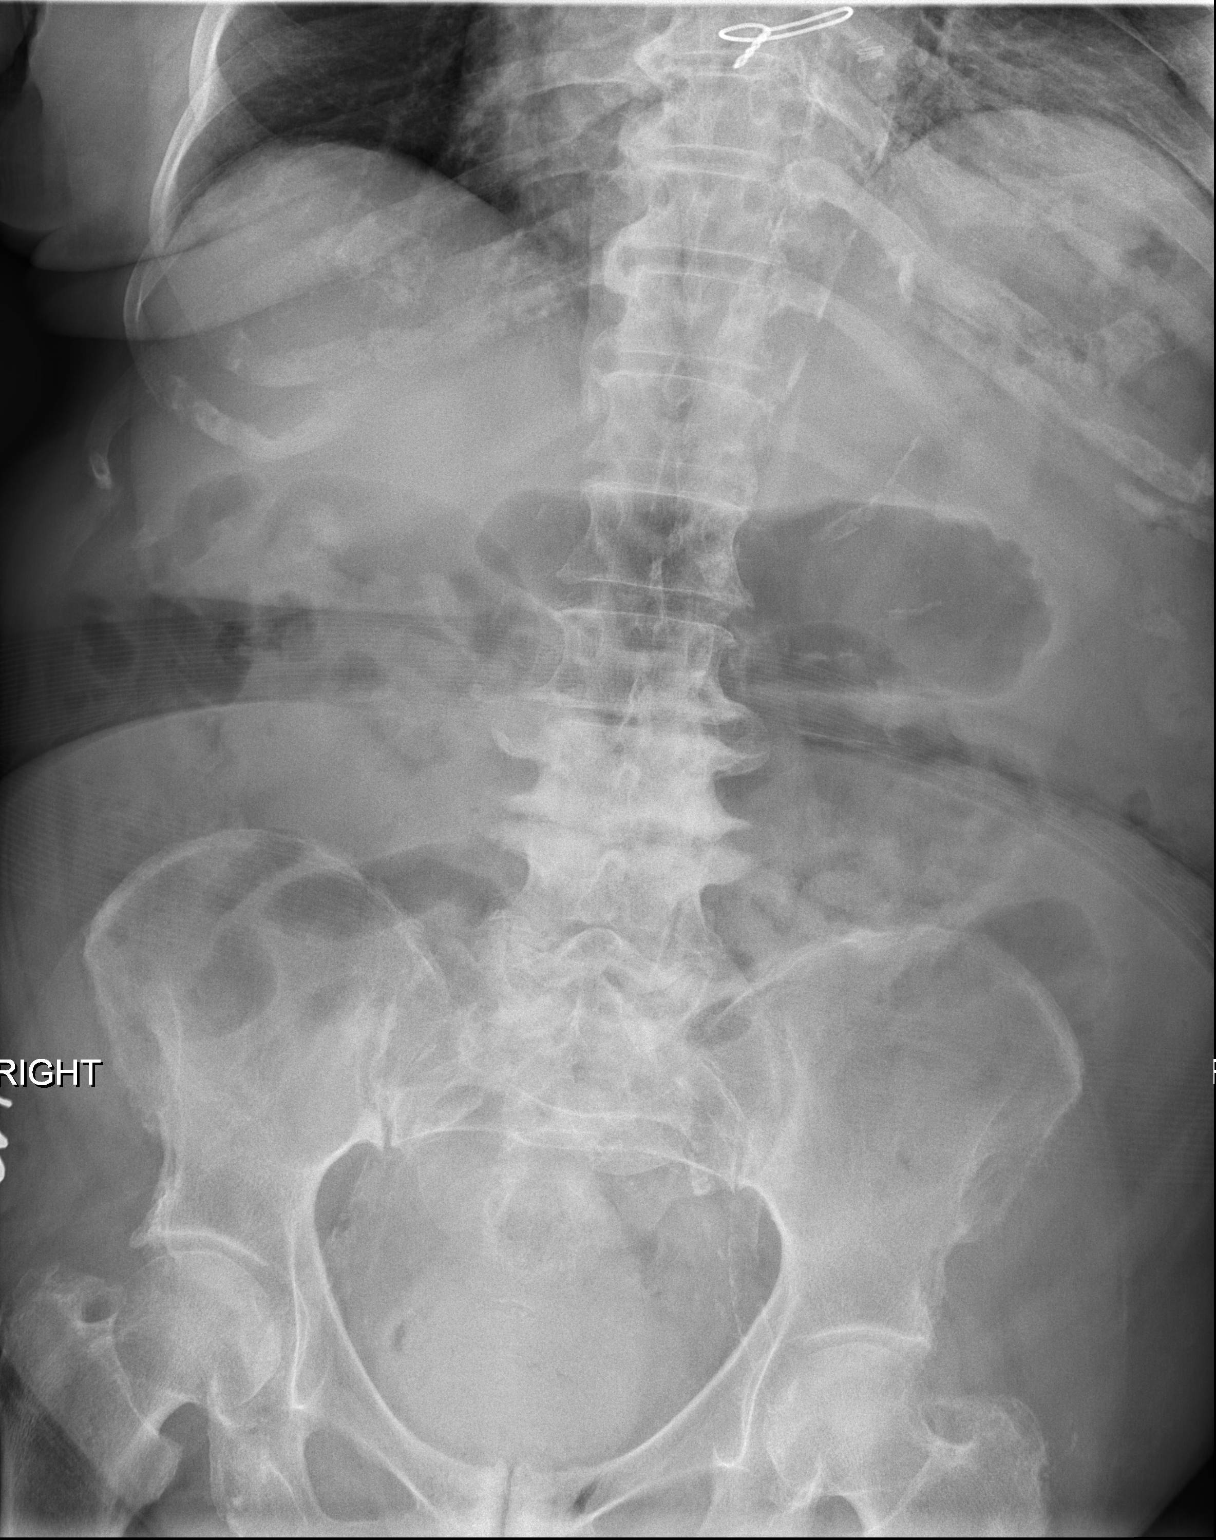

[1 of 1 positions shown; findings below may reference images not displayed]

FINDINGS: Stool distends the rectum with rectal dimension of 7.1 cm. Small to
moderate stool throughout the remainder the colon. No small bowel
dilatation to suggest obstruction. There are vascular
calcifications. No evidence of free air.
IMPRESSION: Stool distended rectum to 7.1 cm. Small to moderate stool throughout
the remainder the colon. No bowel obstruction.

## 2019-02-12 ENCOUNTER — Ambulatory Visit: Payer: Medicare HMO | Admitting: Family

## 2019-02-12 ENCOUNTER — Inpatient Hospital Stay (HOSPITAL_COMMUNITY): Admission: RE | Admit: 2019-02-12 | Payer: Medicare HMO | Source: Ambulatory Visit

## 2019-05-01 ENCOUNTER — Other Ambulatory Visit: Payer: Self-pay

## 2019-05-01 DIAGNOSIS — I739 Peripheral vascular disease, unspecified: Secondary | ICD-10-CM

## 2019-05-04 ENCOUNTER — Ambulatory Visit (INDEPENDENT_AMBULATORY_CARE_PROVIDER_SITE_OTHER): Payer: Medicare HMO | Admitting: Family

## 2019-05-04 ENCOUNTER — Encounter: Payer: Self-pay | Admitting: Family

## 2019-05-04 ENCOUNTER — Ambulatory Visit (HOSPITAL_COMMUNITY)
Admission: RE | Admit: 2019-05-04 | Discharge: 2019-05-04 | Disposition: A | Discharge status: Home / Self Care | Payer: Medicare HMO | Source: Ambulatory Visit | Attending: Family | Admitting: Family

## 2019-05-04 ENCOUNTER — Other Ambulatory Visit: Payer: Self-pay

## 2019-05-04 VITALS — BP 197/83 | HR 68 | Temp 97.7°F | Resp 12 | Ht <= 58 in | Wt 125.0 lb

## 2019-05-04 DIAGNOSIS — I739 Peripheral vascular disease, unspecified: Secondary | ICD-10-CM | POA: Diagnosis present

## 2019-05-04 DIAGNOSIS — I779 Disorder of arteries and arterioles, unspecified: Secondary | ICD-10-CM | POA: Diagnosis not present

## 2019-05-04 NOTE — Patient Instructions (Signed)
Peripheral Vascular Disease  Peripheral vascular disease (PVD) is a disease of the blood vessels that are not part of your heart and brain. A simple term for PVD is poor circulation. In most cases, PVD narrows the blood vessels that carry blood from your heart to the rest of your body. This can reduce the supply of blood to your arms, legs, and internal organs, like your stomach or kidneys. However, PVD most often affects a person's lower legs and feet. Without treatment, PVD tends to get worse. PVD can also lead to acute ischemic limb. This is when an arm or leg suddenly cannot get enough blood. This is a medical emergency. Follow these instructions at home: Lifestyle  Do not use any products that contain nicotine or tobacco, such as cigarettes and e-cigarettes. If you need help quitting, ask your doctor.  Lose weight if you are overweight. Or, stay at a healthy weight as told by your doctor.  Eat a diet that is low in fat and cholesterol. If you need help, ask your doctor.  Exercise regularly. Ask your doctor for activities that are right for you. General instructions  Take over-the-counter and prescription medicines only as told by your doctor.  Take good care of your feet: ? Wear comfortable shoes that fit well. ? Check your feet often for any cuts or sores.  Keep all follow-up visits as told by your doctor This is important. Contact a doctor if:  You have cramps in your legs when you walk.  You have leg pain when you are at rest.  You have coldness in a leg or foot.  Your skin changes.  You are unable to get or have an erection (erectile dysfunction).  You have cuts or sores on your feet that do not heal. Get help right away if:  Your arm or leg turns cold, numb, and blue.  Your arms or legs become red, warm, swollen, painful, or numb.  You have chest pain.  You have trouble breathing.  You suddenly have weakness in your face, arm, or leg.  You become very  confused or you cannot speak.  You suddenly have a very bad headache.  You suddenly cannot see. Summary  Peripheral vascular disease (PVD) is a disease of the blood vessels.  A simple term for PVD is poor circulation. Without treatment, PVD tends to get worse.  Treatment may include exercise, low fat and low cholesterol diet, and quitting smoking. This information is not intended to replace advice given to you by your health care provider. Make sure you discuss any questions you have with your health care provider. Document Released: 01/19/2010 Document Revised: 12/02/2016 Document Reviewed: 12/02/2016 Elsevier Interactive Patient Education  2019 Elsevier Inc.  

## 2019-05-04 NOTE — Progress Notes (Signed)
[facility] [place]   CC: Follow up peripheral artery occlusive disease  History of Present Illness Katie MorinMary Agnes [name] is a 85 y.o. 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  She states that she has had some weight loss and had difficulty swallowing but does not have any food fear or pain with eating.  At previous visits as is today, her primary complaint regarding her abdomen was constipation. She also has had difficulty with her medications.  Dr. Randie Heinzain last evaluated pt on 02-03-18. 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 She had multiple hospitalizations with abdominal pain. She had severely depressed ABIs bilaterally. But was free of lower extremity symptoms specifically having no wounds or claudication and from that standpoint Dr. Randie Heinzain advised to continue to follow her with ABIs in 6 months.   She currently denies post prandial abdominal pain. She states her appetite is alright.  She weighed 129 pounds on 02-03-18, weighs 125 pounds today; her weight is stable. She denies any more abdominal pain.  She denies constipation, but seems to have tenesmus.  Pt states she had a partial colectomy to remove some polyps, had complications from this.   She takes a daily 81 mg ASA and a statin.   She denies chest pain, denies dyspnea, denies headache; she is hypertensive now.   She denies pain or weakness in the muscles of her legs when she walks, denies non healing wounds in her lower extremities.  Her calves cramp when she sleeps.   Diabetic: no Tobacco use: former smoker, quit about age 85, started age 85  Pt meds include: Statin :Yes Betablocker: No ASA: Yes Other anticoagulants/antiplatelets: no  Past Medical History:  Diagnosis  Date  . CHF (congestive heart failure) (HCC)   . Coronary artery disease   . Hypertension   . Hypothyroidism     Social History Social History   Tobacco Use  . Smoking status: Former Games developermoker  . Smokeless tobacco: Never Used  . Tobacco comment: quit smoking over 30 yrs ago   Substance Use Topics  . Alcohol use: No    Frequency: Never  . Drug use: No    Family History Family History  Problem Relation Age of Onset  . Hypertension Brother     Past Surgical History:  Procedure Laterality Date  . cadiac stents    . CARDIAC CATHETERIZATION    . COLON SURGERY    . CORONARY ARTERY BYPASS GRAFT  2009    Allergies  Allergen Reactions  . Clams [Shellfish Allergy] Anaphylaxis  . Hydralazine Other (See Comments)    Possible cause of chest discomfort  . Red Dye Other (See Comments)    Constipation   . Sesame Seed [Sesame Oil] Swelling and Other (See Comments)    Wheezing     Current Outpatient Medications  Medication Sig Dispense Refill  . amLODipine (NORVASC) 5 MG tablet Take 1 tablet (5 mg total) by mouth daily. 30 tablet 2  . aspirin 81 MG chewable tablet Chew 81 mg by mouth daily.    . Cholecalciferol (VITAMIN D-3) 1000 units CAPS Take 1,000 Units by mouth daily.    . cloNIDine (CATAPRES) 0.1 MG tablet Take 1 tablet (0.1 mg total) by mouth 2 (two) times daily. 60 tablet 11  . diltiazem (CARDIZEM CD) 240  MG 24 hr capsule Take 240 mg by mouth daily.  1  . ENSURE PLUS (ENSURE PLUS) LIQD Take 237 mLs by mouth daily.    . isosorbide-hydrALAZINE (BIDIL) 20-37.5 MG tablet Take 1 tablet by mouth 3 (three) times daily. 90 tablet 2  . levothyroxine (SYNTHROID, LEVOTHROID) 50 MCG tablet Take 50 mcg by mouth daily.  3  . montelukast (SINGULAIR) 5 MG chewable tablet Chew 5 mg by mouth at bedtime.    . Multiple Vitamin (MULTIVITAMIN WITH MINERALS) TABS tablet Take 1 tablet by mouth daily.    . pravastatin (PRAVACHOL) 40 MG tablet Take 40 mg by mouth daily.  3  . vitamin B-12  (CYANOCOBALAMIN) 1000 MCG tablet Take 1,000 mcg by mouth daily.     No current facility-administered medications for this visit.     ROS: See HPI for pertinent positives and negatives.   Physical Examination  Vitals:   05/04/19 1020 05/04/19 1023  BP: (!) 190/76 (!) 197/83  Pulse: 68   Resp: 12   Temp: 97.7 F (36.5 C)   TempSrc: Temporal   SpO2: 96%   Weight: 125 lb (56.7 kg)   Height: 4\' 9"  (1.448 m)    Body mass index is 27.05 kg/m.  General: A&O x 3, WDWN, petite female. Gait: normal HEENT: No gross abnormalities.  Pulmonary: Respirations are non labored, CTAB, good air movement in all fields Cardiac: regular rhythm, no detected murmur.         Carotid Bruits Right Left   Negative Negative   Radial pulses are 1+ palpable bilaterally   Adominal aortic pulse is not palpable                         VASCULAR EXAM: Extremities without ischemic changes, without Gangrene; without open wounds.                                                                                                          LE Pulses Right Left       FEMORAL   palpable   palpable        POPLITEAL  not palpable   not palpable       POSTERIOR TIBIAL  not palpable   not palpable        DORSALIS PEDIS      ANTERIOR TIBIAL not palpable  not palpable    Abdomen: soft, NT, no palpable masses. Skin: no rashes, no cellulitis, no ulcers noted. Musculoskeletal: no muscle wasting or atrophy.  Neurologic: A&O X 3; appropriate affect, Sensation is normal; MOTOR FUNCTION:  moving all extremities equally, motor strength 5/5 throughout. Speech is fluent/normal. CN 2-12 intact. Psychiatric: Thought content is normal, mood appropriate for clinical situation.    ASSESSMENT: Katie MorinMary Agnes [name] is a 85 y.o. female who presents with: asymptomatic moderate/severe PAOD. There are no signs of ischemia in her feet or legs.    -Asyptomatic mild mesenteric ischemia: I discussed with Dr. Randie Heinzain at a previous visit, no  need to recheck mesenteric duplex unless pt develops  post prandial abdominal pain.    - Asymptomatic moderate/severe PAD: walk at least 30 minutes daily in a safe environment. She performs daily seated leg exercises and walks in her house staying active. Notify us if she develops concerns re the circulation in her feet or legs.    - Asymptomatic uncontrolled htn: I advised pt to call her PCP's office today ASAP to seek advice how to address.     DATA  ABI (Date: 05/04/2019): ABI Findings: +---------+------------------+-----+----------+--------+ Right    Rt Pressure (mmHg)IndexWaveform  Comment  +---------+------------------+-----+----------+--------+ Brachial 215                                       +---------+------------------+-----+----------+--------+ PTA      119               0.55 monophasic         +---------+------------------+-----+----------+--------+ DP       113               0.52 monophasic         +---------+------------------+-----+----------+--------+ Great Toe107               0.50                    +---------+------------------+-----+----------+--------+  +---------+------------------+-----+----------+-------+ Left     Lt Pressure (mmHg)IndexWaveform  Comment +---------+------------------+-----+----------+-------+ Brachial 216                                      +---------+------------------+-----+----------+-------+ PTA      95                0.44 monophasic        +---------+------------------+-----+----------+-------+ DP       92                0.43 monophasic        +---------+------------------+-----+----------+-------+ Great Toe68                0.31                   +---------+------------------+-----+----------+-------+  +-------+-----------+-----------+------------+------------+ ABI/TBIToday's ABIToday's TBIPrevious ABIPrevious  TBI +-------+-----------+-----------+------------+------------+ Right  0.55       0.50       0.65        0.32         +-------+-----------+-----------+------------+------------+ Left   0.44       0.31       0.46        0.29         +-------+-----------+-----------+------------+------------+   Summary: Right: Resting right ankle-brachial index indicates moderate right lower extremity arterial disease. The right toe-brachial index is abnormal.  Left: Resting left ankle-brachial index indicates severe left lower extremity arterial disease. The left toe-brachial index is abnormal.   PLAN:  Based on the patient's vascular studies and examination, pt will return to clinic in 1 year with ABI's.  I advised her to notify us if she develops concerns re the circulation in her feet or legs.   I discussed in depth with the patient the nature of atherosclerosis, and emphasized the importance of maximal medical management including strict control of blood pressure, blood glucose, and lipid levels, obtaining regular exercise, and cessation of smoking.  The patient is aware that without maximal medical management  the underlying atherosclerotic disease process will progress, limiting the benefit of any interventions.  The patient was given information about PAD including signs, symptoms, treatment, what symptoms should prompt the patient to seek immediate medical care, and risk reduction measures to take.  Charisse MarchSuzanne Nickel, RN, MSN, FNP-C [facility] [place] Office Phone: [phone]  Clinic MD: Randie HeinzCain  05/04/19 10:43 AM

## 2019-05-16 ENCOUNTER — Ambulatory Visit: Payer: Medicare HMO | Admitting: Podiatry

## 2019-06-29 DIAGNOSIS — R531 Weakness: Secondary | ICD-10-CM | POA: Insufficient documentation

## 2019-12-17 DIAGNOSIS — I6522 Occlusion and stenosis of left carotid artery: Secondary | ICD-10-CM | POA: Insufficient documentation

## 2020-04-22 DIAGNOSIS — R7303 Prediabetes: Secondary | ICD-10-CM | POA: Insufficient documentation

## 2020-05-27 ENCOUNTER — Other Ambulatory Visit: Payer: Self-pay

## 2020-05-27 ENCOUNTER — Encounter: Payer: Self-pay | Admitting: Physical Therapy

## 2020-05-27 ENCOUNTER — Ambulatory Visit: Payer: Medicare HMO | Attending: Family Medicine | Admitting: Physical Therapy

## 2020-05-27 DIAGNOSIS — R262 Difficulty in walking, not elsewhere classified: Secondary | ICD-10-CM

## 2020-05-27 DIAGNOSIS — M5441 Lumbago with sciatica, right side: Secondary | ICD-10-CM

## 2020-05-27 DIAGNOSIS — M6281 Muscle weakness (generalized): Secondary | ICD-10-CM | POA: Diagnosis present

## 2020-05-27 NOTE — Patient Instructions (Signed)
Access Code: [email or web address] URL: [email or web address] Date: 05/27/2020 Prepared by: Lysle Rubens  Exercises Supine Bridge - 1 x daily - 7 x weekly - 3 sets - 10 reps Supine Lower Trunk Rotation - 1 x daily - 7 x weekly - 3 sets - 10 reps Seated Table Hamstring Stretch - 1 x daily - 7 x weekly - 3 sets - 2 reps - 30 sec hold Sit to Stand with Hands on Knees - 1 x daily - 7 x weekly - 3 sets - 10 reps

## 2020-05-27 NOTE — Therapy (Signed)
[facility] [place] [address] [place], [place], [address] Phone: [phone]   Fax:  [phone]  Physical Therapy Evaluation  Patient Details  Name: Katie [name] MRN: [id number] Date of Birth: 1933/06/05 Referring Provider (PT): Delbert Harness   Encounter Date: 05/27/2020   PT End of Session - 05/27/20 1031    Visit Number 1    Date for PT Re-Evaluation 07/28/20    PT Start Time 0903    PT Stop Time 0930    PT Time Calculation (min) 27 min    Activity Tolerance Patient tolerated treatment well    Behavior During Therapy [facility] for tasks assessed/performed           Past Medical History:  Diagnosis Date   CHF (congestive heart failure) (HCC)    Coronary artery disease    Hypertension    Hypothyroidism     Past Surgical History:  Procedure Laterality Date   cadiac stents     CARDIAC CATHETERIZATION     COLON SURGERY     CORONARY ARTERY BYPASS GRAFT  2009    There were no vitals filed for this visit.    Subjective Assessment - 05/27/20 0906    Subjective Pt reports she thinks she hurt her back about a month ago running to go get something in the house. Pt states that her R LB is hurting from low back into R hip and posterior thigh. Pt reports some radiating pain into posterior R LE. Pt reports some N/T occasionally in RLE. Pt states that she has cramping in legs from poor circulation.    Pertinent History HTN    Limitations Walking;Sitting    Diagnostic tests xrays neg for fx    Patient Stated Goals get rid of pain    Currently in Pain? Yes    Pain Score 5     Pain Location Back    Pain Orientation Right    Pain Descriptors / Indicators Sore;Throbbing    Pain Type Acute pain    Pain Radiating Towards posterior RLE    Pain Onset More than a month ago    Pain Frequency Intermittent    Aggravating Factors  walking, sitting    Pain Relieving Factors heat, rest              [facility] PT Assessment -  05/27/20 0001      Assessment   Medical Diagnosis LBP    Referring Provider (PT) Delbert Harness    Hand Dominance Right    Prior Therapy PT for LBP      Precautions   Precautions None      Restrictions   Weight Bearing Restrictions No      Balance Screen   Has the patient fallen in the past 6 months No    Has the patient had a decrease in activity level because of a fear of falling?  No    Is the patient reluctant to leave their home because of a fear of falling?  No      Prior Function   Level of Independence Independent    Vocation Retired      Dispensing optician Intact      Functional Tests   Functional tests Sit to Stand      Sit to Stand   Comments leaning to LLE; difficulty with eccentric control      Posture/Postural Control   Posture/Postural Control Postural limitations  Postural Limitations Rounded Shoulders;Forward head;Increased thoracic kyphosis      ROM / Strength   AROM / PROM / Strength AROM;Strength      AROM   Overall AROM Comments lumbar ROM limited 50%      Strength   Overall Strength Comments LLE 4+/5, RLE 4-5      Flexibility   Soft Tissue Assessment /Muscle Length yes    Hamstrings tight      Palpation   Palpation comment tender to palpation L lumbar paraspinals, glute/ piriformis, and ITB      Transfers   Five time sit to stand comments  32 sec      Ambulation/Gait   Gait Comments antalgic gait with shortened stance time on RLE       Standardized Balance Assessment   Standardized Balance Assessment Timed Up and Go Test      Timed Up and Go Test   Normal TUG (seconds) 29    TUG Comments with no AD                      Objective measurements completed on examination: See above findings.       [facility] Adult PT Treatment/Exercise - 05/27/20 0001      Exercises   Exercises Lumbar      Lumbar Exercises: Stretches   Active Hamstring Stretch Right;Left;1 rep;30 seconds    Active Hamstring Stretch  Limitations longsitting    Lower Trunk Rotation 5 reps;10 seconds      Lumbar Exercises: Seated   Sit to Stand 5 reps    Sit to Stand Limitations hands on thighs progressing to no UE support      Lumbar Exercises: Supine   Bridge 10 reps;3 seconds    Bridge Limitations partial ROM                  PT Education - 05/27/20 1030    Education Details Pt educated on POC and HEP    Person(s) Educated Patient    Methods Explanation;Demonstration;Handout    Comprehension Verbalized understanding;Returned demonstration            PT Short Term Goals - 05/27/20 1159      PT SHORT TERM GOAL #1   Title independent with initial HEP    Time 2    Period Weeks    Status New    Target Date 06/10/20             PT Long Term Goals - 05/27/20 1159      PT LONG TERM GOAL #1   Title understand proper posture and body mechanics    Time 8    Period Weeks    Status New    Target Date 07/22/20      PT LONG TERM GOAL #2   Title decrease pain 50%    Time 8    Period Weeks    Status New    Target Date 07/22/20      PT LONG TERM GOAL #3   Title increase lumbar ROM 25%    Time 8    Period Weeks    Status New    Target Date 07/22/20      PT LONG TERM GOAL #4   Title walk 200 feet without rest    Time 8    Period Weeks    Status New    Target Date 07/22/20  Plan - 05/27/20 1045    Clinical Impression Statement Pt presents to clinic with reports of LBP present for ~1 month. Pt reports occasional radiating pain into posterior RLE with N/T. Pt demos decreased lumbar ROM, RLE strength deficits as compared to LLE, flexibility deficits, and antalgic gait with decreased gait speed without AD.    Personal Factors and Comorbidities Comorbidity 1    Comorbidities HTN    Examination-Activity Limitations Bend;Sit;Squat;Stand    Examination-Participation Restrictions Community Activity;Interpersonal Relationship    Stability/Clinical Decision Making  Stable/Uncomplicated    Clinical Decision Making Low    Rehab Potential Good    PT Frequency 2x / week    PT Duration 6 weeks    PT Treatment/Interventions ADLs/Self Care Home Management;Electrical Stimulation;Iontophoresis 4mg /ml Dexamethasone;Moist Heat;Gait training;Stair training;Functional mobility training;Therapeutic activities;Therapeutic exercise;Balance training;Neuromuscular re-education;Patient/family education;Manual techniques;Passive range of motion;Dry needling    PT Next Visit Plan review HEP, LE strenth/flexibility, manual/modalities as indicated    PT Home Exercise Plan STS, supine bridges, lower trunk rotations, hamstring stretch    Consulted and Agree with Plan of Care Patient           Patient will benefit from skilled therapeutic intervention in order to improve the following deficits and impairments:  Abnormal gait, Decreased range of motion, Pain, Hypomobility, Decreased strength  Visit Diagnosis: Acute right-sided low back pain with right-sided sciatica  Muscle weakness (generalized)  Difficulty in walking, not elsewhere classified     Problem List Patient Active Problem List   Diagnosis Date Noted   Hypokalemia 01/30/2018   Sepsis (HCC) 01/30/2018   SIRS (systemic inflammatory response syndrome) (HCC) 01/30/2018   Influenza due to influenza virus, type A, human 01/30/2018   Chest pain    Generalized abdominal pain    Nausea and vomiting    Generalized abdominal tenderness    UTI (urinary tract infection) 12/19/2017   Viral gastroenteritis 12/18/2017   AKI (acute kidney injury) (HCC) 12/18/2017   Aortic atherosclerosis (HCC) 12/18/2017   CAD (coronary artery disease) 12/18/2017   HTN (hypertension) 12/18/2017   Hypothyroidism 12/18/2017   02/15/2018, PT, DPT Lysle Rubens Kyrielle Urbanski 05/27/2020, 12:01 PM  [facility] [place] [address] [place], [place], [place] Phone: [phone]    Fax:  [phone]  Name: Johnye Kist MRN: Lance Morin Date of Birth: 1933-07-05

## 2020-05-29 ENCOUNTER — Encounter: Payer: Self-pay | Admitting: Physical Therapy

## 2020-05-29 ENCOUNTER — Ambulatory Visit: Payer: Medicare HMO | Admitting: Physical Therapy

## 2020-05-29 ENCOUNTER — Other Ambulatory Visit: Payer: Self-pay

## 2020-05-29 DIAGNOSIS — R262 Difficulty in walking, not elsewhere classified: Secondary | ICD-10-CM

## 2020-05-29 DIAGNOSIS — M6281 Muscle weakness (generalized): Secondary | ICD-10-CM

## 2020-05-29 DIAGNOSIS — M5441 Lumbago with sciatica, right side: Secondary | ICD-10-CM | POA: Diagnosis not present

## 2020-05-29 NOTE — Therapy (Signed)
[facility] [place] [address] [place], [place], [address] Phone: [phone]   Fax:  [phone]  Physical Therapy Treatment  Patient Details  Name: Katie [name] MRN: [id number] Date of Birth: 05/21/1933 Referring Provider (PT): Delbert Harness   Encounter Date: 05/29/2020   PT End of Session - 05/29/20 1010    Visit Number 2    Date for PT Re-Evaluation 07/28/20    PT Start Time 0930    PT Stop Time 1014    PT Time Calculation (min) 44 min    Activity Tolerance Patient tolerated treatment well    Behavior During Therapy [facility] for tasks assessed/performed           Past Medical History:  Diagnosis Date   CHF (congestive heart failure) (HCC)    Coronary artery disease    Hypertension    Hypothyroidism     Past Surgical History:  Procedure Laterality Date   cadiac stents     CARDIAC CATHETERIZATION     COLON SURGERY     CORONARY ARTERY BYPASS GRAFT  2009    There were no vitals filed for this visit.   Subjective Assessment - 05/29/20 0933    Subjective Feeling a lot better than I did Tuesday,    Currently in Pain? Yes    Pain Score 5     Pain Location Leg    Pain Orientation Right                             [facility] Adult PT Treatment/Exercise - 05/29/20 0001      Lumbar Exercises: Stretches   Passive Hamstring Stretch Left;Right;4 reps;10 seconds;20 seconds    Single Knee to Chest Stretch Right;Left;3 reps;10 seconds    Lower Trunk Rotation 5 reps;10 seconds    ITB Stretch Right;4 reps;10 seconds    Piriformis Stretch Right;Left;4 reps;10 seconds      Lumbar Exercises: Aerobic   UBE (Upper Arm Bike) L1.5 x2 min each     Nustep L3 x       Lumbar Exercises: Seated   Sit to Stand 5 reps   x2    Other Seated Lumbar Exercises Seated HS curls yellow 2x10     Other Seated Lumbar Exercises seated marches 2x10                     PT Short Term Goals - 05/27/20  1159      PT SHORT TERM GOAL #1   Title independent with initial HEP    Time 2    Period Weeks    Status New    Target Date 06/10/20             PT Long Term Goals - 05/27/20 1159      PT LONG TERM GOAL #1   Title understand proper posture and body mechanics    Time 8    Period Weeks    Status New    Target Date 07/22/20      PT LONG TERM GOAL #2   Title decrease pain 50%    Time 8    Period Weeks    Status New    Target Date 07/22/20      PT LONG TERM GOAL #3   Title increase lumbar ROM 25%    Time 8    Period Weeks    Status New  Target Date 07/22/20      PT LONG TERM GOAL #4   Title walk 200 feet without rest    Time 8    Period Weeks    Status New    Target Date 07/22/20                 Plan - 05/29/20 1011    Clinical Impression Statement Pt tolerated an intimal progression to TE fair. She did well with sit to stands, she does compensate using LLE more than R.  Cues was given to correct compensation with little compliance. Some HS cramping in R HS while doing the second set of curls with the L. Exercise interventions was discontinues and moved to stretching. Pt has bilateral HS and piriformis tightness noted with passive stretching.    Personal Factors and Comorbidities Comorbidity 1    Examination-Activity Limitations Bend;Sit;Squat;Stand    Stability/Clinical Decision Making Stable/Uncomplicated    Rehab Potential Good    PT Frequency 2x / week    PT Duration 6 weeks    PT Treatment/Interventions ADLs/Self Care Home Management;Electrical Stimulation;Iontophoresis 4mg /ml Dexamethasone;Moist Heat;Gait training;Stair training;Functional mobility training;Therapeutic activities;Therapeutic exercise;Balance training;Neuromuscular re-education;Patient/family education;Manual techniques;Passive range of motion;Dry needling    PT Next Visit Plan LE strenth/flexibility, manual/modalities as indicated           Patient will benefit from skilled  therapeutic intervention in order to improve the following deficits and impairments:  Abnormal gait, Decreased range of motion, Pain, Hypomobility, Decreased strength  Visit Diagnosis: Difficulty in walking, not elsewhere classified  Muscle weakness (generalized)  Acute right-sided low back pain with right-sided sciatica     Problem List Patient Active Problem List   Diagnosis Date Noted   Hypokalemia 01/30/2018   Sepsis (HCC) 01/30/2018   SIRS (systemic inflammatory response syndrome) (HCC) 01/30/2018   Influenza due to influenza virus, type A, human 01/30/2018   Chest pain    Generalized abdominal pain    Nausea and vomiting    Generalized abdominal tenderness    UTI (urinary tract infection) 12/19/2017   Viral gastroenteritis 12/18/2017   AKI (acute kidney injury) (HCC) 12/18/2017   Aortic atherosclerosis (HCC) 12/18/2017   CAD (coronary artery disease) 12/18/2017   HTN (hypertension) 12/18/2017   Hypothyroidism 12/18/2017    02/15/2018, PTA 05/29/2020, 10:17 AM  [facility] [place] [address] [place], [place], [place] Phone: [phone]   Fax:  [phone]  Name: Katie [name] MRN: Lance Morin Date of Birth: 1933-10-21

## 2020-06-03 ENCOUNTER — Other Ambulatory Visit: Payer: Self-pay

## 2020-06-03 ENCOUNTER — Encounter: Payer: Self-pay | Admitting: Physical Therapy

## 2020-06-03 ENCOUNTER — Ambulatory Visit: Payer: Medicare HMO | Admitting: Physical Therapy

## 2020-06-03 DIAGNOSIS — M5441 Lumbago with sciatica, right side: Secondary | ICD-10-CM | POA: Diagnosis not present

## 2020-06-03 DIAGNOSIS — R262 Difficulty in walking, not elsewhere classified: Secondary | ICD-10-CM

## 2020-06-03 DIAGNOSIS — M6281 Muscle weakness (generalized): Secondary | ICD-10-CM

## 2020-06-03 NOTE — Therapy (Signed)
[place], [place], [address] Phone: [phone]   Fax:  [phone]  Physical Therapy Treatment  Patient Details  Name: Katie [name] MRN: [id number] Date of Birth: 22-Dec-1932 Referring Provider (PT): Suzanna Obey   Encounter Date: 06/03/2020   PT End of Session - 06/03/20 1041    Visit Number 3    Date for PT Re-Evaluation 07/28/20    PT Start Time 1001    PT Stop Time 1043    PT Time Calculation (min) 42 min    Activity Tolerance Patient tolerated treatment well    Behavior During Therapy [facility] for tasks assessed/performed           Past Medical History:  Diagnosis Date  . CHF (congestive heart failure) ([place])   . Coronary artery disease   . Hypertension   . Hypothyroidism     Past Surgical History:  Procedure Laterality Date  . cadiac stents    . CARDIAC CATHETERIZATION    . COLON SURGERY    . CORONARY ARTERY BYPASS GRAFT  2009    There were no vitals filed for this visit.   Subjective Assessment - 06/03/20 0959    Subjective "I feel kinda tight today"    Currently in Pain? No/denies                             [facility] Adult PT Treatment/Exercise - 06/03/20 0001      Lumbar Exercises: Stretches   Passive Hamstring Stretch Left;Right;4 reps;10 seconds;20 seconds    Single Knee to Chest Stretch Right;Left;3 reps;10 seconds    ITB Stretch Right;4 reps;10 seconds    Piriformis Stretch Right;Left;4 reps;10 seconds      Lumbar Exercises: Aerobic   Nustep L3 x 21mn       Lumbar Exercises: Standing   Row Theraband;20 reps;Both;Strengthening    Theraband Level (Row) Level 2 (Red)      Lumbar Exercises: Seated   Long Arc Quad on Chair Both;2 sets;10 reps    LAQ on Chair Weights (lbs) 2    Sit to Stand 5 reps   x2                   PT Short Term Goals - 06/03/20 1044      PT SHORT TERM GOAL #1   Title independent with initial HEP    Status  Achieved             PT Long Term Goals - 06/03/20 1043      PT LONG TERM GOAL #1   Title understand proper posture and body mechanics    Status Partially Met      PT LONG TERM GOAL #2   Title decrease pain 50%                 Plan - 06/03/20 1041    Clinical Impression Statement Pt able to complete all of today's interventions. She is very prone to cramping with activity. She reports some cramping in her L shoulder after shoulder extensions. She also report a cramping sensation in the L HS after seven reps of bridges. She remain very tight int he piriformis and ITB.    Personal Factors and Comorbidities Comorbidity 1    Comorbidities HTN    Examination-Participation Restrictions Community Activity;Interpersonal Relationship    Stability/Clinical Decision Making Stable/Uncomplicated    Rehab Potential Good  PT Frequency 2x / week    PT Duration 6 weeks    PT Treatment/Interventions ADLs/Self Care Home Management;Electrical Stimulation;Iontophoresis '4mg'$ /ml Dexamethasone;Moist Heat;Gait training;Stair training;Functional mobility training;Therapeutic activities;Therapeutic exercise;Balance training;Neuromuscular re-education;Patient/family education;Manual techniques;Passive range of motion;Dry needling    PT Next Visit Plan LE strenth/flexibility, manual/modalities as indicated           Patient will benefit from skilled therapeutic intervention in order to improve the following deficits and impairments:  Abnormal gait, Decreased range of motion, Pain, Hypomobility, Decreased strength  Visit Diagnosis: Acute right-sided low back pain with right-sided sciatica  Muscle weakness (generalized)  Difficulty in walking, not elsewhere classified     Problem List Patient Active Problem List   Diagnosis Date Noted  . Hypokalemia 01/30/2018  . Sepsis ([place]) 01/30/2018  . SIRS (systemic inflammatory response syndrome) ([place]) 01/30/2018  . Influenza due to influenza  virus, type A, human 01/30/2018  . Chest pain   . Generalized abdominal pain   . Nausea and vomiting   . Generalized abdominal tenderness   . UTI (urinary tract infection) 12/19/2017  . Viral gastroenteritis 12/18/2017  . AKI (acute kidney injury) ([place]) 12/18/2017  . Aortic atherosclerosis ([place]) 12/18/2017  . CAD (coronary artery disease) 12/18/2017  . HTN (hypertension) 12/18/2017  . Hypothyroidism 12/18/2017    Scot Jun, PTA 06/03/2020, 10:44 AM  [place], [place], [address] Phone: [phone]   Fax:  [phone]  Name: Katie [name] MRN: [id number] Date of Birth: 1932-12-19

## 2020-06-05 ENCOUNTER — Encounter: Payer: Self-pay | Admitting: Physical Therapy

## 2020-06-05 ENCOUNTER — Other Ambulatory Visit: Payer: Self-pay

## 2020-06-05 ENCOUNTER — Ambulatory Visit: Payer: Medicare HMO | Admitting: Physical Therapy

## 2020-06-05 DIAGNOSIS — M5441 Lumbago with sciatica, right side: Secondary | ICD-10-CM | POA: Diagnosis not present

## 2020-06-05 DIAGNOSIS — R262 Difficulty in walking, not elsewhere classified: Secondary | ICD-10-CM

## 2020-06-05 DIAGNOSIS — M6281 Muscle weakness (generalized): Secondary | ICD-10-CM

## 2020-06-05 NOTE — Therapy (Signed)
[place], [place], [address] Phone: [phone]   Fax:  [phone]  Physical Therapy Treatment  Patient Details  Name: Katie [name] MRN: [id number] Date of Birth: 06/20/1933 Referring Provider (PT): Suzanna Obey   Encounter Date: 06/05/2020   PT End of Session - 06/05/20 1229    Visit Number 4    Date for PT Re-Evaluation 07/28/20    PT Start Time 1145    PT Stop Time 1225    PT Time Calculation (min) 40 min    Activity Tolerance Patient tolerated treatment well    Behavior During Therapy [facility] for tasks assessed/performed           Past Medical History:  Diagnosis Date  . CHF (congestive heart failure) ([place])   . Coronary artery disease   . Hypertension   . Hypothyroidism     Past Surgical History:  Procedure Laterality Date  . cadiac stents    . CARDIAC CATHETERIZATION    . COLON SURGERY    . CORONARY ARTERY BYPASS GRAFT  2009    There were no vitals filed for this visit.   Subjective Assessment - 06/05/20 1145    Subjective Not too bad, slept on her R side last night, has some swelling and creases in the R leg    Currently in Pain? Yes    Pain Score 6     Pain Location Leg    Pain Orientation Right                             [facility] Adult PT Treatment/Exercise - 06/05/20 0001      Lumbar Exercises: Stretches   Passive Hamstring Stretch Left;Right;4 reps;10 seconds;20 seconds    Single Knee to Chest Stretch Right;Left;3 reps;10 seconds    ITB Stretch Right;4 reps;10 seconds    Piriformis Stretch Right;Left;4 reps;10 seconds      Lumbar Exercises: Standing   Row Theraband;20 reps;Both;Strengthening    Theraband Level (Row) Level 2 (Red)      Lumbar Exercises: Supine   Bridge 10 reps;3 seconds   x2   Other Supine Lumbar Exercises hooklying marches x10     Other Supine Lumbar Exercises hooklying ball squeezes 2x10                     PT  Short Term Goals - 06/03/20 1044      PT SHORT TERM GOAL #1   Title independent with initial HEP    Status Achieved             PT Long Term Goals - 06/03/20 1043      PT LONG TERM GOAL #1   Title understand proper posture and body mechanics    Status Partially Met      PT LONG TERM GOAL #2   Title decrease pain 50%                 Plan - 06/05/20 1229    Clinical Impression Statement No progression towards goals. Pt enter clinic reporting some RLE pain from sleeping on it. After aerobic warm up pt complete some Tband rows. After rows pt has a limp with ambulation reporting pain in her lateral R shaft. Pt instructed to room and lay supine. after stretches pt was able to complete supine bridges. After bridges she was able to performed a few hook lying marches  before reporting cramping in her LE. Her hers are very tight with passive stretches. Most LE activities causes cramping in her LE. She reports having blood work done but having difficulty getting her results. informed pt to contact her MD to inform them of her issues.    Personal Factors and Comorbidities Comorbidity 1    Comorbidities HTN    Examination-Activity Limitations Bend;Sit;Squat;Stand    Examination-Participation Restrictions Community Activity;Interpersonal Relationship    Stability/Clinical Decision Making Stable/Uncomplicated    Rehab Potential Good    PT Frequency 2x / week    PT Duration 6 weeks    PT Treatment/Interventions ADLs/Self Care Home Management;Electrical Stimulation;Iontophoresis 38m/ml Dexamethasone;Moist Heat;Gait training;Stair training;Functional mobility training;Therapeutic activities;Therapeutic exercise;Balance training;Neuromuscular re-education;Patient/family education;Manual techniques;Passive range of motion;Dry needling    PT Next Visit Plan LE strenth/flexibility, manual/modalities as indicated           Patient will benefit from skilled therapeutic intervention in order to  improve the following deficits and impairments:  Abnormal gait, Decreased range of motion, Pain, Hypomobility, Decreased strength  Visit Diagnosis: Muscle weakness (generalized)  Difficulty in walking, not elsewhere classified  Acute right-sided low back pain with right-sided sciatica     Problem List Patient Active Problem List   Diagnosis Date Noted  . Hypokalemia 01/30/2018  . Sepsis ([place] 01/30/2018  . SIRS (systemic inflammatory response syndrome) ([place] 01/30/2018  . Influenza due to influenza virus, type A, human 01/30/2018  . Chest pain   . Generalized abdominal pain   . Nausea and vomiting   . Generalized abdominal tenderness   . UTI (urinary tract infection) 12/19/2017  . Viral gastroenteritis 12/18/2017  . AKI (acute kidney injury) ([place] 12/18/2017  . Aortic atherosclerosis ([place] 12/18/2017  . CAD (coronary artery disease) 12/18/2017  . HTN (hypertension) 12/18/2017  . Hypothyroidism 12/18/2017    RScot Jun7/29/2021, 12:34 PM  [address] [address]Phone: [phone]  Fax:  [phone] Name: Katie BiasMRN: [id number] of Birth: 804/04/1933

## 2020-06-30 ENCOUNTER — Other Ambulatory Visit: Payer: Self-pay

## 2020-06-30 DIAGNOSIS — I739 Peripheral vascular disease, unspecified: Secondary | ICD-10-CM

## 2020-07-10 ENCOUNTER — Ambulatory Visit (HOSPITAL_COMMUNITY)
Admission: RE | Admit: 2020-07-10 | Discharge: 2020-07-10 | Disposition: A | Discharge status: Home / Self Care | Payer: Medicare HMO | Source: Ambulatory Visit | Attending: Vascular Surgery | Admitting: Vascular Surgery

## 2020-07-10 ENCOUNTER — Ambulatory Visit (INDEPENDENT_AMBULATORY_CARE_PROVIDER_SITE_OTHER): Payer: Medicare HMO | Admitting: Physician Assistant

## 2020-07-10 ENCOUNTER — Other Ambulatory Visit: Payer: Self-pay

## 2020-07-10 VITALS — BP 151/58 | HR 78 | Temp 97.2°F | Resp 16 | Ht <= 58 in | Wt 136.0 lb

## 2020-07-10 DIAGNOSIS — I739 Peripheral vascular disease, unspecified: Secondary | ICD-10-CM | POA: Insufficient documentation

## 2020-07-10 DIAGNOSIS — K551 Chronic vascular disorders of intestine: Secondary | ICD-10-CM

## 2020-07-10 NOTE — Progress Notes (Signed)
Established PAD   History of Present Illness   Katie [name] is a 86 y.o. (1933-09-18) female who presents to go over studies related to PAD.  She was last seen in June of last year with right ABI of 0.55 and left ABI of 0.44.  She continues to be without claudication, rest pain, or nonhealing wounds.  She denies tobacco use.  She is taking Plavix, aspirin, and a statin daily.  She was also previously followed for mesenteric stenosis.  She had a complete resolution of symptoms several visits ago without intervention.  She continues to be without symptoms including postprandial pain, weight loss, or fear of food.  The patient's PMH, PSH, SH, and FamHx were reviewed and are unchanged from prior visit.  Current Outpatient Medications  Medication Sig Dispense Refill  . amLODipine (NORVASC) 5 MG tablet Take 1 tablet (5 mg total) by mouth daily. 30 tablet 2  . aspirin 81 MG chewable tablet Chew 81 mg by mouth daily.    . Cholecalciferol (VITAMIN D-3) 1000 units CAPS Take 1,000 Units by mouth daily.    . clopidogrel (PLAVIX) 75 MG tablet Take 75 mg by mouth daily.    Marland Kitchen ENSURE PLUS (ENSURE PLUS) LIQD Take 237 mLs by mouth daily.    . hydrochlorothiazide (HYDRODIURIL) 25 MG tablet Take 25 mg by mouth daily.    Marland Kitchen levothyroxine (SYNTHROID, LEVOTHROID) 50 MCG tablet Take 50 mcg by mouth daily.  3  . losartan (COZAAR) 100 MG tablet TAKE ONE TABLET (100 MG DOSE) BY MOUTH ONCE DAILY    . montelukast (SINGULAIR) 5 MG chewable tablet Chew 5 mg by mouth at bedtime.    . Multiple Vitamin (MULTIVITAMIN WITH MINERALS) TABS tablet Take 1 tablet by mouth daily.    . pravastatin (PRAVACHOL) 40 MG tablet Take 40 mg by mouth daily.  3  . spironolactone (ALDACTONE) 25 MG tablet Take 12.5 mg by mouth daily.    . vitamin B-12 (CYANOCOBALAMIN) 1000 MCG tablet Take 1,000 mcg by mouth daily.    . cloNIDine (CATAPRES) 0.1 MG tablet Take 1 tablet (0.1 mg total) by mouth 2 (two) times daily. (Patient not taking:  Reported on 07/10/2020) 60 tablet 11  . diltiazem (CARDIZEM CD) 240 MG 24 hr capsule Take 240 mg by mouth daily. (Patient not taking: Reported on 07/10/2020)  1  . isosorbide-hydrALAZINE (BIDIL) 20-37.5 MG tablet Take 1 tablet by mouth 3 (three) times daily. (Patient not taking: Reported on 07/10/2020) 90 tablet 2   No current facility-administered medications for this visit.    REVIEW OF SYSTEMS (negative unless checked):   Cardiac:  []  Chest pain or chest pressure? []  Shortness of breath upon activity? []  Shortness of breath when lying flat? []  Irregular heart rhythm?  Vascular:  []  Pain in calf, thigh, or hip brought on by walking? []  Pain in feet at night that wakes you up from your sleep? []  Blood clot in your veins? []  Leg swelling?  Pulmonary:  []  Oxygen at home? []  Productive cough? []  Wheezing?  Neurologic:  []  Sudden weakness in arms or legs? []  Sudden numbness in arms or legs? []  Sudden onset of difficult speaking or slurred speech? []  Temporary loss of vision in one eye? []  Problems with dizziness?  Gastrointestinal:  []  Blood in stool? []  Vomited blood?  Genitourinary:  []  Burning when urinating? []  Blood in urine?  Psychiatric:  []  Major depression  Hematologic:  []  Bleeding problems? []  Problems with blood clotting?  Dermatologic:  []   Rashes or ulcers?  Constitutional:  []  Fever or chills?  Ear/Nose/Throat:  []  Change in hearing? []  Nose bleeds? []  Sore throat?  Musculoskeletal:  [x]  Back pain? []  Joint pain? []  Muscle pain?   Physical Examination   Vitals:   07/10/20 1045  BP: (!) 151/58  Pulse: 78  Resp: 16  Temp: (!) 97.2 F (36.2 C)  TempSrc: Temporal  SpO2: 99%  Weight: 136 lb (61.7 kg)  Height: 4\' 9"  (1.448 m)   Body mass index is 29.43 kg/m.  General:  WDWN in NAD; vital signs documented above Gait: Not observed HENT: WNL, normocephalic Pulmonary: normal non-labored breathing , without Rales, rhonchi,   wheezing Cardiac: regular HR Abdomen: soft, NT, no masses Skin: without rashes Vascular Exam/Pulses:  Right Left  Radial 2+ (normal) 2+ (normal)  Femoral 2+ (normal) 2+ (normal)  Popliteal absent absent  DP absent absent  PT absent absent   Extremities: without ischemic changes, without Gangrene , without cellulitis; without open wounds;  Musculoskeletal: no muscle wasting or atrophy  Neurologic: A&O X 3;  No focal weakness or paresthesias are detected Psychiatric:  The pt has Normal affect.  Non-Invasive Vascular imaging   ABI  ABI/TBIToday's ABIToday's TBIPrevious ABIPrevious TBI  +-------+-----------+-----------+------------+------------+  Right 0.58    0     0.55    0.5       +-------+-----------+-----------+------------+------------+  Left  0.46    0.22    0.44    0.31       Medical Decision Making   Katie [name] is a 85 y.o. female who presents for surveillance of PAD  ABIs are essentially unchanged compared to study performed in June 2020 Despite decreased ABIs bilaterally, patient is without claudication, rest pain, or nonhealing wounds No indication for revascularization of BLE Patient also continues to be without mesenteric stenosis symptoms; she will need a mesenteric duplex if symptoms return Continue aspirin, Plavix, and statin daily Recheck ABIs in 1 year  PA-C [facility] Office: [phone]  Clinic MD: 09/09/20

## 2020-12-02 DIAGNOSIS — H9190 Unspecified hearing loss, unspecified ear: Secondary | ICD-10-CM | POA: Insufficient documentation

## 2020-12-30 DIAGNOSIS — H9110 Presbycusis, unspecified ear: Secondary | ICD-10-CM | POA: Insufficient documentation

## 2021-04-07 ENCOUNTER — Other Ambulatory Visit: Payer: Self-pay

## 2021-04-07 DIAGNOSIS — I739 Peripheral vascular disease, unspecified: Secondary | ICD-10-CM

## 2021-07-10 DIAGNOSIS — N281 Cyst of kidney, acquired: Secondary | ICD-10-CM | POA: Insufficient documentation

## 2021-07-15 ENCOUNTER — Ambulatory Visit (INDEPENDENT_AMBULATORY_CARE_PROVIDER_SITE_OTHER): Payer: Medicare HMO | Admitting: Physician Assistant

## 2021-07-15 ENCOUNTER — Other Ambulatory Visit: Payer: Self-pay

## 2021-07-15 ENCOUNTER — Ambulatory Visit (HOSPITAL_COMMUNITY)
Admission: RE | Admit: 2021-07-15 | Discharge: 2021-07-15 | Disposition: A | Discharge status: Home / Self Care | Payer: Medicare HMO | Source: Ambulatory Visit | Attending: Vascular Surgery | Admitting: Vascular Surgery

## 2021-07-15 VITALS — BP 155/67 | HR 74 | Temp 98.0°F | Resp 20 | Ht <= 58 in

## 2021-07-15 DIAGNOSIS — I4891 Unspecified atrial fibrillation: Secondary | ICD-10-CM | POA: Insufficient documentation

## 2021-07-15 DIAGNOSIS — M797 Fibromyalgia: Secondary | ICD-10-CM | POA: Insufficient documentation

## 2021-07-15 DIAGNOSIS — I739 Peripheral vascular disease, unspecified: Secondary | ICD-10-CM

## 2021-07-15 DIAGNOSIS — E785 Hyperlipidemia, unspecified: Secondary | ICD-10-CM | POA: Insufficient documentation

## 2021-07-15 NOTE — Progress Notes (Signed)
Peripheral Arterial Disease Follow-Up   VASCULAR SURGERY ASSESSMENT & PLAN:   Katie [name] is a 87 y.o. female presents for routine follow-up of PAD.  Stable peripheral arterial disease.  No significant change in ABIs as compared to 1 year ago.  Patient is without claudication or rest pain. Continue optimal medical management of hypertension and follow-up with primary care physician. Encouraged continuation of complete smoking cessation. Continue the following medications: Plavix, aspirin and statin Follow-up in 1 year with ABIs  SUBJECTIVE:   The patient denies lower extremity pain with exercise or rest pain.  Denies skin loss or ulceration. She has not complaints of deafness and pain in multiple joints including her shoulders and right hip.  She states she has a slipped disc.  She was recently seen in the emergency department due to muscular stiffness and was prescribed Zanaflex.  She states this has given her good relief.  PHYSICAL EXAM:   Vitals:   07/15/21 1015  BP: (!) 155/67  Pulse: 74  Resp: 20  Temp: 98 F (36.7 C)  SpO2: 100%    General appearance: Well-developed, well-nourished in no apparent distress Gait: with RW. No ataxia. Neurologic: Alert and oriented x 4. Cardiovascular: Heart rate and rhythm are regular.   Abdomen: No palpable pulsatile mass. Extremities: Skin intact.  Both feet are warm and well perfused.  Motor function and sensation intact. Pedal pulses are not palpable. Pulse exam: 1+ femoral pulses. 2+ symmetrical radial pulses. 3+ brachial pulses.  NON-INVASIVE VASCULAR STUDIES   07/15/2021 ABI/TBIToday's ABIToday's TBIPrevious ABIPrevious TBI  +-------+-----------+-----------+------------+------------+  Right  0.58       0.35       0.58        0             +-------+-----------+-----------+------------+------------+  Left   0.45       0.27       0.46        0.22           +-------+-----------+-----------+------------+------------+  Right great toe pressure is 66.  Waveforms are monophasic Left great toe pressure is 50.  Waveforms are monophasic    Bilateral ABIs appear essentially unchanged compared to prior study on  07/10/2020.     Summary:  Right: Resting right ankle-brachial index indicates moderate right lower  extremity arterial disease. The right toe-brachial index is abnormal.   Left: Resting left ankle-brachial index indicates severe left lower  extremity arterial disease. The left toe-brachial index is abnormal.    *See table(s) above for measurements and observations.     Preliminary     PROBLEM LIST:    The patient's past medical history, past surgical history, family history, social history, allergy list and medication list are reviewed. Significant for CAD, a.fib, hypertension. Not diabetic.   CURRENT MEDS:    Current Outpatient Medications:    amLODipine (NORVASC) 5 MG tablet, Take 1 tablet (5 mg total) by mouth daily., Disp: 30 tablet, Rfl: 2   aspirin 81 MG chewable tablet, Chew 81 mg by mouth daily., Disp: , Rfl:    Cholecalciferol (VITAMIN D-3) 1000 units CAPS, Take 1,000 Units by mouth daily., Disp: , Rfl:    cloNIDine (CATAPRES) 0.1 MG tablet, Take 1 tablet (0.1 mg total) by mouth 2 (two) times daily. (Patient not taking: Reported on 07/10/2020), Disp: 60 tablet, Rfl: 11   clopidogrel (PLAVIX) 75 MG tablet, Take 75 mg by mouth daily., Disp: , Rfl:    diltiazem (CARDIZEM CD) 240 MG 24  hr capsule, Take 240 mg by mouth daily. (Patient not taking: Reported on 07/10/2020), Disp: , Rfl: 1   ENSURE PLUS (ENSURE PLUS) LIQD, Take 237 mLs by mouth daily., Disp: , Rfl:    hydrochlorothiazide (HYDRODIURIL) 25 MG tablet, Take 25 mg by mouth daily., Disp: , Rfl:    isosorbide-hydrALAZINE (BIDIL) 20-37.5 MG tablet, Take 1 tablet by mouth 3 (three) times daily. (Patient not taking: Reported on 07/10/2020), Disp: 90 tablet, Rfl: 2   levothyroxine  (SYNTHROID, LEVOTHROID) 50 MCG tablet, Take 50 mcg by mouth daily., Disp: , Rfl: 3   losartan (COZAAR) 100 MG tablet, TAKE ONE TABLET (100 MG DOSE) BY MOUTH ONCE DAILY, Disp: , Rfl:    montelukast (SINGULAIR) 5 MG chewable tablet, Chew 5 mg by mouth at bedtime., Disp: , Rfl:    Multiple Vitamin (MULTIVITAMIN WITH MINERALS) TABS tablet, Take 1 tablet by mouth daily., Disp: , Rfl:    pravastatin (PRAVACHOL) 40 MG tablet, Take 40 mg by mouth daily., Disp: , Rfl: 3   spironolactone (ALDACTONE) 25 MG tablet, Take 12.5 mg by mouth daily., Disp: , Rfl:    vitamin B-12 (CYANOCOBALAMIN) 1000 MCG tablet, Take 1,000 mcg by mouth daily., Disp: , Rfl:    REVIEW OF SYSTEMS:   [X]  denotes positive finding, [ ]  denotes negative finding Cardiac  Comments:  Chest pain or chest pressure:    Shortness of breath upon exertion:    Short of breath when lying flat:    Irregular heart rhythm:        Vascular    Pain in calf, thigh, or hip brought on by ambulation:    Pain in feet at night that wakes you up from your sleep:     Blood clot in your veins:    Leg swelling:         Pulmonary    Oxygen at home:    Productive cough:     Wheezing:         Neurologic    Sudden weakness in arms or legs:     Sudden numbness in arms or legs:     Sudden onset of difficulty speaking or slurred speech:    Temporary loss of vision in one eye:     Problems with dizziness:         Gastrointestinal    Blood in stool:     Vomited blood:         Genitourinary    Burning when urinating:     Blood in urine:        Psychiatric    Major depression:         Hematologic    Bleeding problems:    Problems with blood clotting too easily:        Skin    Rashes or ulcers:        Constitutional    Fever or chills:     , PA-C  Office: [phone] 07/15/2021

## 2023-12-10 DEATH — deceased
